# Patient Record
Sex: Female | Born: 1955
Health system: Southern US, Community
[De-identification: ages and names within clinical notes are randomized; demographics above are authoritative.]

## PROBLEM LIST (undated history)

## (undated) DIAGNOSIS — I700 Atherosclerosis of aorta: Secondary | ICD-10-CM

## (undated) DIAGNOSIS — J432 Centrilobular emphysema: Secondary | ICD-10-CM

## (undated) DIAGNOSIS — F410 Panic disorder [episodic paroxysmal anxiety] without agoraphobia: Secondary | ICD-10-CM

## (undated) DIAGNOSIS — G4733 Obstructive sleep apnea (adult) (pediatric): Secondary | ICD-10-CM

## (undated) DIAGNOSIS — M7989 Other specified soft tissue disorders: Secondary | ICD-10-CM

## (undated) DIAGNOSIS — R0602 Shortness of breath: Secondary | ICD-10-CM

## (undated) DIAGNOSIS — Z720 Tobacco use: Secondary | ICD-10-CM

## (undated) DIAGNOSIS — C50919 Malignant neoplasm of unspecified site of unspecified female breast: Secondary | ICD-10-CM

## (undated) DIAGNOSIS — I5042 Chronic combined systolic (congestive) and diastolic (congestive) heart failure: Secondary | ICD-10-CM

## (undated) DIAGNOSIS — R609 Edema, unspecified: Secondary | ICD-10-CM

## (undated) HISTORY — DX: Tobacco use: Z72.0

## (undated) HISTORY — DX: Other specified soft tissue disorders: M79.89

## (undated) HISTORY — DX: Atherosclerosis of aorta: I70.0

## (undated) HISTORY — DX: Panic disorder (episodic paroxysmal anxiety): F41.0

## (undated) HISTORY — DX: Shortness of breath: R06.02

## (undated) HISTORY — DX: Malignant neoplasm of unspecified site of unspecified female breast: C50.919

## (undated) HISTORY — DX: Centrilobular emphysema: J43.2

## (undated) HISTORY — DX: Edema, unspecified: R60.9

---

## 2005-10-13 ENCOUNTER — Ambulatory Visit: Payer: Self-pay | Admitting: Family Medicine

## 2006-05-14 ENCOUNTER — Encounter (INDEPENDENT_AMBULATORY_CARE_PROVIDER_SITE_OTHER): Payer: Self-pay | Admitting: Specialist

## 2006-05-14 ENCOUNTER — Encounter: Admission: RE | Admit: 2006-05-14 | Discharge: 2006-05-14 | Payer: Self-pay | Admitting: Obstetrics and Gynecology

## 2006-05-14 ENCOUNTER — Encounter (INDEPENDENT_AMBULATORY_CARE_PROVIDER_SITE_OTHER): Payer: Self-pay | Admitting: Radiology

## 2006-05-19 ENCOUNTER — Encounter: Admission: RE | Admit: 2006-05-19 | Discharge: 2006-05-19 | Payer: Self-pay | Admitting: Obstetrics and Gynecology

## 2006-09-22 DIAGNOSIS — C50919 Malignant neoplasm of unspecified site of unspecified female breast: Secondary | ICD-10-CM

## 2006-09-22 HISTORY — PX: BREAST LUMPECTOMY: SHX2

## 2006-09-22 HISTORY — DX: Malignant neoplasm of unspecified site of unspecified female breast: C50.919

## 2009-08-13 ENCOUNTER — Encounter (INDEPENDENT_AMBULATORY_CARE_PROVIDER_SITE_OTHER): Payer: Self-pay | Admitting: *Deleted

## 2009-09-27 ENCOUNTER — Encounter (INDEPENDENT_AMBULATORY_CARE_PROVIDER_SITE_OTHER): Payer: Self-pay | Admitting: *Deleted

## 2009-09-28 ENCOUNTER — Ambulatory Visit: Payer: Self-pay | Admitting: Gastroenterology

## 2009-10-08 ENCOUNTER — Ambulatory Visit: Payer: Self-pay | Admitting: Gastroenterology

## 2009-10-11 ENCOUNTER — Encounter: Payer: Self-pay | Admitting: Gastroenterology

## 2010-09-05 ENCOUNTER — Ambulatory Visit: Payer: Self-pay | Admitting: Internal Medicine

## 2010-09-05 DIAGNOSIS — Z860100 Personal history of colon polyps, unspecified: Secondary | ICD-10-CM | POA: Insufficient documentation

## 2010-09-05 DIAGNOSIS — F17200 Nicotine dependence, unspecified, uncomplicated: Secondary | ICD-10-CM

## 2010-09-05 DIAGNOSIS — Z720 Tobacco use: Secondary | ICD-10-CM | POA: Insufficient documentation

## 2010-09-05 DIAGNOSIS — Z853 Personal history of malignant neoplasm of breast: Secondary | ICD-10-CM | POA: Insufficient documentation

## 2010-09-05 LAB — CONVERTED CEMR LAB
ALT: 22 units/L (ref 0–35)
AST: 24 units/L (ref 0–37)
BUN: 15 mg/dL (ref 6–23)
Basophils Relative: 0.5 % (ref 0.0–3.0)
Bilirubin Urine: NEGATIVE
Bilirubin, Direct: 0.1 mg/dL (ref 0.0–0.3)
Eosinophils Relative: 0.9 % (ref 0.0–5.0)
GFR calc non Af Amer: 73.63 mL/min (ref >60.00)
HCT: 41.2 % (ref 36.0–46.0)
Ketones, ur: NEGATIVE mg/dL
LDL Cholesterol: 101 mg/dL — ABNORMAL HIGH (ref 0–99)
Lymphs Abs: 2 10*3/uL (ref 0.7–4.0)
Monocytes Relative: 9.9 % (ref 3.0–12.0)
Platelets: 192 10*3/uL (ref 150.0–400.0)
Potassium: 4.5 meq/L (ref 3.5–5.1)
RBC: 4.25 M/uL (ref 3.87–5.11)
Sodium: 143 meq/L (ref 135–145)
TSH: 0.85 microintl units/mL (ref 0.35–5.50)
Total Bilirubin: 0.9 mg/dL (ref 0.3–1.2)
Total CHOL/HDL Ratio: 3
Total Protein, Urine: NEGATIVE mg/dL
VLDL: 7.4 mg/dL (ref 0.0–40.0)
WBC: 7.2 10*3/uL (ref 4.5–10.5)
pH: 5.5 (ref 5.0–8.0)

## 2010-10-13 ENCOUNTER — Encounter: Payer: Self-pay | Admitting: Obstetrics and Gynecology

## 2010-10-22 NOTE — Letter (Signed)
Summary: [facility] Instructions  [facility]  [address] [place], [place] [address]   Phone: [phone]  Fax: [phone]       Stacey [name]    1955-12-10    MRN: [id number]        Procedure Day Dorna Bloom:  Duanne Limerick  10/08/09     Arrival Time:  8:00AM     Procedure Time:  9:00AM     Location of Procedure:                    _ X_  [facility] (4th Floor)                        PREPARATION FOR COLONOSCOPY WITH MOVIPREP   Starting 5 days prior to your procedure 10/03/09 do not eat nuts, seeds, popcorn, corn, beans, peas,  salads, or any raw vegetables.  Do not take any fiber supplements (e.g. Metamucil, Citrucel, and Benefiber).  THE DAY BEFORE YOUR PROCEDURE         DATE: 10/07/09  DAY: SUNDAY  1.  Drink clear liquids the entire day-NO SOLID FOOD  2.  Do not drink anything colored red or purple.  Avoid juices with pulp.  No orange juice.  3.  Drink at least 64 oz. (8 glasses) of fluid/clear liquids during the day to prevent dehydration and help the prep work efficiently.  CLEAR LIQUIDS INCLUDE: Water Jello Ice Popsicles Tea (sugar ok, no milk/cream) Powdered fruit flavored drinks Coffee (sugar ok, no milk/cream) Gatorade Juice: apple, white grape, white cranberry  Lemonade Clear bullion, consomm, broth Carbonated beverages (any kind) Strained chicken noodle soup Hard Candy                             4.  In the morning, mix first dose of MoviPrep solution:    Empty 1 Pouch A and 1 Pouch B into the disposable container    Add lukewarm drinking water to the top line of the container. Mix to dissolve    Refrigerate (mixed solution should be used within 24 hrs)  5.  Begin drinking the prep at 5:00 p.m. The MoviPrep container is divided by 4 marks.   Every 15 minutes drink the solution down to the next mark (approximately 8 oz) until the full liter is complete.   6.  Follow completed prep with 16 oz of clear liquid of your choice (Nothing  red or purple).  Continue to drink clear liquids until bedtime.  7.  Before going to bed, mix second dose of MoviPrep solution:    Empty 1 Pouch A and 1 Pouch B into the disposable container    Add lukewarm drinking water to the top line of the container. Mix to dissolve    Refrigerate  THE DAY OF YOUR PROCEDURE      DATE: 10/08/09  DAY: MONDAY  Beginning at 4:00AM (5 hours before procedure):         1. Every 15 minutes, drink the solution down to the next mark (approx 8 oz) until the full liter is complete.  2. Follow completed prep with 16 oz. of clear liquid of your choice.    3. You may drink clear liquids until 7:00AM (2 HOURS BEFORE PROCEDURE).   MEDICATION INSTRUCTIONS  Unless otherwise instructed, you should take regular prescription medications with a small sip of water   as early as possible the morning  of your procedure.         OTHER INSTRUCTIONS  You will need a responsible adult at least 56 years of age to accompany you and drive you home.   This person must remain in the waiting room during your procedure.  Wear loose fitting clothing that is easily removed.  Leave jewelry and other valuables at home.  However, you may wish to bring a book to read or  an iPod/MP3 player to listen to music as you wait for your procedure to start.  Remove all body piercing jewelry and leave at home.  Total time from sign-in until discharge is approximately 2-3 hours.  You should go home directly after your procedure and rest.  You can resume normal activities the  day after your procedure.  The day of your procedure you should not:   Drive   Make legal decisions   Operate machinery   Drink alcohol   Return to work  You will receive specific instructions about eating, activities and medications before you leave.    The above instructions have been reviewed and explained to me by   Wyona Almas RN  September 28, 2009 4:42 PM     I fully understand and can  verbalize these instructions _____________________________ Date _________

## 2010-10-22 NOTE — Procedures (Signed)
Summary: Colonoscopy  Patient: Stacey [name] Note: All result statuses are Final unless otherwise noted.  Tests: (1) Colonoscopy (COL)   COL Colonoscopy           DONE     [facility]     [address]     [place], [place]  [address]           COLONOSCOPY PROCEDURE REPORT           PATIENT:  Stacey [name], Stacey [name]  MR#:  [id number]     BIRTHDATE:  04-Apr-1956, 53 yrs. old  GENDER:  female           ENDOSCOPIST:  Rachael Fee, MD     Referred by:  Cornell Barman, MD           PROCEDURE DATE:  10/08/2009     PROCEDURE:  Colonoscopy with snare polypectomy     ASA CLASS:  Class II     INDICATIONS:  Routine Risk Screening           MEDICATIONS:   Fentanyl 75 mcg IV, Versed 10 mg IV           DESCRIPTION OF PROCEDURE:   After the risks benefits and     alternatives of the procedure were thoroughly explained, informed     consent was obtained.  Digital rectal exam was performed and     revealed no rectal masses.   The LB [id number] endoscope     was introduced through the anus and advanced to the cecum, which     was identified by both the appendix and ileocecal valve, without     limitations.  The quality of the prep was good, using MoviPrep.     The instrument was then slowly withdrawn as the colon was fully     examined.     <<PROCEDUREIMAGES>>           FINDINGS:  There were several small sessile, hyperplastic     appearing recto-sigmoid polyps. These were sampled with cold snare     and sent to pathology (jar 1) (see image3 and image5).  This was     otherwise a normal examination of the colon (see image7, image1,     and image2).   Retroflexed views in the rectum revealed no     abnormalities.    The scope was then withdrawn from the patient     and the procedure completed.           COMPLICATIONS:  None           ENDOSCOPIC IMPRESSION:     1) Several small hyperplastic recto-sigmoid polyps. Sampled with     polypectomy and sent to pathology.     2) Otherwise  normal examination           RECOMMENDATIONS:     1) If the polyp(s) removed today are proven to be adenomatous     (pre-cancerous) polyps, you will need a repeat colonoscopy in 5     years. Otherwise you should continue to follow colorectal cancer     screening guidelines for "routine risk" patients with colonoscopy     in 10 years.     2) You will receive a letter within 1-2 weeks with the results     of your biopsy as well as final recommendations. Please call my     office if you have not received a letter after 3 weeks.  REPEAT EXAM:  await final pathology           ______________________________     Rachael Fee, MD           n.     eSIGNED:   Rachael Fee at 10/08/2009 09:48 AM           Dumlao, Rosalita Chessman, [phone]  Note: An exclamation mark (!) indicates a result that was not dispersed into the flowsheet. Document Creation Date: 10/08/2009 9:48 AM _______________________________________________________________________  (1) Order result status: Final Collection or observation date-time: 10/08/2009 09:44 Requested date-time:  Receipt date-time:  Reported date-time:  Referring Physician:   Ordering Physician: Rob Bunting [phone]) Specimen Source:  Source: Launa Grill Order Number: [id number] Lab site:   Appended Document: Colonoscopy     Procedures Next Due Date:    Colonoscopy: 10/2019

## 2010-10-22 NOTE — Letter (Signed)
Summary: Results Letter  [facility]  [address] [place], [place] [address]   Phone: [phone]  Fax: [phone]        October 11, 2009 MRN: [id number]    Stacey [name] [address] [place], [place]  [address]    Dear Ms. Heims,   Good news.  The polyp(s) that were removed during your recent procedure were NOT pre-cancerous.  You should continue to follow current colorectal cancer screening guidelines with a repeat colonoscopy in 10 years.  We will therefore put your information in our reminder system and will contact you in 10 years to schedule a repeat procedure.  Please call if you have any questions or concerns.       Sincerely,  Rachael Fee MD  This letter has been electronically signed by your physician.  Appended Document: Results Letter Letter mailed 1.21.11

## 2010-10-22 NOTE — Miscellaneous (Signed)
Summary: [facility] Previsit/prep  Clinical Lists Changes  Medications: Added new medication of MOVIPREP 100 GM  SOLR (PEG-KCL-NACL-NASULF-NA ASC-C) As per prep instructions. - Signed Rx of MOVIPREP 100 GM  SOLR (PEG-KCL-NACL-NASULF-NA ASC-C) As per prep instructions.;  #1 x 0;  Signed;  Entered by: Wyona Almas RN;  Authorized by: Rachael Fee MD;  Method used: Electronically to [facility] [address], [place], [place], [place]  [address], Ph: [phone], Fax: [phone] Allergies: Added new allergy or adverse reaction of PENICILLIN Added new allergy or adverse reaction of AMOXICILLIN Observations: Added new observation of NKA: F (09/28/2009 15:41)    Prescriptions: MOVIPREP 100 GM  SOLR (PEG-KCL-NACL-NASULF-NA ASC-C) As per prep instructions.  #1 x 0   Entered by:   Wyona Almas RN   Authorized by:   Rachael Fee MD   Signed by:   Wyona Almas RN on 09/28/2009   Method used:   Electronically to        [facility]* (retail)       [address]       [place], [place]  [address]       Ph: [phone]       Fax: [phone]   RxID:   [id number]

## 2010-10-24 NOTE — Assessment & Plan Note (Signed)
Summary: NEW PT/REQUESTING LABS /BCBS/#/LB   Vital Signs:  Patient profile:   55 year old female Height:      68 inches (172.72 cm) Weight:      165.8 pounds (75.36 kg) BMI:     25.30 O2 Sat:      94 % on Room air Temp:     99.1 degrees F (37.28 degrees C) oral Pulse rate:   67 / minute BP sitting:   112 / 72  (left arm) Cuff size:   regular  Vitals Entered By: Orlan Leavens RMA (September 05, 2010 11:17 AM)  O2 Flow:  Room air CC: New patient Is Patient Diabetic? No Pain Assessment Patient in pain? no        Primary Care Provider:  Newt Lukes MD  CC:  New patient.  History of Present Illness: new pt to me and our practice, here to est care  patient is here today for annual physical. Patient feels well and has no complaints.   concerns however re: scar and deformity of left breast from prior lumpectomy - ?opinion of repair  also c/o cough x 3 weeks  productive in AM - yellow, thick - no fever, mild nasal congestion, no HA or CP +sick contacts, no travel - no weight loss or hemopytsis  ?calcium intake daily and vit d? ?use electric cigarette for cessation  Preventive Screening-Counseling & Management  Alcohol-Tobacco     Alcohol drinks/day: <1     Alcohol Counseling: not indicated; use of alcohol is not excessive or problematic     Smoking Status: current     Smoking Cessation Counseling: yes     Tobacco Counseling: to quit use of tobacco products  Caffeine-Diet-Exercise     Does Patient Exercise: no     Exercise Counseling: to improve exercise regimen     Depression Counseling: not indicated; screening negative for depression  Safety-Violence-Falls     Seat Belt Counseling: not indicated; patient wears seat belts     Helmet Counseling: not indicated; patient wears helmet when riding bicycle/motocycle     Firearm Counseling: not indicated; uses recommended firearm safety measures     Fall Risk Counseling: not indicated; no significant falls  noted  Clinical Review Panels:  Prevention   Last Pap Smear:  Interpretation Result:Negative for intraepithelial Lesion or Malignancy.    (09/22/2009)   Last Colonoscopy:  DONE (10/08/2009)   Current Medications (verified): 1)  Centrum Silver  Tabs (Multiple Vitamins-Minerals) .... Take 1 By Mouth Once Daily 2)  Vitamin D3 2000 Unit Caps (Cholecalciferol) .... Take 1 By Mouth Once Daily 3)  Aspirin 325 Mg Tabs (Aspirin) .... Take 1 By Mouth Once Daily  Allergies (verified): 1)  ! Penicillin 2)  ! 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 I have reviewed all other systems and they were negative.   Physical Exam  General:  alert, well-developed, well-nourished, and cooperative to examination.    Head:  Normocephalic and atraumatic without obvious abnormalities. No apparent alopecia or balding. Eyes:  vision grossly intact; pupils equal, round and reactive to light.  conjunctiva and lids normal.    Ears:  normal pinnae bilaterally, without erythema, swelling, or tenderness to palpation. TMs clear, without effusion, or cerumen impaction. Hearing grossly normal bilaterally  Mouth:  teeth and gums in good repair; mucous membranes moist, without lesions or ulcers. oropharynx clear without exudate, no erythema.  Neck:   supple, full ROM, no masses, no thyromegaly; no thyroid nodules or tenderness. no JVD or carotid bruits.   Breasts:  indention/deficit scar left breast approx 11o'clock from prior lumpectomy Lungs:  normal respiratory effort, no intercostal retractions or use of accessory muscles; few rhonchl breath sounds bilaterally - no crackles and no wheezes.    Heart:  normal rate, regular rhythm, no murmur, and no rub. BLE without edema. Abdomen:  soft, non-tender, normal bowel sounds, no distention; no masses and no appreciable hepatomegaly or splenomegaly.   Genitalia:  defer to gyn Msk:  No deformity or scoliosis noted of thoracic or lumbar spine.   Neurologic:  alert & oriented X3 and cranial nerves II-XII symetrically intact.  strength normal in all extremities, sensation intact to light touch, and gait normal. speech fluent without dysarthria or aphasia; follows commands with good comprehension.  Skin:  no rashes, vesicles, ulcers, or erythema. No nodules or irregularity to palpation.  Psych:  Oriented X3, memory intact for recent and remote, normally interactive, good eye contact, not anxious appearing, not depressed appearing, and not agitated.      Impression & Recommendations:  Problem # 1:  PREVENTIVE HEALTH CARE (ICD-V70.0) Patient has been counseled on age-appropriate routine health concerns for screening and prevention. These are reviewed and up-to-date. Immunizations are up-to-date or declined. Labs ordered and will be reviewed.  Orders: TLB-Lipid Panel (80061-LIPID) TLB-BMP (Basic Metabolic Panel-BMET) (80048-METABOL) TLB-CBC Platelet - w/Differential (85025-CBCD) TLB-Hepatic/Liver Function Pnl (80076-HEPATIC) TLB-TSH (Thyroid Stimulating Hormone) (84443-TSH) TLB-Udip w/ Micro (81001-URINE)  Problem # 2:  ACUTE BRONCHITIS (ICD-466.0)  Her updated medication list for this problem includes:    Azithromycin 250 Mg Tabs (Azithromycin) .Marland Kitchen... 2 tabs by mouth today, then 1 by mouth daily  starting tomorrow  Take antibiotics and other medications as directed. Encouraged to push clear liquids, get enough rest, and take acetaminophen as needed. To be seen in 5-7 days if no improvement, sooner if worse.  Orders: Prescription Created Electronically [phone]) Tobacco use cessation intermediate 3-10 minutes (30865)  Problem # 3:  ADENOCARCINOMA, LEFT BREAST (ICD-174.9) dx 2008 - s/p lumpectomy, chemo and xrt no recurrence on MRI breast 05/2010 - pt concerned about cosmetic scar - given name for plastics surg but uncertain if anything can be done given cancer hx  Problem # 4:  SMOKER (ICD-305.1)  5 minutes today spent on patient education regarding the unhealthy effects of continued tobacco abuse and encouragment of cessation including medical options available to help patient to quit smoking.   Orders: Tobacco use cessation intermediate 3-10 minutes (78469)  Complete Medication List: 1)  Centrum Silver Tabs (Multiple vitamins-minerals) .... Take 1 by mouth once daily 2)  Vitamin D3 2000 Unit Caps (Cholecalciferol) .... Take 1 by mouth once daily 3)  Aspirin 325 Mg Tabs (Aspirin) .... Take 1 by mouth once daily 4)  Valium 5 Mg Tabs (Diazepam) .... 1/2-1 by mouth once daily as needed (per onc) 5)  Caltrate 600 1500 Mg Tabs (  Calcium carbonate) .... 2 by mouth once daily 6)  Azithromycin 250 Mg Tabs (Azithromycin) .... 2 tabs by mouth today, then 1 by mouth daily starting tomorrow  Patient Instructions: 1)  it was good to see you today. 2)  test(s) ordered today - your results will be posted on the phone tree for review in 48-72 hours from the time of test completion; call [phone] and enter your 9 digit MRN (listed above on this page, just below your name); if any changes need to be made or there are abnormal results, you will be contacted directly. 3)  Zpak antibiotics in addition to your cough syrup as discussed -  4)  your prescriptions have been electronically submitted to  your pharmacy. Please take as directed. Contact our office if you believe you're having problems with the medication(s).  5)  Tobacco is very bad for your health and your loved ones! You Should stop smoking!. 6)  Take calcium 1200-1600mg /d  +Vitamin D 1000-2000 units daily. 7)  Call Dr. Benna Dunks [phone] for opinion on your breast concerns 8)  Please schedule a follow-up appointment in annually for your medical physical and labs, call sooner if problems.  Prescriptions: AZITHROMYCIN 250 MG TABS (AZITHROMYCIN) 2 tabs by mouth today, then 1 by mouth daily starting tomorrow  #6 x 0   Entered and Authorized by:   Newt Lukes MD   Signed by:   Newt Lukes MD on 09/05/2010   Method used:   Electronically to        [facility]* (retail)       [address]       [place], [place]  [address]       Ph: [phone]       Fax: [phone]   RxID:   [id number]    Orders Added: 1)  TLB-Lipid Panel [80061-LIPID] 2)  TLB-BMP (Basic Metabolic Panel-BMET) [80048-METABOL] 3)  TLB-CBC Platelet - w/Differential [85025-CBCD] 4)  TLB-Hepatic/Liver Function Pnl [80076-HEPATIC] 5)  TLB-TSH (Thyroid Stimulating Hormone) [84443-TSH] 6)  TLB-Udip w/ Micro [81001-URINE] 7)  Prescription Created Electronically [G8553] 8)  Tobacco use cessation intermediate 3-10 minutes [99406] 9)  New Patient 40-64 years [99386] 6)  New Patient Level II [99202]   Immunization History:  Tetanus/Td Immunization History:    Tetanus/Td:  historical (09/22/2008)   Immunization History:  Tetanus/Td Immunization History:    Tetanus/Td:  Historical (09/22/2008)    Pap Smear  Procedure date:  09/22/2009  Findings:      Interpretation Result:Negative for intraepithelial Lesion or Malignancy.

## 2011-06-20 ENCOUNTER — Ambulatory Visit: Payer: Self-pay | Admitting: Internal Medicine

## 2011-06-20 DIAGNOSIS — Z0289 Encounter for other administrative examinations: Secondary | ICD-10-CM

## 2012-06-16 ENCOUNTER — Ambulatory Visit (INDEPENDENT_AMBULATORY_CARE_PROVIDER_SITE_OTHER)
Admission: RE | Admit: 2012-06-16 | Discharge: 2012-06-16 | Disposition: A | Discharge status: Home / Self Care | Payer: BC Managed Care – PPO | Source: Ambulatory Visit | Attending: Internal Medicine | Admitting: Internal Medicine

## 2012-06-16 ENCOUNTER — Ambulatory Visit (INDEPENDENT_AMBULATORY_CARE_PROVIDER_SITE_OTHER): Payer: BC Managed Care – PPO | Admitting: Internal Medicine

## 2012-06-16 ENCOUNTER — Encounter: Payer: Self-pay | Admitting: Internal Medicine

## 2012-06-16 VITALS — BP 120/80 | HR 100 | Temp 97.2°F | Resp 17 | Wt 177.5 lb

## 2012-06-16 DIAGNOSIS — J309 Allergic rhinitis, unspecified: Secondary | ICD-10-CM

## 2012-06-16 DIAGNOSIS — F17200 Nicotine dependence, unspecified, uncomplicated: Secondary | ICD-10-CM

## 2012-06-16 DIAGNOSIS — J209 Acute bronchitis, unspecified: Secondary | ICD-10-CM

## 2012-06-16 MED ORDER — BENZONATATE 200 MG PO CAPS: 200.0000 mg | ORAL_CAPSULE | Freq: Three times a day (TID) | ORAL | Status: DC | PRN

## 2012-06-16 MED ORDER — LEVOFLOXACIN 500 MG PO TABS: 500.0000 mg | ORAL_TABLET | Freq: Every day | ORAL | Status: DC

## 2012-06-16 MED ORDER — LORATADINE 10 MG PO TABS: 10.0000 mg | ORAL_TABLET | Freq: Every day | ORAL | Status: DC

## 2012-06-16 MED ORDER — METHYLPREDNISOLONE ACETATE 80 MG/ML IJ SUSP
120.0000 mg | Freq: Once | INTRAMUSCULAR | Status: AC
  Administered 2012-06-16: 120 mg via INTRAMUSCULAR

## 2012-06-16 MED ORDER — PHENYLEPH-PROMETHAZINE-COD 5-6.25-10 MG/5ML PO SYRP: 5.0000 mL | ORAL_SOLUTION | Freq: Four times a day (QID) | ORAL | Status: DC | PRN

## 2012-06-16 NOTE — Progress Notes (Signed)
  Subjective:    Patient ID: Stacey [name], female    DOB: Jun 25, 1956, 56 y.o.   MRN: [id number]  Cough This is a new problem. The current episode started 1 to 4 weeks ago. The problem has been unchanged. The problem occurs every few minutes. The cough is productive of sputum. Associated symptoms include headaches, myalgias, nasal congestion, postnasal drip and wheezing. Pertinent negatives include no chest pain, ear congestion, ear pain, fever, rash, sore throat, shortness of breath or weight loss. The symptoms are aggravated by lying down. Risk factors for lung disease include smoking/tobacco exposure. She has tried prescription cough suppressant and OTC cough suppressant for the symptoms. The treatment provided mild relief. Her past medical history is significant for environmental allergies.   Past Medical History  Diagnosis Date  . Breast cancer 2008    L side, s/p lumpectomy, chemotherapy and radiation    Review of Systems  Constitutional: Negative for fever and weight loss.  HENT: Positive for postnasal drip. Negative for ear pain and sore throat.   Respiratory: Positive for cough and wheezing. Negative for shortness of breath.   Cardiovascular: Negative for chest pain.  Musculoskeletal: Positive for myalgias.  Skin: Negative for rash.  Neurological: Positive for headaches.  Hematological: Positive for environmental allergies.       Objective:   Physical Exam BP 120/80  Pulse 100  Temp 97.2 F (36.2 C) (Oral)  Resp 17  Wt 177 lb 8 oz (80.513 kg)  SpO2 92% Wt Readings from Last 3 Encounters:  06/16/12 177 lb 8 oz (80.513 kg)  09/05/10 165 lb 12.8 oz (75.206 kg)   Constitutional: She has coughing spasms, but appears well-developed and well-nourished. No distress.  Neck: Normal range of motion. Neck supple. No JVD present. No thyromegaly present.  Cardiovascular: Normal rate, regular rhythm and normal heart sounds.  No murmur heard. No BLE edema. Pulmonary/Chest: B rhonchi  -Effort normal between cough spells. No resting distress. She has mild exp wheezes.  Neurological: She is alert and oriented to person, place, and time. No cranial nerve deficit. Coordination normal.  Skin: Skin is warm and dry. No rash noted. No erythema.  Psychiatric: She has a normal mood and affect. Her behavior is normal. Judgment and thought content normal.   Lab Results  Component Value Date   WBC 7.2 09/05/2010   HGB 14.1 09/05/2010   HCT 41.2 09/05/2010   PLT 192.0 09/05/2010   GLUCOSE 81 09/05/2010   CHOL 177 09/05/2010   TRIG 37.0 09/05/2010   HDL 68.60 09/05/2010   LDLCALC 101* 09/05/2010   ALT 22 09/05/2010   AST 24 09/05/2010   NA 143 09/05/2010   K 4.5 09/05/2010   CL 103 09/05/2010   CREATININE 0.9 09/05/2010   BUN 15 09/05/2010   CO2 30 09/05/2010   TSH 0.85 09/05/2010        Assessment & Plan:  Acute bronchitis with bronchspasm Smoker allergic rhinitis   Check CXR r/o PNA IM medrol Change antibiotics to Levaquin Change cough medication to tessalon and prometh vc with codiene Start claritin and stop smoking 5 minutes today spent counseling patient on unhealthy effects of continued tobacco abuse and encouragement of cessation including medical options available to help the patient quit smoking.

## 2012-06-16 NOTE — Addendum Note (Signed)
Addended by: Jackson Latino on: 06/16/2012 03:52 PM   Modules accepted: Orders

## 2012-06-16 NOTE — Patient Instructions (Addendum)
It was good to see you today. IM medrol shot for inflammation today Test(s) ordered today. Your results will be released to MyChart (or called to you) after review, usually within 72hours after test completion. If any changes need to be made, you will be notified at that same time. Stop Zpak and start Levaquin antibiotics one pill daily x 7 days Use promethazine with codeine syrup for cough and congestion along with Tessalon perles 3 times a day for cough suppression Continue over-the-counter medications for cough and allergiesAcute Bronchitis Bronchitis is when the organs and tissues involved in breathing get puffy (swollen) and can leak fluid. This makes it harder for air to get in and out of the lungs. You may cough a lot and produce thick spit (mucus). Acute means the illness started suddenly. HOME CARE  Rest.   Drink enough fluids to keep the pee (urine) clear or pale yellow.   Medicines may be given that will open up your airways to help you breathe better. Only take medicine as told by your doctor.   Use a cool mist vaporizer. This will help to thin any thick spit.   Do not smoke. Avoid secondhand smoke.  GET HELP RIGHT AWAY IF:    You have a temperature by mouth above 102 F (38.9 C), not controlled by medicine.   You have chills.   You develop severe shortness of breath or chest pain.   You have bloody spit mixed with mucus (sputum).   You throw up (vomit) often.   You lose too much body fluid (dehydrated).   You have a severe headache.   You feel faint.   You do not improve after 1 week of treatment.  MAKE SURE YOU:    Understand these instructions.   Will watch your condition.   Will get help right away if you are not doing well or get worse.  Document Released: 02/25/2008 Document Revised: 08/28/2011 Document Reviewed: 09/26/2009 [facility] Patient Information 2012 [place], [place].Smoking Cessation, Tips for Success YOU CAN QUIT SMOKING If you are ready to  quit smoking, congratulations! You have chosen to help yourself be healthier. Cigarettes bring nicotine, tar, carbon monoxide, and other irritants into your body. Your lungs, heart, and blood vessels will be able to work better without these poisons. There are many different ways to quit smoking. Nicotine gum, nicotine patches, a nicotine inhaler, or nicotine nasal spray can help with physical craving. Hypnosis, support groups, and medicines help break the habit of smoking. Here are some tips to help you quit for good.  Throw away all cigarettes.   Clean and remove all ashtrays from your home, work, and car.   On a card, write down your reasons for quitting. Carry the card with you and read it when you get the urge to smoke.   Cleanse your body of nicotine. Drink enough water and fluids to keep your urine clear or pale yellow. Do this after quitting to flush the nicotine from your body.   Learn to predict your moods. Do not let a bad situation be your excuse to have a cigarette. Some situations in your life might tempt you into wanting a cigarette.   Never have "just one" cigarette. It leads to wanting another and another. Remind yourself of your decision to quit.   Change habits associated with smoking. If you smoked while driving or when feeling stressed, try other activities to replace smoking. Stand up when drinking your coffee. Brush your teeth after eating. Sit in  a different chair when you read the paper. Avoid alcohol while trying to quit, and try to drink fewer caffeinated beverages. Alcohol and caffeine may urge you to smoke.   Avoid foods and drinks that can trigger a desire to smoke, such as sugary or spicy foods and alcohol.   Ask people who smoke not to smoke around you.   Have something planned to do right after eating or having a cup of coffee. Take a walk or exercise to perk you up. This will help to keep you from overeating.   Try a relaxation exercise to calm you down and  decrease your stress. Remember, you may be tense and nervous for the first 2 weeks after you quit, but this will pass.   Find new activities to keep your hands busy. Play with a pen, coin, or rubber band. Doodle or draw things on paper.   Brush your teeth right after eating. This will help cut down on the craving for the taste of tobacco after meals. You can try mouthwash, too.   Use oral substitutes, such as lemon drops, carrots, a cinnamon stick, or chewing gum, in place of cigarettes. Keep them handy so they are available when you have the urge to smoke.   When you have the urge to smoke, try deep breathing.   Designate your home as a nonsmoking area.   If you are a heavy smoker, ask your caregiver about a prescription for nicotine chewing gum. It can ease your withdrawal from nicotine.   Reward yourself. Set aside the cigarette money you save and buy yourself something nice.   Look for support from others. Join a support group or smoking cessation program. Ask someone at home or at work to help you with your plan to quit smoking.   Always ask yourself, "Do I need this cigarette or is this just a reflex?" Tell yourself, "Today, I choose not to smoke," or "I do not want to smoke." You are reminding yourself of your decision to quit, even if you do smoke a cigarette.  HOW WILL I FEEL WHEN I QUIT SMOKING?  The benefits of not smoking start within days of quitting.   You may have symptoms of withdrawal because your body is used to nicotine (the addictive substance in cigarettes). You may crave cigarettes, be irritable, feel very hungry, cough often, get headaches, or have difficulty concentrating.   The withdrawal symptoms are only temporary. They are strongest when you first quit but will go away within 10 to 14 days.   When withdrawal symptoms occur, stay in control. Think about your reasons for quitting. Remind yourself that these are signs that your body is healing and getting used to  being without cigarettes.   Remember that withdrawal symptoms are easier to treat than the major diseases that smoking can cause.   Even after the withdrawal is over, expect periodic urges to smoke. However, these cravings are generally short-lived and will go away whether you smoke or not. Do not smoke!   If you relapse and smoke again, do not lose hope. Most smokers quit 3 times before they are successful.   If you relapse, do not give up! Plan ahead and think about what you will do the next time you get the urge to smoke.  LIFE AS A NONSMOKER: MAKE IT FOR A MONTH, MAKE IT FOR LIFE Day 1: Hang this page where you will see it every day. Day 2: Get rid of all ashtrays, matches,  and lighters. Day 3: Drink water. Breathe deeply between sips. Day 4: Avoid places with smoke-filled air, such as bars, clubs, or the smoking section of restaurants. Day 5: Keep track of how much money you save by not smoking. Day 6: Avoid boredom. Keep a good book with you or go to the movies. Day 7: Reward yourself! One week without smoking! Day 8: Make a dental appointment to get your teeth cleaned. Day 9: Decide how you will turn down a cigarette before it is offered to you. Day 10: Review your reasons for quitting. Day 11: Distract yourself. Stay active to keep your mind off smoking and to relieve tension. Take a walk, exercise, read a book, do a crossword puzzle, or try a new hobby. Day 12: Exercise. Get off the bus before your stop or use stairs instead of escalators. Day 13: Call on friends for support and encouragement. Day 14: Reward yourself! Two weeks without smoking! Day 15: Practice deep breathing exercises. Day 16: Bet a friend that you can stay a nonsmoker. Day 17: Ask to sit in nonsmoking sections of restaurants. Day 18: Hang up "No Smoking" signs. Day 19: Think of yourself as a nonsmoker. Day 20: Each morning, tell yourself you will not smoke. Day 21: Reward yourself! Three weeks without  smoking! Day 22: Think of smoking in negative ways. Remember how it stains your teeth, gives you bad breath, and leaves you short of breath. Day 23: Eat a nutritious breakfast. Day 24:Do not relive your days as a smoker. Day 25: Hold a pencil in your hand when talking on the telephone. Day 26: Tell all your friends you do not smoke. Day 27: Think about how much better food tastes. Day 28: Remember, one cigarette is one too many. Day 29: Take up a hobby that will keep your hands busy. Day 30: Congratulations! One month without smoking! Give yourself a big reward. Your caregiver can direct you to community resources or hospitals for support, which may include:  Group support.   Education.   Hypnosis.   Subliminal therapy.  Document Released: 06/06/2004 Document Revised: 08/28/2011 Document Reviewed: 06/25/2009 [facility] Patient Information 2012 [place], [place].

## 2013-03-14 ENCOUNTER — Ambulatory Visit (INDEPENDENT_AMBULATORY_CARE_PROVIDER_SITE_OTHER): Payer: BC Managed Care – PPO | Admitting: Internal Medicine

## 2013-03-14 ENCOUNTER — Encounter: Payer: Self-pay | Admitting: Internal Medicine

## 2013-03-14 ENCOUNTER — Other Ambulatory Visit (INDEPENDENT_AMBULATORY_CARE_PROVIDER_SITE_OTHER): Payer: BC Managed Care – PPO

## 2013-03-14 VITALS — BP 142/70 | HR 81 | Temp 98.0°F | Wt 182.6 lb

## 2013-03-14 DIAGNOSIS — Z0000 Encounter for general adult medical examination without abnormal findings: Secondary | ICD-10-CM

## 2013-03-14 LAB — HEPATIC FUNCTION PANEL
ALT: 23 U/L (ref 0–35)
AST: 26 U/L (ref 0–37)
Albumin: 3.7 g/dL (ref 3.5–5.2)

## 2013-03-14 LAB — CBC WITH DIFFERENTIAL/PLATELET
Basophils Absolute: 0 10*3/uL (ref 0.0–0.1)
Basophils Relative: 0.2 % (ref 0.0–3.0)
Hemoglobin: 14.2 g/dL (ref 12.0–15.0)
Lymphocytes Relative: 18.6 % (ref 12.0–46.0)
Monocytes Relative: 9 % (ref 3.0–12.0)
Neutro Abs: 4.7 10*3/uL (ref 1.4–7.7)
RBC: 4.32 Mil/uL (ref 3.87–5.11)
RDW: 13.9 % (ref 11.5–14.6)

## 2013-03-14 LAB — URINALYSIS, ROUTINE W REFLEX MICROSCOPIC
Ketones, ur: NEGATIVE
Leukocytes, UA: NEGATIVE
Nitrite: NEGATIVE
Specific Gravity, Urine: <=1.005 (ref 1.000–1.030)
WBC, UA: NONE SEEN (ref 0–?)
pH: 6 (ref 5.0–8.0)

## 2013-03-14 LAB — TSH: TSH: 1.43 u[IU]/mL (ref 0.35–5.50)

## 2013-03-14 LAB — LIPID PANEL
HDL: 84 mg/dL (ref >39.00)
Triglycerides: 49 mg/dL (ref 0.0–149.0)

## 2013-03-14 LAB — BASIC METABOLIC PANEL
Calcium: 9.1 mg/dL (ref 8.4–10.5)
GFR: 68.31 mL/min (ref >60.00)
Sodium: 143 mEq/L (ref 135–145)

## 2013-03-14 MED ORDER — DIAZEPAM 5 MG PO TABS: 5.0000 mg | ORAL_TABLET | Freq: Four times a day (QID) | ORAL | Status: DC | PRN

## 2013-03-14 NOTE — Progress Notes (Signed)
Subjective:    Patient ID: Stacey [name], female    DOB: Jan 18, 1956, 57 y.o.   MRN: [id number]  HPI  Patient presents today for a well visit. Reports having not been seen by a PCP for several years and wishes to touch base and have her lab work evaluated.  Reports minor complaints of fluid "pockets" along the lateral tibia which are non bothersome.  Reports slow skin healing on forearms which she has noticed over the last year.   Past Medical History  Diagnosis Date  . Breast cancer 2008    L side, s/p lumpectomy, chemotherapy and radiation   Family History  Problem Relation Age of Onset  . Diabetes     History  Substance Use Topics  . Smoking status: Never Smoker   . Smokeless tobacco: Not on file  . Alcohol Use: Yes    Review of Systems Constitutional: Negative for fever or weight change.  Respiratory: Negative for cough and shortness of breath.   Cardiovascular: Negative for chest pain or palpitations.  Gastrointestinal: Negative for abdominal pain, no bowel changes.  Musculoskeletal: Negative for gait problem or joint swelling.  Skin: Negative for rash.  Neurological: Negative for dizziness or headache.  No other specific complaints in a complete review of systems (except as listed in HPI above).         Drug Allergies:  Allergies  Allergen Reactions  . Amoxicillin     REACTION: rash  . Penicillins     REACTION: rash     Objective:   Physical Exam BP 142/70  Pulse 81  Temp(Src) 98 F (36.7 C) (Oral)  Wt 182 lb 9.6 oz (82.827 kg)  BMI 27.77 kg/m2  SpO2 96% Wt Readings from Last 3 Encounters:  03/14/13 182 lb 9.6 oz (82.827 kg)  06/16/12 177 lb 8 oz (80.513 kg)  09/05/10 165 lb 12.8 oz (75.206 kg)   Constitutional: She appears well-developed and well-nourished. No distress.  HENT: Head: Normocephalic and atraumatic. Ears: B TMs ok, no erythema or effusion; Nose: Nose normal. Mouth/Throat: Oropharynx is clear and moist. No oropharyngeal exudate.   Eyes: Conjunctivae and EOM are normal. Pupils are equal, round, and reactive to light. No scleral icterus.  Neck: Normal range of motion. Neck supple. No JVD present. No thyromegaly present.  Cardiovascular: Normal rate, regular rhythm and normal heart sounds.  No murmur heard. No BLE edema. Mild varicose veins distal BLE Pulmonary/Chest: Effort normal and breath sounds normal. No respiratory distress. She has no wheezes.  Abdominal: Soft. Bowel sounds are normal. She exhibits no distension. There is no tenderness. no masses Musculoskeletal: Normal range of motion, no joint effusions. No gross deformities Neurological: She is alert and oriented to person, place, and time. No cranial nerve deficit. Coordination, balance, strength, speech and gait are normal.  Skin: Skin is warm and dry. No rash noted. No erythema.  Psychiatric: She has a normal mood and affect. Her behavior is normal. Judgment and thought content normal.   Lab Results  Component Value Date   WBC 6.5 03/14/2013   HGB 14.2 03/14/2013   HCT 41.8 03/14/2013   PLT 190.0 03/14/2013   GLUCOSE 118* 03/14/2013   CHOL 188 03/14/2013   TRIG 49.0 03/14/2013   HDL 84.00 03/14/2013   LDLCALC 94 03/14/2013   ALT 23 03/14/2013   AST 26 03/14/2013   NA 143 03/14/2013   K 4.5 03/14/2013   CL 109 03/14/2013   CREATININE 0.9 03/14/2013   BUN 17 03/14/2013   CO2  28 03/14/2013   TSH 1.43 03/14/2013       Assessment & Plan:  CPX/v70.0 - Patient has been counseled on age-appropriate routine health concerns for screening and prevention. These are reviewed and up-to-date. Immunizations are up-to-date or declined. Labs ordered and reviewed.  Overweight - The patient is asked to make an attempt to improve diet and exercise patterns to aid in medical management of this problem.    Patient appears to be doing well at today's visit. Encouraged healthy weight loss ideas like moderate aerobic exercises and healthy eating. Educated that "slow healing" is  likely due to aging, and recommended a vitamin E cream. Will assess annual labs for any abnormalities. Refilled Diazepam prescription and sent to pharmacy. Advised to follow up yearly unless needed sooner.

## 2013-03-14 NOTE — Patient Instructions (Signed)
It was good to see you today. We have reviewed your prior records including labs and tests today Health Maintenance reviewed - all recommended immunizations and age-appropriate screenings are up-to-date. Test(s) ordered today. Your results will be released to MyChart (or called to you) after review, usually within 72hours after test completion. If any changes need to be made, you will be notified at that same time. Medications reviewed and updated, no changes recommended at this time. Refill on medication(s) as discussed today. Please schedule followup in 6-12 months, call sooner if problems.  Health Maintenance, Females A healthy lifestyle and preventative care can promote health and wellness.  Maintain regular health, dental, and eye exams.  Eat a healthy diet. Foods like vegetables, fruits, whole grains, low-fat dairy products, and lean protein foods contain the nutrients you need without too many calories. Decrease your intake of foods high in solid fats, added sugars, and salt. Get information about a proper diet from your caregiver, if necessary.  Regular physical exercise is one of the most important things you can do for your health. Most adults should get at least 150 minutes of moderate-intensity exercise (any activity that increases your heart rate and causes you to sweat) each week. In addition, most adults need muscle-strengthening exercises on 2 or more days a week.   Maintain a healthy weight. The body mass index (BMI) is a screening tool to identify possible weight problems. It provides an estimate of body fat based on height and weight. Your caregiver can help determine your BMI, and can help you achieve or maintain a healthy weight. For adults 20 years and older:  A BMI below 18.5 is considered underweight.  A BMI of 18.5 to 24.9 is normal.  A BMI of 25 to 29.9 is considered overweight.  A BMI of 30 and above is considered obese.  Maintain normal blood lipids and  cholesterol by exercising and minimizing your intake of saturated fat. Eat a balanced diet with plenty of fruits and vegetables. Blood tests for lipids and cholesterol should begin at age 61 and be repeated every 5 years. If your lipid or cholesterol levels are high, you are over 50, or you are a high risk for heart disease, you may need your cholesterol levels checked more frequently.Ongoing high lipid and cholesterol levels should be treated with medicines if diet and exercise are not effective.  If you smoke, find out from your caregiver how to quit. If you do not use tobacco, do not start.  If you are pregnant, do not drink alcohol. If you are breastfeeding, be very cautious about drinking alcohol. If you are not pregnant and choose to drink alcohol, do not exceed 1 drink per day. One drink is considered to be 12 ounces (355 mL) of beer, 5 ounces (148 mL) of wine, or 1.5 ounces (44 mL) of liquor.  Avoid use of street drugs. Do not share needles with anyone. Ask for help if you need support or instructions about stopping the use of drugs.  High blood pressure causes heart disease and increases the risk of stroke. Blood pressure should be checked at least every 1 to 2 years. Ongoing high blood pressure should be treated with medicines, if weight loss and exercise are not effective.  If you are 41 to 58 years old, ask your caregiver if you should take aspirin to prevent strokes.  Diabetes screening involves taking a blood sample to check your fasting blood sugar level. This should be done once every 3 years,  after age 27, if you are within normal weight and without risk factors for diabetes. Testing should be considered at a younger age or be carried out more frequently if you are overweight and have at least 1 risk factor for diabetes.  Breast cancer screening is essential preventative care for women. You should practice "breast self-awareness." This means understanding the normal appearance and  feel of your breasts and may include breast self-examination. Any changes detected, no matter how small, should be reported to a caregiver. Women in their 47s and 30s should have a clinical breast exam (CBE) by a caregiver as part of a regular health exam every 1 to 3 years. After age 44, women should have a CBE every year. Starting at age 42, women should consider having a mammogram (breast X-ray) every year. Women who have a family history of breast cancer should talk to their caregiver about genetic screening. Women at a high risk of breast cancer should talk to their caregiver about having an MRI and a mammogram every year.  The Pap test is a screening test for cervical cancer. Women should have a Pap test starting at age 42. Between ages 50 and 60, Pap tests should be repeated every 2 years. Beginning at age 32, you should have a Pap test every 3 years as long as the past 3 Pap tests have been normal. If you had a hysterectomy for a problem that was not cancer or a condition that could lead to cancer, then you no longer need Pap tests. If you are between ages 37 and 60, and you have had normal Pap tests going back 10 years, you no longer need Pap tests. If you have had past treatment for cervical cancer or a condition that could lead to cancer, you need Pap tests and screening for cancer for at least 20 years after your treatment. If Pap tests have been discontinued, risk factors (such as a new sexual partner) need to be reassessed to determine if screening should be resumed. Some women have medical problems that increase the chance of getting cervical cancer. In these cases, your caregiver may recommend more frequent screening and Pap tests.  The human papillomavirus (HPV) test is an additional test that may be used for cervical cancer screening. The HPV test looks for the virus that can cause the cell changes on the cervix. The cells collected during the Pap test can be tested for HPV. The HPV test could  be used to screen women aged 42 years and older, and should be used in women of any age who have unclear Pap test results. After the age of 44, women should have HPV testing at the same frequency as a Pap test.  Colorectal cancer can be detected and often prevented. Most routine colorectal cancer screening begins at the age of 27 and continues through age 80. However, your caregiver may recommend screening at an earlier age if you have risk factors for colon cancer. On a yearly basis, your caregiver may provide home test kits to check for hidden blood in the stool. Use of a small camera at the end of a tube, to directly examine the colon (sigmoidoscopy or colonoscopy), can detect the earliest forms of colorectal cancer. Talk to your caregiver about this at age 64, when routine screening begins. Direct examination of the colon should be repeated every 5 to 10 years through age 53, unless early forms of pre-cancerous polyps or small growths are found.  Hepatitis C blood testing  is recommended for all people born from [birth year] through 1966 and any individual with known risks for hepatitis C.  Practice safe sex. Use condoms and avoid high-risk sexual practices to reduce the spread of sexually transmitted infections (STIs). Sexually active women aged 50 and younger should be checked for Chlamydia, which is a common sexually transmitted infection. Older women with new or multiple partners should also be tested for Chlamydia. Testing for other STIs is recommended if you are sexually active and at increased risk.  Osteoporosis is a disease in which the bones lose minerals and strength with aging. This can result in serious bone fractures. The risk of osteoporosis can be identified using a bone density scan. Women ages 31 and over and women at risk for fractures or osteoporosis should discuss screening with their caregivers. Ask your caregiver whether you should be taking a calcium supplement or vitamin D to reduce the  rate of osteoporosis.  Menopause can be associated with physical symptoms and risks. Hormone replacement therapy is available to decrease symptoms and risks. You should talk to your caregiver about whether hormone replacement therapy is right for you.  Use sunscreen with a sun protection factor (SPF) of 30 or greater. Apply sunscreen liberally and repeatedly throughout the day. You should seek shade when your shadow is shorter than you. Protect yourself by wearing long sleeves, pants, a wide-brimmed hat, and sunglasses year round, whenever you are outdoors.  Notify your caregiver of new moles or changes in moles, especially if there is a change in shape or color. Also notify your caregiver if a mole is larger than the size of a pencil eraser.  Stay current with your immunizations. Document Released: 03/24/2011 Document Revised: 12/01/2011 Document Reviewed: 03/24/2011 [facility] Patient Information 2014 [place], [place].

## 2013-11-15 ENCOUNTER — Ambulatory Visit: Payer: BC Managed Care – PPO | Admitting: Internal Medicine

## 2013-12-12 ENCOUNTER — Other Ambulatory Visit: Payer: Self-pay | Admitting: *Deleted

## 2013-12-12 MED ORDER — DIAZEPAM 5 MG PO TABS: 5.0000 mg | ORAL_TABLET | Freq: Four times a day (QID) | ORAL | Status: DC | PRN

## 2013-12-12 NOTE — Telephone Encounter (Signed)
Faxed script back to [facility]...Johny Chess

## 2014-01-20 ENCOUNTER — Ambulatory Visit: Payer: BC Managed Care – PPO | Admitting: Internal Medicine

## 2015-01-23 ENCOUNTER — Encounter: Payer: Self-pay | Admitting: Internal Medicine

## 2015-01-23 ENCOUNTER — Ambulatory Visit (INDEPENDENT_AMBULATORY_CARE_PROVIDER_SITE_OTHER): Payer: BLUE CROSS/BLUE SHIELD | Admitting: Internal Medicine

## 2015-01-23 ENCOUNTER — Other Ambulatory Visit (INDEPENDENT_AMBULATORY_CARE_PROVIDER_SITE_OTHER): Payer: BLUE CROSS/BLUE SHIELD

## 2015-01-23 ENCOUNTER — Ambulatory Visit (INDEPENDENT_AMBULATORY_CARE_PROVIDER_SITE_OTHER)
Admission: RE | Admit: 2015-01-23 | Discharge: 2015-01-23 | Disposition: A | Discharge status: Home / Self Care | Payer: BLUE CROSS/BLUE SHIELD | Source: Ambulatory Visit | Attending: Internal Medicine | Admitting: Internal Medicine

## 2015-01-23 VITALS — BP 118/82 | HR 95 | Temp 98.7°F | Ht 68.0 in | Wt 178.0 lb

## 2015-01-23 DIAGNOSIS — R059 Cough, unspecified: Secondary | ICD-10-CM

## 2015-01-23 DIAGNOSIS — M2550 Pain in unspecified joint: Secondary | ICD-10-CM

## 2015-01-23 DIAGNOSIS — F4322 Adjustment disorder with anxiety: Secondary | ICD-10-CM

## 2015-01-23 DIAGNOSIS — J3089 Other allergic rhinitis: Secondary | ICD-10-CM

## 2015-01-23 DIAGNOSIS — R05 Cough: Secondary | ICD-10-CM

## 2015-01-23 LAB — CBC WITH DIFFERENTIAL/PLATELET
BASOS PCT: 0.4 % (ref 0.0–3.0)
Basophils Absolute: 0 10*3/uL (ref 0.0–0.1)
EOS ABS: 0 10*3/uL (ref 0.0–0.7)
Eosinophils Relative: 0.5 % (ref 0.0–5.0)
HEMATOCRIT: 45 % (ref 36.0–46.0)
HEMOGLOBIN: 15.5 g/dL — AB (ref 12.0–15.0)
LYMPHS ABS: 1.7 10*3/uL (ref 0.7–4.0)
LYMPHS PCT: 21.8 % (ref 12.0–46.0)
MCHC: 34.5 g/dL (ref 30.0–36.0)
MCV: 93.3 fl (ref 78.0–100.0)
Monocytes Absolute: 0.6 10*3/uL (ref 0.1–1.0)
Monocytes Relative: 8.3 % (ref 3.0–12.0)
NEUTROS ABS: 5.3 10*3/uL (ref 1.4–7.7)
Neutrophils Relative %: 69 % (ref 43.0–77.0)
Platelets: 205 10*3/uL (ref 150.0–400.0)
RBC: 4.82 Mil/uL (ref 3.87–5.11)
RDW: 14.5 % (ref 11.5–15.5)
WBC: 7.7 10*3/uL (ref 4.0–10.5)

## 2015-01-23 LAB — TSH: TSH: 0.99 u[IU]/mL (ref 0.35–4.50)

## 2015-01-23 LAB — HEPATIC FUNCTION PANEL
ALT: 22 U/L (ref 0–35)
AST: 23 U/L (ref 0–37)
Albumin: 4 g/dL (ref 3.5–5.2)
Alkaline Phosphatase: 66 U/L (ref 39–117)
BILIRUBIN DIRECT: 0.1 mg/dL (ref 0.0–0.3)
BILIRUBIN TOTAL: 0.8 mg/dL (ref 0.2–1.2)
TOTAL PROTEIN: 7.1 g/dL (ref 6.0–8.3)

## 2015-01-23 LAB — BASIC METABOLIC PANEL
BUN: 16 mg/dL (ref 6–23)
CHLORIDE: 104 meq/L (ref 96–112)
CO2: 30 meq/L (ref 19–32)
Calcium: 9.7 mg/dL (ref 8.4–10.5)
Creatinine, Ser: 0.91 mg/dL (ref 0.40–1.20)
GFR: 67.02 mL/min (ref >60.00)
GLUCOSE: 98 mg/dL (ref 70–99)
POTASSIUM: 4.7 meq/L (ref 3.5–5.1)
SODIUM: 141 meq/L (ref 135–145)

## 2015-01-23 LAB — RHEUMATOID FACTOR: Rhuematoid fact SerPl-aCnc: 25 IU/mL — ABNORMAL HIGH (ref <=14)

## 2015-01-23 LAB — SEDIMENTATION RATE: Sed Rate: 9 mm/hr (ref 0–22)

## 2015-01-23 LAB — T4, FREE: FREE T4: 0.84 ng/dL (ref 0.60–1.60)

## 2015-01-23 MED ORDER — MONTELUKAST SODIUM 10 MG PO TABS: 10.0000 mg | ORAL_TABLET | Freq: Every day | ORAL | Status: DC

## 2015-01-23 MED ORDER — CITALOPRAM HYDROBROMIDE 20 MG PO TABS: 20.0000 mg | ORAL_TABLET | Freq: Every day | ORAL | Status: DC

## 2015-01-23 NOTE — Patient Instructions (Signed)
Plain Mucinex (NOT D) for thick secretions ;force NON dairy fluids .   Nasal cleansing in the shower as discussed with lather of mild shampoo.After 10 seconds wash off lather while  exhaling through nostrils. Make sure that all residual soap is removed to prevent irritation.  Flonase OR Nasacort AQ 1 spray in each nostril twice a day as needed. Use the "crossover" technique into opposite nostril spraying toward opposite ear @ 45 degree angle, not straight up into nostril. Fill the  prescription for Singulair it there is not dramatic improvement in the next 48 hours.   Your next office appointment will be determined based upon review of your pending labs  and  xrays Those instructions will be transmitted to you by mail for your records. Critical results will be called. Followup as needed for any active or acute issue. Please report any significant change in your symptoms.  Please reconsider taking the agent to raise the neurotransmitters which are essential for good brain function, both intellectual & emotional health. These agents are not addictive and simply keep this essential neurotransmitter at therapeutic levels. If these levels become severely depleted; depression or panic attacks can occur.

## 2015-01-23 NOTE — Progress Notes (Signed)
   Subjective:    Patient ID: Stacey [name], female    DOB: Oct 30, 1955, 59 y.o.   MRN: [id number]  HPI She is here today because she's worried she has cancer of bones and lymph nodes .She sees Dr. Rhona Raider with [facility]. She states I feel "it's bad luck to walk into that oncology building". She states that she "lives in fear". She brings in labs done by Dr. Rhona Raider 08/09/14. These included a CBC and differential and comprehensive metabolic profile. She was concerned that the granulocyte percentage was 80.3, lymphocytes 13.5, and basophil 0.0%. She states Dr. Rhona Raider had assured her this was of no concern but she still has fear about changes indicating cancer in the bones.  She's having pain in the left shoulder with range of motion activities such as putting on her seatbelt or scrubbing her back. She has pain in her fingers intermittently as well as some pain in the left knee. Left knee also buckles  She has easy bruising simply  as hose cleaning.  She has had a cough for 4 months in the context of extrinsic symptoms with postnasal drainage and itchy/watery eyes. She has occasional shortness breath or wheezing. She's been 1 pack a day smoker ;but she states she's using an electronic cigarette now. She's used Claritin and Zyrtec for the extrinsic symptoms but these caused metal status changes. She denies associated upper respiratory tract infection symptoms   Review of Systems Frontal headache, facial pain , nasal purulence, dental pain, sore throat , otic pain or otic discharge denied. No fever , chills or sweats.    Objective:   Physical Exam  Pertinent or positive findings include:  Slight proptosis & lid lag.She has bilateral pterygia medially. There is an upper dental plate.  She has erythema of the oropharynx.  An S4 is noted.  Breath sounds are decreased.  She has minimal isolated DJD changes in the hands. There is minimal flexion contracture the fifth  fingers bilaterally. She's markedly anxious and intermittently tearful.  General appearance :adequately nourished; in no distress. Eyes: No conjunctival inflammation or scleral icterus is present. Oral exam:  Lips and gums are healthy appearing.There is no oropharyngeal erythema or exudate noted. Heart:  Normal rate and regular rhythm. S1 and S2 normal without gallop, murmur, click, or rub . Lungs:Chest clear to auscultation; no wheezes, rhonchi,rales ,or rubs present.No increased work of breathing.  Abdomen: bowel sounds normal, soft and non-tender without masses, organomegaly or hernias noted.  No guarding or rebound.  Vascular : all pulses equal ; no bruits present. Skin:Warm & dry.  Intact without suspicious lesions or rashes ; no tenting or jaundice  Lymphatic: No lymphadenopathy is noted about the head, neck, axilla Neuro: Strength, tone & DTRs normal.        Assessment & Plan:  #1 cancerphobia in the context of having had breast cancer  #2 allergic rhinitis   #3 cough in the context of #2 and continued nicotine use  #4 Minor degenerative joint disease  See orders. The pathophysiology of neurotransmitter deficiency was discussed along with the benefits and potential adverse effects of SSRI therapy.

## 2015-01-23 NOTE — Progress Notes (Signed)
Pre visit review using our clinic review tool, if applicable. No additional management support is needed unless otherwise documented below in the visit note. 

## 2015-01-24 ENCOUNTER — Telehealth: Payer: Self-pay

## 2015-01-24 LAB — CYCLIC CITRUL PEPTIDE ANTIBODY, IGG: Cyclic Citrullin Peptide Ab: <2 U/mL (ref 0.0–5.0)

## 2015-01-24 NOTE — Telephone Encounter (Signed)
Done

## 2015-01-24 NOTE — Telephone Encounter (Signed)
-----   Message from Hendricks Limes, MD sent at 01/24/2015  8:21 AM EDT ----- Please FAX office visit ,Xray & lab results to Dr Rhona Raider , [facility]. Thanks

## 2015-01-29 ENCOUNTER — Telehealth: Payer: Self-pay

## 2015-01-29 NOTE — Telephone Encounter (Signed)
Phone call from patient requesting xray results. She has been advised and another copy mailed since she states she did not get the first copy

## 2015-02-05 ENCOUNTER — Encounter: Payer: Self-pay | Admitting: Internal Medicine

## 2015-11-07 ENCOUNTER — Encounter: Payer: Self-pay | Admitting: Gastroenterology

## 2015-11-13 ENCOUNTER — Ambulatory Visit (INDEPENDENT_AMBULATORY_CARE_PROVIDER_SITE_OTHER): Payer: BLUE CROSS/BLUE SHIELD | Admitting: Family Medicine

## 2015-11-13 ENCOUNTER — Encounter: Payer: Self-pay | Admitting: Family Medicine

## 2015-11-13 VITALS — BP 123/81 | HR 79 | Temp 98.1°F | Wt 185.0 lb

## 2015-11-13 DIAGNOSIS — F17200 Nicotine dependence, unspecified, uncomplicated: Secondary | ICD-10-CM

## 2015-11-13 DIAGNOSIS — H109 Unspecified conjunctivitis: Secondary | ICD-10-CM | POA: Diagnosis not present

## 2015-11-13 DIAGNOSIS — J209 Acute bronchitis, unspecified: Secondary | ICD-10-CM

## 2015-11-13 MED ORDER — AZITHROMYCIN 250 MG PO TABS: ORAL_TABLET | ORAL | Status: DC

## 2015-11-13 MED ORDER — PROMETHAZINE-DM 6.25-15 MG/5ML PO SYRP: 5.0000 mL | ORAL_SOLUTION | Freq: Four times a day (QID) | ORAL | Status: DC | PRN

## 2015-11-13 MED ORDER — MOXIFLOXACIN HCL 0.5 % OP SOLN: 1.0000 [drp] | Freq: Three times a day (TID) | OPHTHALMIC | Status: DC

## 2015-11-13 NOTE — Progress Notes (Signed)
Pre visit review using our clinic review tool, if applicable. No additional management support is needed unless otherwise documented below in the visit note. 

## 2015-11-13 NOTE — Patient Instructions (Signed)

## 2015-11-13 NOTE — Progress Notes (Signed)
  Subjective:     Stacey [name] is a 60 y.o. female who presents for evaluation of symptoms of a URI. Symptoms include congestion, facial pain, nasal congestion, non productive cough, post nasal drip, sinus pressure, sore throat and d/c R eye and crusted shut in am. Onset of symptoms was 2 months ago, and has been rapidly worsening since that time. Treatment to date: nasal steroids.  The following portions of the patient's history were reviewed and updated as appropriate:  She  has a past medical history of Breast cancer ([place]) (2008). She  does not have any pertinent problems on file. She  has past surgical history that includes Breast lumpectomy (2008). Her family history is not on file. She  reports that she has been smoking Cigarettes.  She has a 67.5 pack-year smoking history. She does not have any smokeless tobacco history on file. She reports that she drinks alcohol. She reports that she does not use illicit drugs. She has a current medication list which includes the following prescription(s): aspirin, calcium-magnesium-vitamin d, vitamin d-3, diazepam, montelukast, centrum silver, turmeric, ubiquinol, vitamin c, azithromycin, moxifloxacin, and promethazine-dextromethorphan. Current Outpatient Prescriptions on File Prior to Visit  Medication Sig Dispense Refill  . aspirin 81 MG tablet Take 81 mg by mouth daily.    . Calcium-Magnesium-Vitamin D (CALCIUM 1200+D3) 600-40-500 MG-MG-UNIT TB24 Take by mouth daily.    . Cholecalciferol (VITAMIN D-3) 1000 UNITS CAPS Take by mouth daily.    . diazepam (VALIUM) 5 MG tablet Take 1 tablet (5 mg total) by mouth every 6 (six) hours as needed for anxiety. 30 tablet 2  . montelukast (SINGULAIR) 10 MG tablet Take 1 tablet (10 mg total) by mouth at bedtime. 30 tablet 3  . Multiple Vitamins-Minerals (CENTRUM SILVER) tablet Take 1 tablet by mouth daily.    . Turmeric 500 MG CAPS Take 500 mg by mouth daily.    Marland Kitchen Ubiquinol 100 MG CAPS Take by mouth daily.    .  vitamin C (ASCORBIC ACID) 500 MG tablet Take 500 mg by mouth daily.     No current facility-administered medications on file prior to visit.   She is allergic to amoxicillin and penicillins..  Review of Systems Pertinent items are noted in HPI.   Objective:    BP 123/81 mmHg  Pulse 79  Temp(Src) 98.1 F (36.7 C) (Oral)  Wt 185 lb (83.915 kg)  SpO2 95% General appearance: alert, cooperative, appears stated age and no distress Head: Normocephalic, without obvious abnormality, atraumatic Eyes: positive findings: conjunctiva: 2+ injection, + d/c Ears: normal TM's and external ear canals both ears Nose: green discharge, moderate congestion, turbinates red, swollen, sinus tenderness bilateral Throat: abnormal findings: mild oropharyngeal erythema Neck: mild anterior cervical adenopathy, supple, symmetrical, trachea midline and thyroid not enlarged, symmetric, no tenderness/mass/nodules Lungs: clear to auscultation bilaterally Heart: S1, S2 normal   Assessment:    sinusitis   Plan:    Discussed the diagnosis and treatment of sinusitis. Suggested symptomatic OTC remedies. Nasal saline spray for congestion. Zithromax per orders. Nasal steroids per orders. Follow up as needed. vigamox for eyes

## 2015-12-03 ENCOUNTER — Telehealth: Payer: Self-pay | Admitting: Acute Care

## 2015-12-03 DIAGNOSIS — F17210 Nicotine dependence, cigarettes, uncomplicated: Principal | ICD-10-CM

## 2015-12-03 NOTE — Telephone Encounter (Signed)
Received Lung Screening Referral from Dr. Etter Sjogren. Spoke with pt; discussed Lung Screening Program and Questionnaire.  Pt does meet criteria for program.   Pt states she will need to know whether or not this will be covered by her insurance before she is able to schedule apt.  She does believe she has a deductible that has not been met and co-insurance. Advised order will be placed for scan to be Precerted with insurance.  Once precert complete, we would contact her with update.  Pt verbalized understanding and is aware this process could take approx 7 days.  She voiced no further questions or concerns at this time and very grateful for our help.    Rodena Piety, please let me know once precert is complete so we can discuss with pt and schedule SDMV if pt would like to proceed.  Thank you!

## 2015-12-05 NOTE — Telephone Encounter (Signed)
I called the patients insurance company and found out that none of her deductible has been met. I called the patient and told her what I had found out and that for the CT Scan it would be 650.00 out of pocket. I advised her to call the insurance company and ask how much they would help pay for the CT and call us back if it was more reasonable then the 650.00. She stated she would call the insurance herself and call me back to let me know what she has decided

## 2015-12-17 NOTE — Telephone Encounter (Signed)
Called spoke with patient. She reports she is working with her insurance still and will call us to schedule once this is taken care of.

## 2016-01-15 ENCOUNTER — Encounter: Payer: Self-pay | Admitting: Internal Medicine

## 2016-01-15 ENCOUNTER — Ambulatory Visit (INDEPENDENT_AMBULATORY_CARE_PROVIDER_SITE_OTHER): Payer: BLUE CROSS/BLUE SHIELD | Admitting: Internal Medicine

## 2016-01-15 ENCOUNTER — Other Ambulatory Visit (INDEPENDENT_AMBULATORY_CARE_PROVIDER_SITE_OTHER): Payer: BLUE CROSS/BLUE SHIELD

## 2016-01-15 VITALS — BP 124/80 | HR 85 | Temp 98.1°F | Ht 68.0 in | Wt 184.0 lb

## 2016-01-15 DIAGNOSIS — Z1159 Encounter for screening for other viral diseases: Secondary | ICD-10-CM

## 2016-01-15 DIAGNOSIS — G43109 Migraine with aura, not intractable, without status migrainosus: Secondary | ICD-10-CM

## 2016-01-15 DIAGNOSIS — F17200 Nicotine dependence, unspecified, uncomplicated: Secondary | ICD-10-CM

## 2016-01-15 DIAGNOSIS — G43909 Migraine, unspecified, not intractable, without status migrainosus: Secondary | ICD-10-CM | POA: Insufficient documentation

## 2016-01-15 DIAGNOSIS — Z0000 Encounter for general adult medical examination without abnormal findings: Secondary | ICD-10-CM

## 2016-01-15 DIAGNOSIS — Z853 Personal history of malignant neoplasm of breast: Secondary | ICD-10-CM

## 2016-01-15 LAB — COMPREHENSIVE METABOLIC PANEL
ALBUMIN: 4 g/dL (ref 3.5–5.2)
ALT: 19 U/L (ref 0–35)
AST: 20 U/L (ref 0–37)
Alkaline Phosphatase: 59 U/L (ref 39–117)
BUN: 16 mg/dL (ref 6–23)
CHLORIDE: 105 meq/L (ref 96–112)
CO2: 30 meq/L (ref 19–32)
CREATININE: 0.88 mg/dL (ref 0.40–1.20)
Calcium: 9.3 mg/dL (ref 8.4–10.5)
GFR: 69.43 mL/min (ref >60.00)
Glucose, Bld: 111 mg/dL — ABNORMAL HIGH (ref 70–99)
Potassium: 4.8 mEq/L (ref 3.5–5.1)
SODIUM: 141 meq/L (ref 135–145)
Total Bilirubin: 0.7 mg/dL (ref 0.2–1.2)
Total Protein: 6.8 g/dL (ref 6.0–8.3)

## 2016-01-15 LAB — CBC
HCT: 45 % (ref 36.0–46.0)
HEMOGLOBIN: 15.1 g/dL — AB (ref 12.0–15.0)
MCHC: 33.5 g/dL (ref 30.0–36.0)
MCV: 94.1 fl (ref 78.0–100.0)
Platelets: 197 10*3/uL (ref 150.0–400.0)
RBC: 4.78 Mil/uL (ref 3.87–5.11)
RDW: 14.4 % (ref 11.5–15.5)
WBC: 6.2 10*3/uL (ref 4.0–10.5)

## 2016-01-15 LAB — TSH: TSH: 1.15 u[IU]/mL (ref 0.35–4.50)

## 2016-01-15 LAB — LIPID PANEL
Cholesterol: 200 mg/dL (ref 0–200)
HDL: 75.7 mg/dL (ref >39.00)
LDL Cholesterol: 113 mg/dL — ABNORMAL HIGH (ref 0–99)
NONHDL: 124.09
Total CHOL/HDL Ratio: 3
Triglycerides: 56 mg/dL (ref 0.0–149.0)
VLDL: 11.2 mg/dL (ref 0.0–40.0)

## 2016-01-15 LAB — HEPATITIS C ANTIBODY: HCV AB: NEGATIVE

## 2016-01-15 MED ORDER — DIAZEPAM 5 MG PO TABS: 5.0000 mg | ORAL_TABLET | Freq: Every day | ORAL | Status: DC | PRN

## 2016-01-15 NOTE — Progress Notes (Signed)
Pre visit review using our clinic review tool, if applicable. No additional management support is needed unless otherwise documented below in the visit note. 

## 2016-01-15 NOTE — Assessment & Plan Note (Signed)
She is still smoking 1.5 PPD and does not desire to quit. Does not want to get lung cancer screening at this time.

## 2016-01-15 NOTE — Assessment & Plan Note (Signed)
Declines need for medication. Offered MRI brain to her for this new pattern of migraines but she needs to think about it. She is not sure she can do the MRI.

## 2016-01-15 NOTE — Progress Notes (Signed)
   Subjective:    Patient ID: Stacey [name], female    DOB: 1956-05-31, 61 y.o.   MRN: [id number]  HPI The patient is a 60 YO female coming in for wellness. She is having several concerns and the biggest is recurrent migraines. She had them severely in her teens and they were never evaluated. She is getting them every other day now and they cause aura and vision changes and some numbness on her tongue. She takes 3 ibuprofen and this generally helps. They can be debilitating and she cannot drive or work during episodes. She is very concerned about cancer due to her history of breast cancer.   PMH, [facility], social history reviewed and updated.   Review of Systems  Constitutional: Positive for activity change. Negative for fever, appetite change, fatigue and unexpected weight change.       Not exercising at all  HENT: Negative for congestion, postnasal drip, rhinorrhea, sinus pressure and trouble swallowing.   Eyes: Negative.   Respiratory: Negative for cough, chest tightness, shortness of breath and wheezing.   Cardiovascular: Negative for chest pain, palpitations and leg swelling.  Gastrointestinal: Negative for nausea, abdominal pain, diarrhea, constipation and abdominal distention.  Musculoskeletal: Negative.   Skin: Negative.   Neurological: Positive for headaches.  Psychiatric/Behavioral: Positive for dysphoric mood and agitation. Negative for sleep disturbance and self-injury. The patient is not nervous/anxious.       Objective:   Physical Exam  Constitutional: She is oriented to person, place, and time. She appears well-developed and well-nourished.  HENT:  Head: Normocephalic and atraumatic.  Eyes: EOM are normal. Pupils are equal, round, and reactive to light.  Neck: Normal range of motion.  Cardiovascular: Normal rate and regular rhythm.   Pulmonary/Chest: Effort normal and breath sounds normal. No respiratory distress. She has no wheezes. She has no rales.  Abdominal: Soft. Bowel  sounds are normal. She exhibits no distension. There is no tenderness. There is no rebound.  Musculoskeletal: She exhibits no edema.  Neurological: She is alert and oriented to person, place, and time. Coordination normal.  Skin: Skin is warm and dry.  Psychiatric: She has a normal mood and affect.   Filed Vitals:   01/15/16 0819  BP: 124/80  Pulse: 85  Temp: 98.1 F (36.7 C)  TempSrc: Oral  Height: 5\' 8"  (1.727 m)  Weight: 184 lb (83.462 kg)  SpO2: 94%      Assessment & Plan:

## 2016-01-15 NOTE — Assessment & Plan Note (Signed)
Gets yearly 3D mammogram.

## 2016-01-15 NOTE — Patient Instructions (Signed)
We are checking the labs today and will send the results on my chart.   Think about whether you want to do the MRI of the brain. I think this is a reasonable option and if you are worried we can do the open and have you take part of a valium before the test.   Health Maintenance, Female Adopting a healthy lifestyle and getting preventive care can go a long way to promote health and wellness. Talk with your health care provider about what schedule of regular examinations is right for you. This is a good chance for you to check in with your provider about disease prevention and staying healthy. In between checkups, there are plenty of things you can do on your own. Experts have done a lot of research about which lifestyle changes and preventive measures are most likely to keep you healthy. Ask your health care provider for more information. WEIGHT AND DIET  Eat a healthy diet  Be sure to include plenty of vegetables, fruits, low-fat dairy products, and lean protein.  Do not eat a lot of foods high in solid fats, added sugars, or salt.  Get regular exercise. This is one of the most important things you can do for your health.  Most adults should exercise for at least 150 minutes each week. The exercise should increase your heart rate and make you sweat (moderate-intensity exercise).  Most adults should also do strengthening exercises at least twice a week. This is in addition to the moderate-intensity exercise.  Maintain a healthy weight  Body mass index (BMI) is a measurement that can be used to identify possible weight problems. It estimates body fat based on height and weight. Your health care provider can help determine your BMI and help you achieve or maintain a healthy weight.  For females 73 years of age and older:   A BMI below 18.5 is considered underweight.  A BMI of 18.5 to 24.9 is normal.  A BMI of 25 to 29.9 is considered overweight.  A BMI of 30 and above is considered  obese.  Watch levels of cholesterol and blood lipids  You should start having your blood tested for lipids and cholesterol at 61 years of age, then have this test every 5 years.  You may need to have your cholesterol levels checked more often if:  Your lipid or cholesterol levels are high.  You are older than 61 years of age.  You are at high risk for heart disease.  CANCER SCREENING   Lung Cancer  Lung cancer screening is recommended for adults 26-29 years old who are at high risk for lung cancer because of a history of smoking.  A yearly low-dose CT scan of the lungs is recommended for people who:  Currently smoke.  Have quit within the past 15 years.  Have at least a 30-pack-year history of smoking. A pack year is smoking an average of one pack of cigarettes a day for 1 year.  Yearly screening should continue until it has been 15 years since you quit.  Yearly screening should stop if you develop a health problem that would prevent you from having lung cancer treatment.  Breast Cancer  Practice breast self-awareness. This means understanding how your breasts normally appear and feel.  It also means doing regular breast self-exams. Let your health care provider know about any changes, no matter how small.  If you are in your 20s or 30s, you should have a clinical breast exam (CBE)  by a health care provider every 1-3 years as part of a regular health exam.  If you are 40 or older, have a CBE every year. Also consider having a breast X-ray (mammogram) every year.  If you have a family history of breast cancer, talk to your health care provider about genetic screening.  If you are at high risk for breast cancer, talk to your health care provider about having an MRI and a mammogram every year.  Breast cancer gene (BRCA) assessment is recommended for women who have family members with BRCA-related cancers. BRCA-related cancers  include:  Breast.  Ovarian.  Tubal.  Peritoneal cancers.  Results of the assessment will determine the need for genetic counseling and BRCA1 and BRCA2 testing. Cervical Cancer Your health care provider may recommend that you be screened regularly for cancer of the pelvic organs (ovaries, uterus, and vagina). This screening involves a pelvic examination, including checking for microscopic changes to the surface of your cervix (Pap test). You may be encouraged to have this screening done every 3 years, beginning at age 21.  For women ages 30-65, health care providers may recommend pelvic exams and Pap testing every 3 years, or they may recommend the Pap and pelvic exam, combined with testing for human papilloma virus (HPV), every 5 years. Some types of HPV increase your risk of cervical cancer. Testing for HPV may also be done on women of any age with unclear Pap test results.  Other health care providers may not recommend any screening for nonpregnant women who are considered low risk for pelvic cancer and who do not have symptoms. Ask your health care provider if a screening pelvic exam is right for you.  If you have had past treatment for cervical cancer or a condition that could lead to cancer, you need Pap tests and screening for cancer for at least 20 years after your treatment. If Pap tests have been discontinued, your risk factors (such as having a new sexual partner) need to be reassessed to determine if screening should resume. Some women have medical problems that increase the chance of getting cervical cancer. In these cases, your health care provider may recommend more frequent screening and Pap tests. Colorectal Cancer  This type of cancer can be detected and often prevented.  Routine colorectal cancer screening usually begins at 61 years of age and continues through 61 years of age.  Your health care provider may recommend screening at an earlier age if you have risk factors for  colon cancer.  Your health care provider may also recommend using home test kits to check for hidden blood in the stool.  A small camera at the end of a tube can be used to examine your colon directly (sigmoidoscopy or colonoscopy). This is done to check for the earliest forms of colorectal cancer.  Routine screening usually begins at age 50.  Direct examination of the colon should be repeated every 5-10 years through 61 years of age. However, you may need to be screened more often if early forms of precancerous polyps or small growths are found. Skin Cancer  Check your skin from head to toe regularly.  Tell your health care provider about any new moles or changes in moles, especially if there is a change in a mole's shape or color.  Also tell your health care provider if you have a mole that is larger than the size of a pencil eraser.  Always use sunscreen. Apply sunscreen liberally and repeatedly throughout the   day.  Protect yourself by wearing long sleeves, pants, a wide-brimmed hat, and sunglasses whenever you are outside. HEART DISEASE, DIABETES, AND HIGH BLOOD PRESSURE   High blood pressure causes heart disease and increases the risk of stroke. High blood pressure is more likely to develop in:  People who have blood pressure in the high end of the normal range (130-139/85-89 mm Hg).  People who are overweight or obese.  People who are African American.  If you are 18-39 years of age, have your blood pressure checked every 3-5 years. If you are 40 years of age or older, have your blood pressure checked every year. You should have your blood pressure measured twice--once when you are at a hospital or clinic, and once when you are not at a hospital or clinic. Record the average of the two measurements. To check your blood pressure when you are not at a hospital or clinic, you can use:  An automated blood pressure machine at a pharmacy.  A home blood pressure monitor.  If you  are between 55 years and 79 years old, ask your health care provider if you should take aspirin to prevent strokes.  Have regular diabetes screenings. This involves taking a blood sample to check your fasting blood sugar level.  If you are at a normal weight and have a low risk for diabetes, have this test once every three years after 61 years of age.  If you are overweight and have a high risk for diabetes, consider being tested at a younger age or more often. PREVENTING INFECTION  Hepatitis B  If you have a higher risk for hepatitis B, you should be screened for this virus. You are considered at high risk for hepatitis B if:  You were born in a country where hepatitis B is common. Ask your health care provider which countries are considered high risk.  Your parents were born in a high-risk country, and you have not been immunized against hepatitis B (hepatitis B vaccine).  You have HIV or AIDS.  You use needles to inject street drugs.  You live with someone who has hepatitis B.  You have had sex with someone who has hepatitis B.  You get hemodialysis treatment.  You take certain medicines for conditions, including cancer, organ transplantation, and autoimmune conditions. Hepatitis C  Blood testing is recommended for:  Everyone born from 1945 through 1965.  Anyone with known risk factors for hepatitis C. Sexually transmitted infections (STIs)  You should be screened for sexually transmitted infections (STIs) including gonorrhea and chlamydia if:  You are sexually active and are younger than 61 years of age.  You are older than 61 years of age and your health care provider tells you that you are at risk for this type of infection.  Your sexual activity has changed since you were last screened and you are at an increased risk for chlamydia or gonorrhea. Ask your health care provider if you are at risk.  If you do not have HIV, but are at risk, it may be recommended that you  take a prescription medicine daily to prevent HIV infection. This is called pre-exposure prophylaxis (PrEP). You are considered at risk if:  You are sexually active and do not regularly use condoms or know the HIV status of your partner(s).  You take drugs by injection.  You are sexually active with a partner who has HIV. Talk with your health care provider about whether you are at high risk of   being infected with HIV. If you choose to begin PrEP, you should first be tested for HIV. You should then be tested every 3 months for as long as you are taking PrEP.  PREGNANCY   If you are premenopausal and you may become pregnant, ask your health care provider about preconception counseling.  If you may become pregnant, take 400 to 800 micrograms (mcg) of folic acid every day.  If you want to prevent pregnancy, talk to your health care provider about birth control (contraception). OSTEOPOROSIS AND MENOPAUSE   Osteoporosis is a disease in which the bones lose minerals and strength with aging. This can result in serious bone fractures. Your risk for osteoporosis can be identified using a bone density scan.  If you are 65 years of age or older, or if you are at risk for osteoporosis and fractures, ask your health care provider if you should be screened.  Ask your health care provider whether you should take a calcium or vitamin D supplement to lower your risk for osteoporosis.  Menopause may have certain physical symptoms and risks.  Hormone replacement therapy may reduce some of these symptoms and risks. Talk to your health care provider about whether hormone replacement therapy is right for you.  HOME CARE INSTRUCTIONS   Schedule regular health, dental, and eye exams.  Stay current with your immunizations.   Do not use any tobacco products including cigarettes, chewing tobacco, or electronic cigarettes.  If you are pregnant, do not drink alcohol.  If you are breastfeeding, limit how  much and how often you drink alcohol.  Limit alcohol intake to no more than 1 drink per day for nonpregnant women. One drink equals 12 ounces of beer, 5 ounces of wine, or 1 ounces of hard liquor.  Do not use street drugs.  Do not share needles.  Ask your health care provider for help if you need support or information about quitting drugs.  Tell your health care provider if you often feel depressed.  Tell your health care provider if you have ever been abused or do not feel safe at home.   This information is not intended to replace advice given to you by your health care provider. Make sure you discuss any questions you have with your health care provider.   Document Released: 03/24/2011 Document Revised: 09/29/2014 Document Reviewed: 08/10/2013 [employer] Interactive Patient Education 2016 [employer]  

## 2016-08-04 ENCOUNTER — Telehealth: Payer: Self-pay | Admitting: *Deleted

## 2016-08-04 MED ORDER — DIAZEPAM 5 MG PO TABS: 5.0000 mg | ORAL_TABLET | Freq: Every day | ORAL | 0 refills | Status: DC | PRN

## 2016-08-04 NOTE — Telephone Encounter (Signed)
Faxed script bck to [facility]...Johny Chess

## 2016-08-04 NOTE — Telephone Encounter (Signed)
Rec'd fax pt requesting refill on Diazepam 5 mg. Last filled 02/15/16...Johny Chess

## 2016-08-25 ENCOUNTER — Ambulatory Visit (INDEPENDENT_AMBULATORY_CARE_PROVIDER_SITE_OTHER)
Admission: RE | Admit: 2016-08-25 | Discharge: 2016-08-25 | Disposition: A | Discharge status: Home / Self Care | Payer: Self-pay | Source: Ambulatory Visit | Attending: Internal Medicine | Admitting: Internal Medicine

## 2016-08-25 ENCOUNTER — Ambulatory Visit (INDEPENDENT_AMBULATORY_CARE_PROVIDER_SITE_OTHER): Payer: Self-pay | Admitting: Internal Medicine

## 2016-08-25 ENCOUNTER — Encounter: Payer: Self-pay | Admitting: Internal Medicine

## 2016-08-25 VITALS — BP 108/74 | HR 89 | Temp 98.0°F | Resp 18 | Ht 68.0 in | Wt 186.0 lb

## 2016-08-25 DIAGNOSIS — R059 Cough, unspecified: Secondary | ICD-10-CM

## 2016-08-25 DIAGNOSIS — R05 Cough: Secondary | ICD-10-CM

## 2016-08-25 MED ORDER — TRIAMCINOLONE ACETONIDE 55 MCG/ACT NA AERO: 2.0000 | INHALATION_SPRAY | Freq: Every day | NASAL | 12 refills | Status: DC

## 2016-08-25 MED ORDER — PROMETHAZINE VC/CODEINE 6.25-5-10 MG/5ML PO SYRP: 5.0000 mL | ORAL_SOLUTION | Freq: Every evening | ORAL | 0 refills | Status: DC | PRN

## 2016-08-25 MED ORDER — DOXYCYCLINE HYCLATE 100 MG PO TABS: 100.0000 mg | ORAL_TABLET | Freq: Two times a day (BID) | ORAL | 0 refills | Status: DC

## 2016-08-25 NOTE — Progress Notes (Signed)
   Subjective:    Patient ID: Stacey [name], female    DOB: [birth year], 60 y.o.   MRN: [id number]  HPI The patient is a 60 YO female coming in for cough and cold symptoms for 3 months. She does have hx breast cancer and is a current smoker. She is having daily cough which is limiting her activities. She can cough for up to 45 minutes at a time and sometimes is nauseated and almost throws up from coughing. She has taken some mucinex over the counter but no allergy medications. She has nasacort at home which she has not used. She denies SOB. She is coughing up some mucus and this has been clear to different colors. Finally she came in because this was not improving.   Review of Systems  Constitutional: Positive for activity change. Negative for appetite change, chills, fever and unexpected weight change.  HENT: Positive for congestion, postnasal drip and rhinorrhea. Negative for ear discharge, ear pain, sinus pain, sinus pressure, sore throat and trouble swallowing.   Eyes: Negative.   Respiratory: Positive for cough. Negative for chest tightness, shortness of breath and wheezing.   Cardiovascular: Negative.   Gastrointestinal: Negative.   Musculoskeletal: Negative.       Objective:   Physical Exam  Constitutional: She appears well-developed and well-nourished.  HENT:  Head: Normocephalic and atraumatic.  TMs normal, oropharynx with clear drainage, nose without crusting, no sinus pain.   Eyes: EOM are normal.  Neck: Normal range of motion.  Cardiovascular: Normal rate and regular rhythm.   Pulmonary/Chest: Effort normal. No respiratory distress. She has no wheezes.  Some scattered rhonchi  Abdominal: Soft.  Lymphadenopathy:    She has no cervical adenopathy.  Skin: Skin is warm and dry.   Vitals:   08/25/16 1552  BP: 108/74  Pulse: 89  Resp: 18  Temp: 98 F (36.7 C)  TempSrc: Oral  SpO2: 95%  Weight: 186 lb (84.4 kg)  Height: 5\' 8"  (1.727 m)      Assessment & Plan:

## 2016-08-25 NOTE — Patient Instructions (Signed)
We have sent in doxycycline for the cough. Take 1 pill twice a day for 10 days. We recommend with food for less chance of nausea.   We are getting a chest x-ray today and will call you back with the results.   Start using the nasacort again everyday for the next 3-4 weeks (we have sent in a refill if you need).   We have given you the cough syrup prescription today as well to use for cough.

## 2016-08-25 NOTE — Progress Notes (Signed)
Pre visit review using our clinic review tool, if applicable. No additional management support is needed unless otherwise documented below in the visit note. 

## 2016-08-26 DIAGNOSIS — R05 Cough: Secondary | ICD-10-CM | POA: Insufficient documentation

## 2016-08-26 DIAGNOSIS — R059 Cough, unspecified: Secondary | ICD-10-CM | POA: Insufficient documentation

## 2016-08-26 NOTE — Assessment & Plan Note (Signed)
Needs CXR due to hx breast cancer and smoker with ongoing cough for 3 months. Rx for doxycycline and cough syrup. Also refilled her nasacort as she had not been using and advised she should take zyrtec as well as some of the cough is likely coming from chronic drainage.

## 2016-09-11 ENCOUNTER — Encounter: Payer: Self-pay | Admitting: Internal Medicine

## 2016-09-17 ENCOUNTER — Telehealth: Payer: Self-pay | Admitting: Internal Medicine

## 2016-09-17 NOTE — Telephone Encounter (Signed)
Contacted patient, patient stated she finished doxycycline, she is using nasacort, and cough syrup as prescribed.  Nasocort is helping but is only being used at night, patient is stuffy during the day and wants to know if she could use nasocort during the day.  Still some nasal congestion, pt coughs to loosen congestion, other wise is feeling better. Please advise if there are any recommendation.  Pt rq rf abx.

## 2016-09-17 NOTE — Telephone Encounter (Signed)
Pt husband called in said that pt has completed her round of meds and she is still not any better.  He is concerned because she has had it for 4 months now  Best number  [phone]

## 2016-09-18 NOTE — Telephone Encounter (Signed)
She can change the nasacort to daytime. Would recommend visit since she was seen almost a month ago. She can add zyrtec for better allergy control.

## 2016-09-19 NOTE — Telephone Encounter (Signed)
Contacted patients husband and scheduled apt for Wednesday at 8:30 for [facility]

## 2016-09-24 ENCOUNTER — Encounter: Payer: Self-pay | Admitting: Internal Medicine

## 2016-09-24 ENCOUNTER — Ambulatory Visit (INDEPENDENT_AMBULATORY_CARE_PROVIDER_SITE_OTHER): Payer: 59 | Admitting: Internal Medicine

## 2016-09-24 DIAGNOSIS — R05 Cough: Secondary | ICD-10-CM | POA: Diagnosis not present

## 2016-09-24 DIAGNOSIS — R059 Cough, unspecified: Secondary | ICD-10-CM

## 2016-09-24 NOTE — Assessment & Plan Note (Signed)
Likely residual from smoking. Infection is resolved. She does not need any antibiotics today. She is advised to stop smoking.

## 2016-09-24 NOTE — Patient Instructions (Signed)
We do not need to do anything different. You are getting better.   Think about stopping smoking to help protect the lungs.

## 2016-09-24 NOTE — Progress Notes (Signed)
Pre visit review using our clinic review tool, if applicable. No additional management support is needed unless otherwise documented below in the visit note. 

## 2016-09-24 NOTE — Progress Notes (Signed)
   Subjective:    Patient ID: Stacey [name], female    DOB: 06-09-1956, 61 y.o.   MRN: [id number]  HPI The patient is a 61 YO female coming in for persistent cough and nasal congestion for the last month. She is taking nasacort daily and used some cough syrup. She did round of antibiotics back in early December and felt that they helped a lot. She is mostly feeling better compared to the worst of the symptoms. No SOB. No fevers or chills. Mild cough which is mostly non-productive.   Review of Systems  Constitutional: Negative.   Respiratory: Positive for cough. Negative for chest tightness, shortness of breath and wheezing.   Cardiovascular: Negative.   Gastrointestinal: Negative.   Musculoskeletal: Negative.   Neurological: Negative.       Objective:   Physical Exam  Constitutional: She is oriented to person, place, and time. She appears well-developed and well-nourished.  HENT:  Head: Normocephalic and atraumatic.  Eyes: EOM are normal.  Cardiovascular: Normal rate and regular rhythm.   Pulmonary/Chest: Effort normal and breath sounds normal. No respiratory distress. She has no wheezes. She has no rales.  Abdominal: Soft.  Neurological: She is alert and oriented to person, place, and time.  Skin: Skin is warm and dry.   Vitals:   09/24/16 0830  BP: 140/78  Pulse: 94  Resp: 12  Temp: 98.4 F (36.9 C)  TempSrc: Oral  SpO2: 99%  Weight: 191 lb (86.6 kg)  Height: 5\' 8"  (1.727 m)      Assessment & Plan:

## 2016-11-21 DIAGNOSIS — Z1231 Encounter for screening mammogram for malignant neoplasm of breast: Secondary | ICD-10-CM | POA: Diagnosis not present

## 2016-11-21 LAB — HM MAMMOGRAPHY

## 2017-07-06 ENCOUNTER — Encounter: Payer: 59 | Admitting: Internal Medicine

## 2017-07-13 DIAGNOSIS — Z01419 Encounter for gynecological examination (general) (routine) without abnormal findings: Secondary | ICD-10-CM | POA: Diagnosis not present

## 2017-07-13 DIAGNOSIS — Z124 Encounter for screening for malignant neoplasm of cervix: Secondary | ICD-10-CM | POA: Diagnosis not present

## 2017-07-13 LAB — HM PAP SMEAR

## 2017-07-15 LAB — HM PAP SMEAR: HM PAP: NEGATIVE

## 2017-07-16 ENCOUNTER — Encounter: Payer: Self-pay | Admitting: Internal Medicine

## 2017-07-16 ENCOUNTER — Other Ambulatory Visit (INDEPENDENT_AMBULATORY_CARE_PROVIDER_SITE_OTHER): Payer: 59

## 2017-07-16 ENCOUNTER — Ambulatory Visit (INDEPENDENT_AMBULATORY_CARE_PROVIDER_SITE_OTHER): Payer: 59 | Admitting: Internal Medicine

## 2017-07-16 VITALS — BP 126/82 | HR 77 | Temp 98.1°F | Ht 68.0 in | Wt 198.0 lb

## 2017-07-16 DIAGNOSIS — Z853 Personal history of malignant neoplasm of breast: Secondary | ICD-10-CM | POA: Diagnosis not present

## 2017-07-16 DIAGNOSIS — Z0000 Encounter for general adult medical examination without abnormal findings: Secondary | ICD-10-CM

## 2017-07-16 DIAGNOSIS — F17200 Nicotine dependence, unspecified, uncomplicated: Secondary | ICD-10-CM

## 2017-07-16 LAB — COMPREHENSIVE METABOLIC PANEL
ALT: 22 U/L (ref 0–35)
AST: 21 U/L (ref 0–37)
Albumin: 3.9 g/dL (ref 3.5–5.2)
Alkaline Phosphatase: 61 U/L (ref 39–117)
BILIRUBIN TOTAL: 0.7 mg/dL (ref 0.2–1.2)
BUN: 16 mg/dL (ref 6–23)
CALCIUM: 9.2 mg/dL (ref 8.4–10.5)
CO2: 31 meq/L (ref 19–32)
CREATININE: 0.91 mg/dL (ref 0.40–1.20)
Chloride: 106 mEq/L (ref 96–112)
GFR: 66.47 mL/min (ref >60.00)
Glucose, Bld: 101 mg/dL — ABNORMAL HIGH (ref 70–99)
Potassium: 5.4 mEq/L — ABNORMAL HIGH (ref 3.5–5.1)
Sodium: 142 mEq/L (ref 135–145)
TOTAL PROTEIN: 6.5 g/dL (ref 6.0–8.3)

## 2017-07-16 LAB — LIPID PANEL
CHOLESTEROL: 181 mg/dL (ref 0–200)
HDL: 77.3 mg/dL (ref >39.00)
LDL Cholesterol: 90 mg/dL (ref 0–99)
NonHDL: 103.22
TRIGLYCERIDES: 65 mg/dL (ref 0.0–149.0)
Total CHOL/HDL Ratio: 2
VLDL: 13 mg/dL (ref 0.0–40.0)

## 2017-07-16 LAB — CBC
HCT: 45.1 % (ref 36.0–46.0)
Hemoglobin: 15.1 g/dL — ABNORMAL HIGH (ref 12.0–15.0)
MCHC: 33.4 g/dL (ref 30.0–36.0)
MCV: 96.5 fl (ref 78.0–100.0)
Platelets: 183 10*3/uL (ref 150.0–400.0)
RBC: 4.68 Mil/uL (ref 3.87–5.11)
RDW: 14.3 % (ref 11.5–15.5)
WBC: 6.5 10*3/uL (ref 4.0–10.5)

## 2017-07-16 LAB — TSH: TSH: 1.73 u[IU]/mL (ref 0.35–4.50)

## 2017-07-16 LAB — VITAMIN D 25 HYDROXY (VIT D DEFICIENCY, FRACTURES): VITD: 22.5 ng/mL — ABNORMAL LOW (ref 30.00–100.00)

## 2017-07-16 LAB — VITAMIN B12: VITAMIN B 12: 189 pg/mL — AB (ref 211–911)

## 2017-07-16 LAB — HEMOGLOBIN A1C: HEMOGLOBIN A1C: 6.2 % (ref 4.6–6.5)

## 2017-07-16 LAB — T4, FREE: Free T4: 0.86 ng/dL (ref 0.60–1.60)

## 2017-07-16 NOTE — Patient Instructions (Signed)
We will check the blood work today and send you the results.   Health Maintenance, Female Adopting a healthy lifestyle and getting preventive care can go a long way to promote health and wellness. Talk with your health care provider about what schedule of regular examinations is right for you. This is a good chance for you to check in with your provider about disease prevention and staying healthy. In between checkups, there are plenty of things you can do on your own. Experts have done a lot of research about which lifestyle changes and preventive measures are most likely to keep you healthy. Ask your health care provider for more information. Weight and diet Eat a healthy diet  Be sure to include plenty of vegetables, fruits, low-fat dairy products, and lean protein.  Do not eat a lot of foods high in solid fats, added sugars, or salt.  Get regular exercise. This is one of the most important things you can do for your health. ? Most adults should exercise for at least 150 minutes each week. The exercise should increase your heart rate and make you sweat (moderate-intensity exercise). ? Most adults should also do strengthening exercises at least twice a week. This is in addition to the moderate-intensity exercise.  Maintain a healthy weight  Body mass index (BMI) is a measurement that can be used to identify possible weight problems. It estimates body fat based on height and weight. Your health care provider can help determine your BMI and help you achieve or maintain a healthy weight.  For females 22 years of age and older: ? A BMI below 18.5 is considered underweight. ? A BMI of 18.5 to 24.9 is normal. ? A BMI of 25 to 29.9 is considered overweight. ? A BMI of 30 and above is considered obese.  Watch levels of cholesterol and blood lipids  You should start having your blood tested for lipids and cholesterol at 62 years of age, then have this test every 5 years.  You may need to have  your cholesterol levels checked more often if: ? Your lipid or cholesterol levels are high. ? You are older than 62 years of age. ? You are at high risk for heart disease.  Cancer screening Lung Cancer  Lung cancer screening is recommended for adults 69-7 years old who are at high risk for lung cancer because of a history of smoking.  A yearly low-dose CT scan of the lungs is recommended for people who: ? Currently smoke. ? Have quit within the past 15 years. ? Have at least a 30-pack-year history of smoking. A pack year is smoking an average of one pack of cigarettes a day for 1 year.  Yearly screening should continue until it has been 15 years since you quit.  Yearly screening should stop if you develop a health problem that would prevent you from having lung cancer treatment.  Breast Cancer  Practice breast self-awareness. This means understanding how your breasts normally appear and feel.  It also means doing regular breast self-exams. Let your health care provider know about any changes, no matter how small.  If you are in your 20s or 30s, you should have a clinical breast exam (CBE) by a health care provider every 1-3 years as part of a regular health exam.  If you are 7 or older, have a CBE every year. Also consider having a breast X-ray (mammogram) every year.  If you have a family history of breast cancer, talk to  your health care provider about genetic screening.  If you are at high risk for breast cancer, talk to your health care provider about having an MRI and a mammogram every year.  Breast cancer gene (BRCA) assessment is recommended for women who have family members with BRCA-related cancers. BRCA-related cancers include: ? Breast. ? Ovarian. ? Tubal. ? Peritoneal cancers.  Results of the assessment will determine the need for genetic counseling and BRCA1 and BRCA2 testing.  Cervical Cancer Your health care provider may recommend that you be screened  regularly for cancer of the pelvic organs (ovaries, uterus, and vagina). This screening involves a pelvic examination, including checking for microscopic changes to the surface of your cervix (Pap test). You may be encouraged to have this screening done every 3 years, beginning at age 80.  For women ages 9-65, health care providers may recommend pelvic exams and Pap testing every 3 years, or they may recommend the Pap and pelvic exam, combined with testing for human papilloma virus (HPV), every 5 years. Some types of HPV increase your risk of cervical cancer. Testing for HPV may also be done on women of any age with unclear Pap test results.  Other health care providers may not recommend any screening for nonpregnant women who are considered low risk for pelvic cancer and who do not have symptoms. Ask your health care provider if a screening pelvic exam is right for you.  If you have had past treatment for cervical cancer or a condition that could lead to cancer, you need Pap tests and screening for cancer for at least 20 years after your treatment. If Pap tests have been discontinued, your risk factors (such as having a new sexual partner) need to be reassessed to determine if screening should resume. Some women have medical problems that increase the chance of getting cervical cancer. In these cases, your health care provider may recommend more frequent screening and Pap tests.  Colorectal Cancer  This type of cancer can be detected and often prevented.  Routine colorectal cancer screening usually begins at 62 years of age and continues through 62 years of age.  Your health care provider may recommend screening at an earlier age if you have risk factors for colon cancer.  Your health care provider may also recommend using home test kits to check for hidden blood in the stool.  A small camera at the end of a tube can be used to examine your colon directly (sigmoidoscopy or colonoscopy). This is  done to check for the earliest forms of colorectal cancer.  Routine screening usually begins at age 45.  Direct examination of the colon should be repeated every 5-10 years through 62 years of age. However, you may need to be screened more often if early forms of precancerous polyps or small growths are found.  Skin Cancer  Check your skin from head to toe regularly.  Tell your health care provider about any new moles or changes in moles, especially if there is a change in a mole's shape or color.  Also tell your health care provider if you have a mole that is larger than the size of a pencil eraser.  Always use sunscreen. Apply sunscreen liberally and repeatedly throughout the day.  Protect yourself by wearing long sleeves, pants, a wide-brimmed hat, and sunglasses whenever you are outside.  Heart disease, diabetes, and high blood pressure  High blood pressure causes heart disease and increases the risk of stroke. High blood pressure is more likely  to develop in: ? People who have blood pressure in the high end of the normal range (130-139/85-89 mm Hg). ? People who are overweight or obese. ? People who are African American.  If you are 41-24 years of age, have your blood pressure checked every 3-5 years. If you are 31 years of age or older, have your blood pressure checked every year. You should have your blood pressure measured twice-once when you are at a hospital or clinic, and once when you are not at a hospital or clinic. Record the average of the two measurements. To check your blood pressure when you are not at a hospital or clinic, you can use: ? An automated blood pressure machine at a pharmacy. ? A home blood pressure monitor.  If you are between 45 years and 72 years old, ask your health care provider if you should take aspirin to prevent strokes.  Have regular diabetes screenings. This involves taking a blood sample to check your fasting blood sugar level. ? If you are  at a normal weight and have a low risk for diabetes, have this test once every three years after 62 years of age. ? If you are overweight and have a high risk for diabetes, consider being tested at a younger age or more often. Preventing infection Hepatitis B  If you have a higher risk for hepatitis B, you should be screened for this virus. You are considered at high risk for hepatitis B if: ? You were born in a country where hepatitis B is common. Ask your health care provider which countries are considered high risk. ? Your parents were born in a high-risk country, and you have not been immunized against hepatitis B (hepatitis B vaccine). ? You have HIV or AIDS. ? You use needles to inject street drugs. ? You live with someone who has hepatitis B. ? You have had sex with someone who has hepatitis B. ? You get hemodialysis treatment. ? You take certain medicines for conditions, including cancer, organ transplantation, and autoimmune conditions.  Hepatitis C  Blood testing is recommended for: ? Everyone born from [birth year] through 1966. ? Anyone with known risk factors for hepatitis C.  Sexually transmitted infections (STIs)  You should be screened for sexually transmitted infections (STIs) including gonorrhea and chlamydia if: ? You are sexually active and are younger than 62 years of age. ? You are older than 62 years of age and your health care provider tells you that you are at risk for this type of infection. ? Your sexual activity has changed since you were last screened and you are at an increased risk for chlamydia or gonorrhea. Ask your health care provider if you are at risk.  If you do not have HIV, but are at risk, it may be recommended that you take a prescription medicine daily to prevent HIV infection. This is called pre-exposure prophylaxis (PrEP). You are considered at risk if: ? You are sexually active and do not regularly use condoms or know the HIV status of your  partner(s). ? You take drugs by injection. ? You are sexually active with a partner who has HIV.  Talk with your health care provider about whether you are at high risk of being infected with HIV. If you choose to begin PrEP, you should first be tested for HIV. You should then be tested every 3 months for as long as you are taking PrEP. Pregnancy  If you are premenopausal and you may become  pregnant, ask your health care provider about preconception counseling.  If you may become pregnant, take 400 to 800 micrograms (mcg) of folic acid every day.  If you want to prevent pregnancy, talk to your health care provider about birth control (contraception). Osteoporosis and menopause  Osteoporosis is a disease in which the bones lose minerals and strength with aging. This can result in serious bone fractures. Your risk for osteoporosis can be identified using a bone density scan.  If you are 72 years of age or older, or if you are at risk for osteoporosis and fractures, ask your health care provider if you should be screened.  Ask your health care provider whether you should take a calcium or vitamin D supplement to lower your risk for osteoporosis.  Menopause may have certain physical symptoms and risks.  Hormone replacement therapy may reduce some of these symptoms and risks. Talk to your health care provider about whether hormone replacement therapy is right for you. Follow these instructions at home:  Schedule regular health, dental, and eye exams.  Stay current with your immunizations.  Do not use any tobacco products including cigarettes, chewing tobacco, or electronic cigarettes.  If you are pregnant, do not drink alcohol.  If you are breastfeeding, limit how much and how often you drink alcohol.  Limit alcohol intake to no more than 1 drink per day for nonpregnant women. One drink equals 12 ounces of beer, 5 ounces of wine, or 1 ounces of hard liquor.  Do not use street  drugs.  Do not share needles.  Ask your health care provider for help if you need support or information about quitting drugs.  Tell your health care provider if you often feel depressed.  Tell your health care provider if you have ever been abused or do not feel safe at home. This information is not intended to replace advice given to you by your health care provider. Make sure you discuss any questions you have with your health care provider. Document Released: 03/24/2011 Document Revised: 02/14/2016 Document Reviewed: 06/12/2015 Elsevier Interactive Patient Education  [employer].

## 2017-07-16 NOTE — Progress Notes (Signed)
   Subjective:    Patient ID: Laural [name], female    DOB: Sep 30, 1955, 61 y.o.   MRN: [id number]  HPI The patient is a 61 YO female coming in for physical. Still smoking, diet is poor and not exercising.   PMH, [facility], social history reviewed and updated.   Review of Systems  Constitutional: Negative.   HENT: Negative.   Eyes: Negative.   Respiratory: Negative for cough, chest tightness and shortness of breath.   Cardiovascular: Negative for chest pain, palpitations and leg swelling.  Gastrointestinal: Negative for abdominal distention, abdominal pain, constipation, diarrhea, nausea and vomiting.  Musculoskeletal: Negative.   Skin: Negative.   Neurological: Negative.   Psychiatric/Behavioral: Negative.      Objective:   Physical Exam  Constitutional: She is oriented to person, place, and time. She appears well-developed and well-nourished.  HENT:  Head: Normocephalic and atraumatic.  Eyes: EOM are normal.  Neck: Normal range of motion.  Cardiovascular: Normal rate and regular rhythm.   Pulmonary/Chest: Effort normal and breath sounds normal. No respiratory distress. She has no wheezes. She has no rales.  Abdominal: Soft. Bowel sounds are normal. She exhibits no distension. There is no tenderness. There is no rebound.  Musculoskeletal: She exhibits no edema.  Neurological: She is alert and oriented to person, place, and time. Coordination normal.  Skin: Skin is warm and dry.  Psychiatric: She has a normal mood and affect.   Vitals:   07/16/17 0800  BP: 126/82  Pulse: 77  Temp: 98.1 F (36.7 C)  TempSrc: Oral  SpO2: 99%  Weight: 198 lb (89.8 kg)  Height: 5\' 8"  (1.727 m)      Assessment & Plan:

## 2017-07-16 NOTE — Assessment & Plan Note (Signed)
Declines flu, tetanus, shingrix. Counseled about sun safety and mole surveillance. Counseled about lack of exercise and smoking as causes of health risk for the future. Counseled about the dangers of distracted driving. Given screening recommendations.

## 2017-07-16 NOTE — Assessment & Plan Note (Signed)
Smoking 1.5 PPD and she is thinking about quitting but not able to make an attempt to quit now. She is aware of the risks and harms from cigarette smoke.

## 2017-07-16 NOTE — Assessment & Plan Note (Signed)
Now 10 years out from cancer with mammogram this year normal.

## 2017-07-17 LAB — HIV ANTIBODY (ROUTINE TESTING W REFLEX): HIV: NONREACTIVE

## 2017-07-18 ENCOUNTER — Encounter: Payer: Self-pay | Admitting: Internal Medicine

## 2017-07-21 ENCOUNTER — Encounter: Payer: Self-pay | Admitting: Internal Medicine

## 2017-10-12 DIAGNOSIS — M25532 Pain in left wrist: Secondary | ICD-10-CM | POA: Diagnosis not present

## 2017-10-12 DIAGNOSIS — Z720 Tobacco use: Secondary | ICD-10-CM | POA: Diagnosis not present

## 2017-10-13 ENCOUNTER — Ambulatory Visit: Payer: 59 | Admitting: Internal Medicine

## 2017-10-21 ENCOUNTER — Ambulatory Visit: Payer: Self-pay | Admitting: Internal Medicine

## 2017-10-26 ENCOUNTER — Ambulatory Visit: Payer: 59 | Admitting: Internal Medicine

## 2017-10-27 ENCOUNTER — Other Ambulatory Visit (INDEPENDENT_AMBULATORY_CARE_PROVIDER_SITE_OTHER): Payer: 59

## 2017-10-27 ENCOUNTER — Ambulatory Visit: Payer: 59 | Admitting: Internal Medicine

## 2017-10-27 ENCOUNTER — Encounter: Payer: Self-pay | Admitting: Internal Medicine

## 2017-10-27 VITALS — BP 130/76 | HR 92 | Temp 98.0°F | Ht 68.0 in | Wt 199.0 lb

## 2017-10-27 DIAGNOSIS — E538 Deficiency of other specified B group vitamins: Secondary | ICD-10-CM | POA: Diagnosis not present

## 2017-10-27 DIAGNOSIS — E559 Vitamin D deficiency, unspecified: Secondary | ICD-10-CM | POA: Diagnosis not present

## 2017-10-27 DIAGNOSIS — K148 Other diseases of tongue: Secondary | ICD-10-CM

## 2017-10-27 LAB — VITAMIN B12: VITAMIN B 12: 332 pg/mL (ref 211–911)

## 2017-10-27 LAB — VITAMIN D 25 HYDROXY (VIT D DEFICIENCY, FRACTURES): VITD: 31.04 ng/mL (ref 30.00–100.00)

## 2017-10-27 MED ORDER — CYANOCOBALAMIN 1000 MCG/ML IJ SOLN
1000.0000 ug | Freq: Once | INTRAMUSCULAR | Status: AC
  Administered 2017-10-27: 1000 ug via INTRAMUSCULAR

## 2017-10-27 MED ORDER — NYSTATIN 100000 UNIT/ML MT SUSP: 5.0000 mL | Freq: Three times a day (TID) | OROMUCOSAL | 0 refills | Status: DC

## 2017-10-27 NOTE — Patient Instructions (Signed)
We have sent in a medicine to swish and spit to help the white spot.   We will keep our eye on it and if it is still there in 1-2 months we can send you to an ear nose and throat specialist to see if they need to do a biopsy of it.

## 2017-10-27 NOTE — Progress Notes (Signed)
   Subjective:    Patient ID: Stacey [name], female    DOB: [birth year], 62 y.o.   MRN: [id number]  HPI The patient is a 62 YO female coming in for recheck b12 level. She has started taking oral B12 over the counter about 1-2 months ago. Does not feel different. Is also taking vitamin D over the counter. She is not able to take consistently and is concerned that perhaps she should be on injections as she would better be able to handle once per month. She is concerned that this is causing her to be unable to lose weight.  She also has a new concern which is white patch on her tongue. Started after she burned her tongue on food about 1-2 months ago. She denies loss of taste in the area. She has not noticed change in the area. No pain anymore. She is a smoker and is still smoking.   Review of Systems  Constitutional: Negative.   HENT:       Tongue lesion  Respiratory: Negative for cough, chest tightness and shortness of breath.   Cardiovascular: Negative for chest pain, palpitations and leg swelling.  Gastrointestinal: Negative for abdominal distention, abdominal pain, constipation, diarrhea, nausea and vomiting.  Musculoskeletal: Negative.   Skin: Negative.   Neurological: Negative.   Psychiatric/Behavioral: Negative.       Objective:   Physical Exam  Constitutional: She is oriented to person, place, and time. She appears well-developed and well-nourished.  HENT:  Head: Normocephalic and atraumatic.  White plaque on her tongue midline posterior which is partially scrapable.   Eyes: EOM are normal.  Neck: Normal range of motion.  Cardiovascular: Normal rate and regular rhythm.  Pulmonary/Chest: Effort normal and breath sounds normal. No respiratory distress. She has no wheezes. She has no rales.  Abdominal: Soft. Bowel sounds are normal. She exhibits no distension. There is no tenderness. There is no rebound.  Musculoskeletal: She exhibits no edema.  Neurological: She is alert and  oriented to person, place, and time. Coordination normal.  Skin: Skin is warm and dry.   Vitals:   10/27/17 1344  BP: 130/76  Pulse: 92  Temp: 98 F (36.7 C)  TempSrc: Oral  SpO2: 98%  Weight: 199 lb (90.3 kg)  Height: 5\' 8"  (1.727 m)      Assessment & Plan:  B12 shot given

## 2017-10-28 ENCOUNTER — Encounter: Payer: Self-pay | Admitting: Internal Medicine

## 2017-10-28 DIAGNOSIS — E559 Vitamin D deficiency, unspecified: Secondary | ICD-10-CM | POA: Insufficient documentation

## 2017-10-28 DIAGNOSIS — E538 Deficiency of other specified B group vitamins: Secondary | ICD-10-CM | POA: Insufficient documentation

## 2017-10-28 DIAGNOSIS — K148 Other diseases of tongue: Secondary | ICD-10-CM | POA: Insufficient documentation

## 2017-10-28 NOTE — Assessment & Plan Note (Signed)
Does not appear cancerous. Will rx nystatin swish and spit for treatment. If no improvement may need ENT referral for biopsy.

## 2017-10-28 NOTE — Assessment & Plan Note (Signed)
Checking vitamin D level and adjust as needed. Compliance has been poor with daily supplementation.

## 2017-10-28 NOTE — Assessment & Plan Note (Signed)
Checking labs for B12, then she returned upstairs to get B12 shot to help with consistency in supplementation to see if she notices benefit.

## 2017-11-23 ENCOUNTER — Ambulatory Visit (INDEPENDENT_AMBULATORY_CARE_PROVIDER_SITE_OTHER): Payer: 59

## 2017-11-23 DIAGNOSIS — E538 Deficiency of other specified B group vitamins: Secondary | ICD-10-CM | POA: Diagnosis not present

## 2017-11-23 MED ORDER — CYANOCOBALAMIN 1000 MCG/ML IJ SOLN
1000.0000 ug | Freq: Once | INTRAMUSCULAR | Status: AC
  Administered 2017-11-23: 1000 ug via INTRAMUSCULAR

## 2017-11-23 NOTE — Progress Notes (Signed)
Medical treatment/procedure(s) were performed by non-physician practitioner and as supervising physician I was immediately available for consultation/collaboration. I agree with above. Elizabeth A Crawford, MD  

## 2017-11-24 ENCOUNTER — Ambulatory Visit: Payer: 59

## 2017-12-08 DIAGNOSIS — Z1231 Encounter for screening mammogram for malignant neoplasm of breast: Secondary | ICD-10-CM | POA: Diagnosis not present

## 2017-12-08 LAB — HM MAMMOGRAPHY

## 2017-12-10 ENCOUNTER — Encounter: Payer: Self-pay | Admitting: Internal Medicine

## 2017-12-14 ENCOUNTER — Telehealth: Payer: Self-pay | Admitting: Internal Medicine

## 2017-12-14 NOTE — Telephone Encounter (Signed)
Copied from [place] [id number]. Topic: General - Other >> Dec 14, 2017 12:14 PM Yvette Rack wrote: Reason for CRM: patient calling to get mammogram results that was done on 12-08-17

## 2017-12-14 NOTE — Telephone Encounter (Signed)
It was normal

## 2017-12-15 NOTE — Telephone Encounter (Signed)
Patient informed. 

## 2017-12-24 ENCOUNTER — Ambulatory Visit (INDEPENDENT_AMBULATORY_CARE_PROVIDER_SITE_OTHER): Payer: 59

## 2017-12-24 DIAGNOSIS — E538 Deficiency of other specified B group vitamins: Secondary | ICD-10-CM

## 2017-12-24 MED ORDER — CYANOCOBALAMIN 1000 MCG/ML IJ SOLN
1000.0000 ug | Freq: Once | INTRAMUSCULAR | Status: AC
  Administered 2017-12-24: 1000 ug via INTRAMUSCULAR

## 2017-12-24 NOTE — Progress Notes (Signed)
Medical screening examination/treatment/procedure(s) were performed by non-physician practitioner and as supervising physician I was immediately available for consultation/collaboration. I agree with above. James John, MD   

## 2018-01-25 ENCOUNTER — Ambulatory Visit (INDEPENDENT_AMBULATORY_CARE_PROVIDER_SITE_OTHER): Payer: 59

## 2018-01-25 DIAGNOSIS — E538 Deficiency of other specified B group vitamins: Secondary | ICD-10-CM

## 2018-01-25 MED ORDER — CYANOCOBALAMIN 1000 MCG/ML IJ SOLN
1000.0000 ug | Freq: Once | INTRAMUSCULAR | Status: AC
  Administered 2018-01-25: 1000 ug via INTRAMUSCULAR

## 2018-01-25 NOTE — Progress Notes (Signed)
I have reviewed and agree.

## 2018-02-25 ENCOUNTER — Other Ambulatory Visit: Payer: Self-pay | Admitting: Physician Assistant

## 2018-02-25 DIAGNOSIS — D229 Melanocytic nevi, unspecified: Secondary | ICD-10-CM | POA: Diagnosis not present

## 2018-02-25 DIAGNOSIS — L728 Other follicular cysts of the skin and subcutaneous tissue: Secondary | ICD-10-CM | POA: Diagnosis not present

## 2018-02-25 DIAGNOSIS — D485 Neoplasm of uncertain behavior of skin: Secondary | ICD-10-CM | POA: Diagnosis not present

## 2018-02-26 ENCOUNTER — Ambulatory Visit (INDEPENDENT_AMBULATORY_CARE_PROVIDER_SITE_OTHER): Payer: 59

## 2018-02-26 DIAGNOSIS — E538 Deficiency of other specified B group vitamins: Secondary | ICD-10-CM

## 2018-02-26 MED ORDER — CYANOCOBALAMIN 1000 MCG/ML IJ SOLN
1000.0000 ug | Freq: Once | INTRAMUSCULAR | Status: AC
  Administered 2018-02-26: 1000 ug via INTRAMUSCULAR

## 2018-02-26 NOTE — Progress Notes (Signed)
b12 Injection given.   Stacy J Burns, MD  

## 2018-04-01 ENCOUNTER — Telehealth: Payer: Self-pay | Admitting: *Deleted

## 2018-04-01 ENCOUNTER — Ambulatory Visit (INDEPENDENT_AMBULATORY_CARE_PROVIDER_SITE_OTHER): Payer: 59 | Admitting: *Deleted

## 2018-04-01 DIAGNOSIS — E538 Deficiency of other specified B group vitamins: Secondary | ICD-10-CM | POA: Diagnosis not present

## 2018-04-01 MED ORDER — CYANOCOBALAMIN 1000 MCG/ML IJ SOLN
1000.0000 ug | Freq: Once | INTRAMUSCULAR | Status: AC
  Administered 2018-04-01: 1000 ug via INTRAMUSCULAR

## 2018-04-01 NOTE — Telephone Encounter (Signed)
Pt came in to get her monthly B12 injection. Wanting to know when she need to return back to see MD, and how long does she need to take injection. Pls advise.Marland KitchenJohny Chess

## 2018-04-01 NOTE — Progress Notes (Signed)
Medical treatment/procedure(s) were performed by non-physician practitioner and as supervising physician I was immediately available for consultation/collaboration. I agree with above. Koby Pickup A Dashton Czerwinski, MD  

## 2018-04-01 NOTE — Telephone Encounter (Signed)
Notified pt w/MD response. Pt has concerns and question regarding injections. Wanting to see if she can see provider before getting next month shot. Made appt for 8/12 w/MD../lmb

## 2018-04-01 NOTE — Telephone Encounter (Signed)
She can continue monthly B12 shots or oral B12 daily per her preference. Recommendation is lifetime.

## 2018-05-03 ENCOUNTER — Ambulatory Visit: Payer: 59

## 2018-05-03 ENCOUNTER — Encounter: Payer: Self-pay | Admitting: Internal Medicine

## 2018-05-03 ENCOUNTER — Ambulatory Visit: Payer: 59 | Admitting: Internal Medicine

## 2018-05-03 DIAGNOSIS — F17200 Nicotine dependence, unspecified, uncomplicated: Secondary | ICD-10-CM

## 2018-05-03 DIAGNOSIS — E538 Deficiency of other specified B group vitamins: Secondary | ICD-10-CM | POA: Diagnosis not present

## 2018-05-03 NOTE — Assessment & Plan Note (Signed)
We calculated her heart risk of about 5.2% for next 10 years. She was advised that if she could stop smoking the risk could go down to 2.7%.

## 2018-05-03 NOTE — Progress Notes (Signed)
   Subjective:    Patient ID: Stacey [name], female    DOB: 03/05/1956, 62 y.o.   MRN: [id number]  HPI The patient is a 62 YO female coming in for follow up of B12 deficiency. She has been taking the B12 shots monthly for the last 6 months or so. She is not feeling much improvement. She is concerned about the cost of monthly injections. She is not sure she could do injections at home. She denies numbness or tingling. She has new heart history of several brothers with heart problems. She denies chest pains or SOB. She is not sure she wants evaluation but wants to talk about her risk.   Review of Systems  Constitutional: Negative.   HENT: Negative.   Eyes: Negative.   Respiratory: Negative for cough, chest tightness and shortness of breath.   Cardiovascular: Negative for chest pain, palpitations and leg swelling.  Gastrointestinal: Negative for abdominal distention, abdominal pain, constipation, diarrhea, nausea and vomiting.  Musculoskeletal: Negative.   Skin: Negative.   Neurological: Negative.   Psychiatric/Behavioral: Negative.       Objective:   Physical Exam  Constitutional: She is oriented to person, place, and time. She appears well-developed and well-nourished.  HENT:  Head: Normocephalic and atraumatic.  Eyes: EOM are normal.  Neck: Normal range of motion.  Cardiovascular: Normal rate and regular rhythm.  Pulmonary/Chest: Effort normal and breath sounds normal. No respiratory distress. She has no wheezes. She has no rales.  Abdominal: Soft. Bowel sounds are normal. She exhibits no distension. There is no tenderness. There is no rebound.  Musculoskeletal: She exhibits no edema.  Neurological: She is alert and oriented to person, place, and time. Coordination normal.  Skin: Skin is warm and dry.   Vitals:   05/03/18 0756  BP: 110/84  Pulse: 94  Temp: 98.9 F (37.2 C)  TempSrc: Oral  SpO2: 94%  Weight: 199 lb (90.3 kg)  Height: 5\' 8"  (1.727 m)      Assessment &  Plan:

## 2018-05-03 NOTE — Patient Instructions (Signed)
The over the counter B12 you need 100 mg daily for the oral pills.

## 2018-05-03 NOTE — Assessment & Plan Note (Signed)
She has been taking vitamin B12 shots for 6 months and would like to switch to oral replacement. We will switch to oral replacement and check levels in about 2-3 months.

## 2018-07-19 ENCOUNTER — Encounter: Payer: 59 | Admitting: Internal Medicine

## 2018-07-21 ENCOUNTER — Other Ambulatory Visit (INDEPENDENT_AMBULATORY_CARE_PROVIDER_SITE_OTHER): Payer: 59

## 2018-07-21 ENCOUNTER — Encounter: Payer: Self-pay | Admitting: Internal Medicine

## 2018-07-21 ENCOUNTER — Ambulatory Visit (INDEPENDENT_AMBULATORY_CARE_PROVIDER_SITE_OTHER): Payer: 59 | Admitting: Internal Medicine

## 2018-07-21 VITALS — BP 122/86 | HR 80 | Temp 98.2°F | Ht 68.0 in | Wt 201.0 lb

## 2018-07-21 DIAGNOSIS — Z0000 Encounter for general adult medical examination without abnormal findings: Secondary | ICD-10-CM

## 2018-07-21 DIAGNOSIS — Z23 Encounter for immunization: Secondary | ICD-10-CM

## 2018-07-21 DIAGNOSIS — E538 Deficiency of other specified B group vitamins: Secondary | ICD-10-CM | POA: Diagnosis not present

## 2018-07-21 DIAGNOSIS — Z853 Personal history of malignant neoplasm of breast: Secondary | ICD-10-CM

## 2018-07-21 DIAGNOSIS — F17200 Nicotine dependence, unspecified, uncomplicated: Secondary | ICD-10-CM

## 2018-07-21 LAB — COMPREHENSIVE METABOLIC PANEL
ALK PHOS: 59 U/L (ref 39–117)
ALT: 24 U/L (ref 0–35)
AST: 22 U/L (ref 0–37)
Albumin: 3.9 g/dL (ref 3.5–5.2)
BUN: 14 mg/dL (ref 6–23)
CO2: 31 mEq/L (ref 19–32)
Calcium: 9 mg/dL (ref 8.4–10.5)
Chloride: 104 mEq/L (ref 96–112)
Creatinine, Ser: 0.87 mg/dL (ref 0.40–1.20)
GFR: 69.78 mL/min (ref >60.00)
GLUCOSE: 96 mg/dL (ref 70–99)
POTASSIUM: 4.1 meq/L (ref 3.5–5.1)
Sodium: 141 mEq/L (ref 135–145)
TOTAL PROTEIN: 6.8 g/dL (ref 6.0–8.3)
Total Bilirubin: 0.8 mg/dL (ref 0.2–1.2)

## 2018-07-21 LAB — LIPID PANEL
CHOLESTEROL: 184 mg/dL (ref 0–200)
HDL: 71.4 mg/dL (ref >39.00)
LDL Cholesterol: 95 mg/dL (ref 0–99)
NonHDL: 112.24
TRIGLYCERIDES: 88 mg/dL (ref 0.0–149.0)
Total CHOL/HDL Ratio: 3
VLDL: 17.6 mg/dL (ref 0.0–40.0)

## 2018-07-21 LAB — CBC
HEMATOCRIT: 44.2 % (ref 36.0–46.0)
HEMOGLOBIN: 14.7 g/dL (ref 12.0–15.0)
MCHC: 33.3 g/dL (ref 30.0–36.0)
MCV: 95.4 fl (ref 78.0–100.0)
Platelets: 198 10*3/uL (ref 150.0–400.0)
RBC: 4.63 Mil/uL (ref 3.87–5.11)
RDW: 14 % (ref 11.5–15.5)
WBC: 7.6 10*3/uL (ref 4.0–10.5)

## 2018-07-21 LAB — HEMOGLOBIN A1C: Hgb A1c MFr Bld: 6.3 % (ref 4.6–6.5)

## 2018-07-21 LAB — VITAMIN B12: Vitamin B-12: 467 pg/mL (ref 211–911)

## 2018-07-21 MED ORDER — DIAZEPAM 5 MG PO TABS: 5.0000 mg | ORAL_TABLET | Freq: Every day | ORAL | 0 refills | Status: DC | PRN

## 2018-07-21 NOTE — Assessment & Plan Note (Signed)
Flu shot given at visit. Shingrix counseled. Tetanus up to date. Colonoscopy up to date. Mammogram up to date, pap smear up to date. Counseled about sun safety and mole surveillance. Counseled about the dangers of distracted driving. Given 10 year screening recommendations.

## 2018-07-21 NOTE — Assessment & Plan Note (Signed)
Mammogram yearly without changes, no new lumps and sees gyn for yearly breast exam.

## 2018-07-21 NOTE — Progress Notes (Signed)
   Subjective:    Patient ID: Stacey [name], female    DOB: [birth year], 62 y.o.   MRN: [id number]  HPI The patient is a 62 YO female coming in for physical.   PMH, [place], social history reviewed and updated.   Review of Systems  Constitutional: Negative.   HENT: Negative.   Eyes: Negative.   Respiratory: Negative for cough, chest tightness and shortness of breath.   Cardiovascular: Negative for chest pain, palpitations and leg swelling.  Gastrointestinal: Negative for abdominal distention, abdominal pain, constipation, diarrhea, nausea and vomiting.  Musculoskeletal: Negative.   Skin: Negative.   Neurological: Negative.   Psychiatric/Behavioral: Negative.       Objective:   Physical Exam  Constitutional: She is oriented to person, place, and time. She appears well-developed and well-nourished.  HENT:  Head: Normocephalic and atraumatic.  Eyes: EOM are normal.  Neck: Normal range of motion.  Cardiovascular: Normal rate and regular rhythm.  Pulmonary/Chest: Effort normal and breath sounds normal. No respiratory distress. She has no wheezes. She has no rales.  Abdominal: Soft. Bowel sounds are normal. She exhibits no distension. There is no tenderness. There is no rebound.  Musculoskeletal: She exhibits no edema.  Neurological: She is alert and oriented to person, place, and time. Coordination normal.  Skin: Skin is warm and dry.  Psychiatric: She has a normal mood and affect.   Vitals:   07/21/18 1043  BP: 122/86  Pulse: 80  Temp: 98.2 F (36.8 C)  TempSrc: Oral  SpO2: 97%  Weight: 201 lb (91.2 kg)  Height: 5\' 8"  (1.727 m)   EKG: Rate 67, axis mild left, interval normal, sinus, no st or t wave changes some r wave prominence assoc with her smoking, no prior to compare    Assessment & Plan:  Flu shot given at visit

## 2018-07-21 NOTE — Assessment & Plan Note (Signed)
On oral replacement for 2 months now and checking B12 level today.

## 2018-07-21 NOTE — Patient Instructions (Addendum)
The EKG of the heart is normal.   Work on quitting smoking if you can as this is the best thing you can do for your health.   Health Maintenance, Female Adopting a healthy lifestyle and getting preventive care can go a long way to promote health and wellness. Talk with your health care provider about what schedule of regular examinations is right for you. This is a good chance for you to check in with your provider about disease prevention and staying healthy. In between checkups, there are plenty of things you can do on your own. Experts have done a lot of research about which lifestyle changes and preventive measures are most likely to keep you healthy. Ask your health care provider for more information. Weight and diet Eat a healthy diet  Be sure to include plenty of vegetables, fruits, low-fat dairy products, and lean protein.  Do not eat a lot of foods high in solid fats, added sugars, or salt.  Get regular exercise. This is one of the most important things you can do for your health. ? Most adults should exercise for at least 150 minutes each week. The exercise should increase your heart rate and make you sweat (moderate-intensity exercise). ? Most adults should also do strengthening exercises at least twice a week. This is in addition to the moderate-intensity exercise.  Maintain a healthy weight  Body mass index (BMI) is a measurement that can be used to identify possible weight problems. It estimates body fat based on height and weight. Your health care provider can help determine your BMI and help you achieve or maintain a healthy weight.  For females 70 years of age and older: ? A BMI below 18.5 is considered underweight. ? A BMI of 18.5 to 24.9 is normal. ? A BMI of 25 to 29.9 is considered overweight. ? A BMI of 30 and above is considered obese.  Watch levels of cholesterol and blood lipids  You should start having your blood tested for lipids and cholesterol at 63 years of  age, then have this test every 5 years.  You may need to have your cholesterol levels checked more often if: ? Your lipid or cholesterol levels are high. ? You are older than 63 years of age. ? You are at high risk for heart disease.  Cancer screening Lung Cancer  Lung cancer screening is recommended for adults 26-53 years old who are at high risk for lung cancer because of a history of smoking.  A yearly low-dose CT scan of the lungs is recommended for people who: ? Currently smoke. ? Have quit within the past 15 years. ? Have at least a 30-pack-year history of smoking. A pack year is smoking an average of one pack of cigarettes a day for 1 year.  Yearly screening should continue until it has been 15 years since you quit.  Yearly screening should stop if you develop a health problem that would prevent you from having lung cancer treatment.  Breast Cancer  Practice breast self-awareness. This means understanding how your breasts normally appear and feel.  It also means doing regular breast self-exams. Let your health care provider know about any changes, no matter how small.  If you are in your 20s or 30s, you should have a clinical breast exam (CBE) by a health care provider every 1-3 years as part of a regular health exam.  If you are 5 or older, have a CBE every year. Also consider having a breast  X-ray (mammogram) every year.  If you have a family history of breast cancer, talk to your health care provider about genetic screening.  If you are at high risk for breast cancer, talk to your health care provider about having an MRI and a mammogram every year.  Breast cancer gene (BRCA) assessment is recommended for women who have family members with BRCA-related cancers. BRCA-related cancers include: ? Breast. ? Ovarian. ? Tubal. ? Peritoneal cancers.  Results of the assessment will determine the need for genetic counseling and BRCA1 and BRCA2 testing.  Cervical  Cancer Your health care provider may recommend that you be screened regularly for cancer of the pelvic organs (ovaries, uterus, and vagina). This screening involves a pelvic examination, including checking for microscopic changes to the surface of your cervix (Pap test). You may be encouraged to have this screening done every 3 years, beginning at age 78.  For women ages 57-65, health care providers may recommend pelvic exams and Pap testing every 3 years, or they may recommend the Pap and pelvic exam, combined with testing for human papilloma virus (HPV), every 5 years. Some types of HPV increase your risk of cervical cancer. Testing for HPV may also be done on women of any age with unclear Pap test results.  Other health care providers may not recommend any screening for nonpregnant women who are considered low risk for pelvic cancer and who do not have symptoms. Ask your health care provider if a screening pelvic exam is right for you.  If you have had past treatment for cervical cancer or a condition that could lead to cancer, you need Pap tests and screening for cancer for at least 20 years after your treatment. If Pap tests have been discontinued, your risk factors (such as having a new sexual partner) need to be reassessed to determine if screening should resume. Some women have medical problems that increase the chance of getting cervical cancer. In these cases, your health care provider may recommend more frequent screening and Pap tests.  Colorectal Cancer  This type of cancer can be detected and often prevented.  Routine colorectal cancer screening usually begins at 63 years of age and continues through 63 years of age.  Your health care provider may recommend screening at an earlier age if you have risk factors for colon cancer.  Your health care provider may also recommend using home test kits to check for hidden blood in the stool.  A small camera at the end of a tube can be used to  examine your colon directly (sigmoidoscopy or colonoscopy). This is done to check for the earliest forms of colorectal cancer.  Routine screening usually begins at age 36.  Direct examination of the colon should be repeated every 5-10 years through 63 years of age. However, you may need to be screened more often if early forms of precancerous polyps or small growths are found.  Skin Cancer  Check your skin from head to toe regularly.  Tell your health care provider about any new moles or changes in moles, especially if there is a change in a mole's shape or color.  Also tell your health care provider if you have a mole that is larger than the size of a pencil eraser.  Always use sunscreen. Apply sunscreen liberally and repeatedly throughout the day.  Protect yourself by wearing long sleeves, pants, a wide-brimmed hat, and sunglasses whenever you are outside.  Heart disease, diabetes, and high blood pressure  High blood  pressure causes heart disease and increases the risk of stroke. High blood pressure is more likely to develop in: ? People who have blood pressure in the high end of the normal range (130-139/85-89 mm Hg). ? People who are overweight or obese. ? People who are African American.  If you are 80-49 years of age, have your blood pressure checked every 3-5 years. If you are 77 years of age or older, have your blood pressure checked every year. You should have your blood pressure measured twice-once when you are at a hospital or clinic, and once when you are not at a hospital or clinic. Record the average of the two measurements. To check your blood pressure when you are not at a hospital or clinic, you can use: ? An automated blood pressure machine at a pharmacy. ? A home blood pressure monitor.  If you are between 73 years and 76 years old, ask your health care provider if you should take aspirin to prevent strokes.  Have regular diabetes screenings. This involves taking a  blood sample to check your fasting blood sugar level. ? If you are at a normal weight and have a low risk for diabetes, have this test once every three years after 63 years of age. ? If you are overweight and have a high risk for diabetes, consider being tested at a younger age or more often. Preventing infection Hepatitis B  If you have a higher risk for hepatitis B, you should be screened for this virus. You are considered at high risk for hepatitis B if: ? You were born in a country where hepatitis B is common. Ask your health care provider which countries are considered high risk. ? Your parents were born in a high-risk country, and you have not been immunized against hepatitis B (hepatitis B vaccine). ? You have HIV or AIDS. ? You use needles to inject street drugs. ? You live with someone who has hepatitis B. ? You have had sex with someone who has hepatitis B. ? You get hemodialysis treatment. ? You take certain medicines for conditions, including cancer, organ transplantation, and autoimmune conditions.  Hepatitis C  Blood testing is recommended for: ? Everyone born from [birth year] through 1966. ? Anyone with known risk factors for hepatitis C.  Sexually transmitted infections (STIs)  You should be screened for sexually transmitted infections (STIs) including gonorrhea and chlamydia if: ? You are sexually active and are younger than 63 years of age. ? You are older than 63 years of age and your health care provider tells you that you are at risk for this type of infection. ? Your sexual activity has changed since you were last screened and you are at an increased risk for chlamydia or gonorrhea. Ask your health care provider if you are at risk.  If you do not have HIV, but are at risk, it may be recommended that you take a prescription medicine daily to prevent HIV infection. This is called pre-exposure prophylaxis (PrEP). You are considered at risk if: ? You are sexually active and  do not regularly use condoms or know the HIV status of your partner(s). ? You take drugs by injection. ? You are sexually active with a partner who has HIV.  Talk with your health care provider about whether you are at high risk of being infected with HIV. If you choose to begin PrEP, you should first be tested for HIV. You should then be tested every 3 months for as  long as you are taking PrEP. Pregnancy  If you are premenopausal and you may become pregnant, ask your health care provider about preconception counseling.  If you may become pregnant, take 400 to 800 micrograms (mcg) of folic acid every day.  If you want to prevent pregnancy, talk to your health care provider about birth control (contraception). Osteoporosis and menopause  Osteoporosis is a disease in which the bones lose minerals and strength with aging. This can result in serious bone fractures. Your risk for osteoporosis can be identified using a bone density scan.  If you are 59 years of age or older, or if you are at risk for osteoporosis and fractures, ask your health care provider if you should be screened.  Ask your health care provider whether you should take a calcium or vitamin D supplement to lower your risk for osteoporosis.  Menopause may have certain physical symptoms and risks.  Hormone replacement therapy may reduce some of these symptoms and risks. Talk to your health care provider about whether hormone replacement therapy is right for you. Follow these instructions at home:  Schedule regular health, dental, and eye exams.  Stay current with your immunizations.  Do not use any tobacco products including cigarettes, chewing tobacco, or electronic cigarettes.  If you are pregnant, do not drink alcohol.  If you are breastfeeding, limit how much and how often you drink alcohol.  Limit alcohol intake to no more than 1 drink per day for nonpregnant women. One drink equals 12 ounces of beer, 5 ounces of  wine, or 1 ounces of hard liquor.  Do not use street drugs.  Do not share needles.  Ask your health care provider for help if you need support or information about quitting drugs.  Tell your health care provider if you often feel depressed.  Tell your health care provider if you have ever been abused or do not feel safe at home. This information is not intended to replace advice given to you by your health care provider. Make sure you discuss any questions you have with your health care provider. Document Released: 03/24/2011 Document Revised: 02/14/2016 Document Reviewed: 06/12/2015 Elsevier Interactive Patient Education  [employer].

## 2018-07-21 NOTE — Assessment & Plan Note (Signed)
She is again reminded to try to quit smoking and she is interested but not sure she can make an attempt with the holidays so close.

## 2018-07-27 ENCOUNTER — Encounter: Payer: Self-pay | Admitting: Internal Medicine

## 2018-08-02 ENCOUNTER — Encounter: Payer: Self-pay | Admitting: Internal Medicine

## 2018-08-24 DIAGNOSIS — L900 Lichen sclerosus et atrophicus: Secondary | ICD-10-CM | POA: Diagnosis not present

## 2018-09-28 DIAGNOSIS — Z01419 Encounter for gynecological examination (general) (routine) without abnormal findings: Secondary | ICD-10-CM | POA: Diagnosis not present

## 2018-09-28 DIAGNOSIS — L900 Lichen sclerosus et atrophicus: Secondary | ICD-10-CM | POA: Diagnosis not present

## 2018-09-29 DIAGNOSIS — M7731 Calcaneal spur, right foot: Secondary | ICD-10-CM | POA: Diagnosis not present

## 2018-09-29 DIAGNOSIS — M722 Plantar fascial fibromatosis: Secondary | ICD-10-CM | POA: Diagnosis not present

## 2018-10-06 DIAGNOSIS — M71571 Other bursitis, not elsewhere classified, right ankle and foot: Secondary | ICD-10-CM | POA: Diagnosis not present

## 2018-10-06 DIAGNOSIS — M722 Plantar fascial fibromatosis: Secondary | ICD-10-CM | POA: Diagnosis not present

## 2019-02-03 DIAGNOSIS — Z1231 Encounter for screening mammogram for malignant neoplasm of breast: Secondary | ICD-10-CM | POA: Diagnosis not present

## 2019-02-03 DIAGNOSIS — Z1239 Encounter for other screening for malignant neoplasm of breast: Secondary | ICD-10-CM | POA: Diagnosis not present

## 2019-02-03 LAB — HM MAMMOGRAPHY

## 2019-02-08 ENCOUNTER — Encounter: Payer: Self-pay | Admitting: Internal Medicine

## 2019-02-08 NOTE — Progress Notes (Signed)
Abstracted and sent to scan  

## 2019-02-12 ENCOUNTER — Encounter: Payer: Self-pay | Admitting: Internal Medicine

## 2019-03-01 ENCOUNTER — Encounter: Payer: Self-pay | Admitting: Internal Medicine

## 2019-03-01 DIAGNOSIS — R194 Change in bowel habit: Secondary | ICD-10-CM

## 2019-03-14 ENCOUNTER — Encounter: Payer: Self-pay | Admitting: Nurse Practitioner

## 2019-03-14 ENCOUNTER — Ambulatory Visit: Payer: 59 | Admitting: Nurse Practitioner

## 2019-03-14 VITALS — BP 132/70 | HR 76 | Temp 97.1°F | Ht 68.0 in | Wt 203.0 lb

## 2019-03-14 DIAGNOSIS — L290 Pruritus ani: Secondary | ICD-10-CM | POA: Diagnosis not present

## 2019-03-14 DIAGNOSIS — R194 Change in bowel habit: Secondary | ICD-10-CM

## 2019-03-14 MED ORDER — PEG 3350-KCL-NA BICARB-NACL 420 G PO SOLR: 4000.0000 mL | Freq: Once | ORAL | 0 refills | Status: AC

## 2019-03-14 NOTE — Patient Instructions (Signed)
If you are age 64 or older, your body mass index should be between 23-30. Your Body mass index is 30.87 kg/m. If this is out of the aforementioned range listed, please consider follow up with your Primary Care Provider.  If you are age 21 or younger, your body mass index should be between 19-25. Your Body mass index is 30.87 kg/m. If this is out of the aformentioned range listed, please consider follow up with your Primary Care Provider.   You have been scheduled for a colonoscopy. Please follow written instructions given to you at your visit today.  Please pick up your prep supplies at the pharmacy within the next 1-3 days. If you use inhalers (even only as needed), please bring them with you on the day of your procedure. Your physician has requested that you go to www.startemmi.com and enter the access code given to you at your visit today. This web site gives a general overview about your procedure. However, you should still follow specific instructions given to you by our office regarding your preparation for the procedure.  We have sent the following medications to your pharmacy for you to pick up at your convenience: Golytely  Thank you for choosing me and [facility].   Tye Savoy, NP

## 2019-03-14 NOTE — Progress Notes (Signed)
Chief Complaint: Perianal burning/itching and bowel changes  IMPRESSION and PLAN:     37. 63 year old female with perianal burning/itching , bloating and bowel changes (ribbon like, flat stools) since Dec 2019.  She is fearful of colon cancer. Using topical temovate cream vaginally and perianally for lichen sclerosis diagnosed by GYN in December. Perianally there is superficial  fissuring and a small area of loss pigmentation on exam.  Suspect perianal burning /  itching are secondary to lichen sclerosis. Internal hemorrhoids can cause similar symptoms.  -Patient is anxious about possibility of having colon cancer, her cousin recently passed away with it.  She would like to proceed a year earlier with her scheduled colonoscopy.  Given the bowel changes I do not think this is unreasonable.  I suspect the burning and itching are most likely secondary to fissuring, possibly internal hemorrhoids -The risks and benefits of colonoscopy with possible polypectomy / biopsies were discussed and the patient agrees to proceed.  2. Anxiety, takes Valium as needed.   HPI:     Patient is a 63 year old female with a history of breast cancer known to Dr. Ardis Hughs.  She had a colonoscopy in 2011 with findings of several small sessile hyperplastic polyps.  Sometime in December she began having bowel changes consisting of ribbonlike, flat stools.  She has not seen any blood in her stool.  No abnormal weight loss.  She has no abdominal pain but has noticed some increased bloating and gas .She also developed perianal itching and burning around the same time. Saw GYN, diagnosed with lichen sclerosis involving of vaginal and perianal area.   She has been using Temovate cream which helps but has not resolved her symptoms. No other GI complaints.   Review of systems: All systems reviewed and negative except where noted in the HPI  Past Medical History:  Diagnosis Date  . Breast cancer ([place]) 2008   L side, s/p  lumpectomy, chemotherapy and radiation   Past Surgical History:  Procedure Laterality Date  . BREAST LUMPECTOMY  2008   Family History  Problem Relation Age of Onset  . Diabetes Other     Creatinine clearance cannot be calculated (Patient's most recent lab result is older than the maximum 21 days allowed.)  Current Outpatient Medications  Medication Sig Dispense Refill  . aspirin 81 MG tablet Take 81 mg by mouth daily.    . Calcium-Magnesium-Vitamin D (CALCIUM 1200+D3) 600-40-500 MG-MG-UNIT TB24 Take by mouth daily.    . Cholecalciferol (VITAMIN D-3) 1000 UNITS CAPS Take by mouth daily.    . diazepam (VALIUM) 5 MG tablet Take 1 tablet (5 mg total) by mouth daily as needed for anxiety. 30 tablet 0  . Multiple Vitamins-Minerals (CENTRUM SILVER) tablet Take 1 tablet by mouth daily.    . Turmeric 500 MG CAPS Take 500 mg by mouth daily.    Marland Kitchen Ubiquinol 100 MG CAPS Take by mouth daily.    . vitamin C (ASCORBIC ACID) 500 MG tablet Take 500 mg by mouth daily.     No current facility-administered medications for this visit.     Physical Exam:     BP 132/70   Pulse 76   Temp (!) 97.1 F (36.2 C)   Ht 5\' 8"  (1.727 m)   Wt 203 lb (92.1 kg)   BMI 30.87 kg/m   GENERAL:  Pleasant female in NAD PSYCH: : Cooperative, normal affect EENT:  conjunctiva pink, mucous membranes moist, neck supple without masses CARDIAC:  RRR,  nomurmur heard, no peripheral edema PULM: Normal respiratory effort, lungs CTA bilaterally, no wheezing ABDOMEN:  Nondistended, soft, nontender. No obvious masses, no hepatomegaly,  normal bowel sounds RECTAL: External hemorrhoids.  There is superficial perianal fissuring.  Small area of loss of pigmentation.  DRE unremarkable with exception of a pea-sized semi-hard lesion on the right lateral wall (possibly hemorrhoidal tissue) SKIN:  turgor, no lesions seen Musculoskeletal:  Normal muscle tone, normal strength NEURO: Alert and oriented x 3, no focal neurologic deficits    Tye Savoy , NP 03/14/2019, 8:36 AM

## 2019-04-04 ENCOUNTER — Ambulatory Visit (INDEPENDENT_AMBULATORY_CARE_PROVIDER_SITE_OTHER): Payer: 59 | Admitting: Internal Medicine

## 2019-04-04 ENCOUNTER — Other Ambulatory Visit: Payer: Self-pay

## 2019-04-04 ENCOUNTER — Encounter: Payer: Self-pay | Admitting: Internal Medicine

## 2019-04-04 VITALS — BP 116/80 | HR 91 | Temp 98.7°F | Ht 68.0 in | Wt 203.0 lb

## 2019-04-04 DIAGNOSIS — R221 Localized swelling, mass and lump, neck: Secondary | ICD-10-CM

## 2019-04-04 NOTE — Patient Instructions (Addendum)
[phone], call them to schedule the ultrasound of the neck.

## 2019-04-04 NOTE — Assessment & Plan Note (Signed)
Given smoker and prior breast cancer will check US neck. Likely fatty tissue. No symptoms.

## 2019-04-04 NOTE — Progress Notes (Signed)
   Subjective:   Patient ID: Stacey [name], female    DOB: August 28, 1956, 63 y.o.   MRN: [id number]  HPI The patient is a 63 YO female coming in for a lump on her neck. She noticed it yesterday. Feels like fluid in there. Overall it is stable in size since onset. Denies sinus pain or pressure or drainage. Is a smoker which worries her and cancer seems to run in her family. Past breast cancer which is in remission. Having no night sweats, weight change, fevers or chills. Has tried nothing.   Review of Systems  Constitutional: Negative.   HENT: Negative.   Eyes: Negative.   Respiratory: Negative for cough, chest tightness and shortness of breath.   Cardiovascular: Negative for chest pain, palpitations and leg swelling.  Gastrointestinal: Negative for abdominal distention, abdominal pain, constipation, diarrhea, nausea and vomiting.  Musculoskeletal: Negative.   Skin: Negative.        Lump left neck base  Neurological: Negative.   Psychiatric/Behavioral: Negative.     Objective:  Physical Exam Constitutional:      Appearance: She is well-developed.  HENT:     Head: Normocephalic and atraumatic.  Neck:     Musculoskeletal: Normal range of motion and neck supple. No neck rigidity or muscular tenderness.     Comments: Some fullness at the left neck base, likely fatty tissue, no cervical LAD, thyroid without enlargement Cardiovascular:     Rate and Rhythm: Normal rate and regular rhythm.  Pulmonary:     Effort: Pulmonary effort is normal. No respiratory distress.     Breath sounds: Normal breath sounds. No wheezing or rales.  Abdominal:     General: Bowel sounds are normal. There is no distension.     Palpations: Abdomen is soft.     Tenderness: There is no abdominal tenderness. There is no rebound.  Skin:    General: Skin is warm and dry.  Neurological:     Mental Status: She is alert and oriented to person, place, and time.     Coordination: Coordination normal.     Vitals:   04/04/19 1255  BP: 116/80  Pulse: 91  Temp: 98.7 F (37.1 C)  TempSrc: Oral  SpO2: 93%  Weight: 203 lb (92.1 kg)  Height: 5\' 8"  (1.727 m)    Assessment & Plan:

## 2019-04-06 ENCOUNTER — Ambulatory Visit
Admission: RE | Admit: 2019-04-06 | Discharge: 2019-04-06 | Disposition: A | Discharge status: Home / Self Care | Payer: 59 | Source: Ambulatory Visit | Attending: Internal Medicine | Admitting: Internal Medicine

## 2019-04-06 DIAGNOSIS — R221 Localized swelling, mass and lump, neck: Secondary | ICD-10-CM

## 2019-04-07 ENCOUNTER — Telehealth: Payer: Self-pay | Admitting: Gastroenterology

## 2019-04-07 NOTE — Telephone Encounter (Signed)

## 2019-04-08 ENCOUNTER — Ambulatory Visit (AMBULATORY_SURGERY_CENTER): Payer: 59 | Admitting: Gastroenterology

## 2019-04-08 ENCOUNTER — Encounter: Payer: Self-pay | Admitting: Gastroenterology

## 2019-04-08 ENCOUNTER — Other Ambulatory Visit: Payer: Self-pay

## 2019-04-08 VITALS — BP 134/62 | HR 82 | Temp 98.4°F | Resp 12 | Ht 68.0 in | Wt 203.0 lb

## 2019-04-08 DIAGNOSIS — R194 Change in bowel habit: Secondary | ICD-10-CM

## 2019-04-08 DIAGNOSIS — D124 Benign neoplasm of descending colon: Secondary | ICD-10-CM | POA: Diagnosis not present

## 2019-04-08 DIAGNOSIS — L290 Pruritus ani: Secondary | ICD-10-CM

## 2019-04-08 DIAGNOSIS — D122 Benign neoplasm of ascending colon: Secondary | ICD-10-CM

## 2019-04-08 MED ORDER — SODIUM CHLORIDE 0.9 % IV SOLN: 500.0000 mL | Freq: Once | INTRAVENOUS | Status: DC

## 2019-04-08 MED ORDER — SODIUM CHLORIDE 0.9 % IV SOLN: 500.0000 mL | INTRAVENOUS | Status: DC

## 2019-04-08 NOTE — Progress Notes (Signed)
PT taken to PACU. Monitors in place. VSS. Report given to RN. 

## 2019-04-08 NOTE — Progress Notes (Signed)
Called to room to assist during endoscopic procedure.  Patient ID and intended procedure confirmed with present staff. Received instructions for my participation in the procedure from the performing physician.  

## 2019-04-08 NOTE — Progress Notes (Signed)
Temp CW Vitals JB 

## 2019-04-08 NOTE — Op Note (Signed)
[facility] Patient Name: Stacey [name] Procedure Date: 04/08/2019 1:34 PM MRN: [id number] Endoscopist: Milus Banister , MD Age: 63 Referring MD:  Date of Birth: [birth year] Gender: Female Account #: [id number] Procedure:                Colonoscopy Indications:              change in bowel habits Medicines:                Monitored Anesthesia Care Procedure:                Pre-Anesthesia Assessment:                           - Prior to the procedure, a History and Physical                            was performed, and patient medications and                            allergies were reviewed. The patient's tolerance of                            previous anesthesia was also reviewed. The risks                            and benefits of the procedure and the sedation                            options and risks were discussed with the patient.                            All questions were answered, and informed consent                            was obtained. Prior Anticoagulants: The patient has                            taken no previous anticoagulant or antiplatelet                            agents. ASA Grade Assessment: II - A patient with                            mild systemic disease. After reviewing the risks                            and benefits, the patient was deemed in                            satisfactory condition to undergo the procedure.                           After obtaining informed consent, the colonoscope  was passed under direct vision. Throughout the                            procedure, the patient's blood pressure, pulse, and                            oxygen saturations were monitored continuously. The                            Colonoscope was introduced through the anus and                            advanced to the the cecum, identified by                            appendiceal orifice and ileocecal valve. The                             colonoscopy was performed without difficulty. The                            patient tolerated the procedure well. The quality                            of the bowel preparation was good. The ileocecal                            valve, appendiceal orifice, and rectum were                            photographed. Scope In: 1:44:03 PM Scope Out: 1:59:25 PM Scope Withdrawal Time: 0 hours 6 minutes 48 seconds  Total Procedure Duration: 0 hours 15 minutes 22 seconds  Findings:                 Two sessile polyps were found in the descending                            colon and ascending colon. The polyps were 2 to 4                            mm in size. These polyps were removed with a cold                            snare. Resection and retrieval were complete.                           The exam was otherwise without abnormality on                            direct and retroflexion views. Complications:            No immediate complications. Estimated blood loss:  None. Estimated Blood Loss:     Estimated blood loss: none. Impression:               - Two 2 to 4 mm polyps in the descending colon and                            in the ascending colon, removed with a cold snare.                            Resected and retrieved.                           - The examination was otherwise normal on direct                            and retroflexion views. Recommendation:           - Patient has a contact number available for                            emergencies. The signs and symptoms of potential                            delayed complications were discussed with the                            patient. Return to normal activities tomorrow.                            Written discharge instructions were provided to the                            patient.                           - Resume previous diet.                           - Continue  present medications.                           You will receive a letter within 2-3 weeks with the                            pathology results and my final recommendations.                           If the polyp(s) is proven to be 'pre-cancerous' on                            pathology, you will need repeat colonoscopy in 7                            years. If the polyp(s) is NOT 'precancerous' on  pathology then you should repeat colon cancer                            screening in 10 years with colonoscopy without need                            for colon cancer screening by any method prior to                            then (including stool testing). Milus Banister, MD 04/08/2019 2:02:01 PM This report has been signed electronically.

## 2019-04-08 NOTE — Patient Instructions (Signed)
Discharge instructions given. Handout on polyps. Resume previous medications. YOU HAD AN ENDOSCOPIC PROCEDURE TODAY AT THE [facility]:   Refer to the procedure report that was given to you for any specific questions about what was found during the examination.  If the procedure report does not answer your questions, please call your gastroenterologist to clarify.  If you requested that your care partner not be given the details of your procedure findings, then the procedure report has been included in a sealed envelope for you to review at your convenience later.  YOU SHOULD EXPECT: Some feelings of bloating in the abdomen. Passage of more gas than usual.  Walking can help get rid of the air that was put into your GI tract during the procedure and reduce the bloating. If you had a lower endoscopy (such as a colonoscopy or flexible sigmoidoscopy) you may notice spotting of blood in your stool or on the toilet paper. If you underwent a bowel prep for your procedure, you may not have a normal bowel movement for a few days.  Please Note:  You might notice some irritation and congestion in your nose or some drainage.  This is from the oxygen used during your procedure.  There is no need for concern and it should clear up in a day or so.  SYMPTOMS TO REPORT IMMEDIATELY:   Following lower endoscopy (colonoscopy or flexible sigmoidoscopy):  Excessive amounts of blood in the stool  Significant tenderness or worsening of abdominal pains  Swelling of the abdomen that is new, acute  Fever of 100F or higher   For urgent or emergent issues, a gastroenterologist can be reached at any hour by calling [phone].   DIET:  We do recommend a small meal at first, but then you may proceed to your regular diet.  Drink plenty of fluids but you should avoid alcoholic beverages for 24 hours.  ACTIVITY:  You should plan to take it easy for the rest of today and you should NOT DRIVE or use heavy  machinery until tomorrow (because of the sedation medicines used during the test).    FOLLOW UP: Our staff will call the number listed on your records 48-72 hours following your procedure to check on you and address any questions or concerns that you may have regarding the information given to you following your procedure. If we do not reach you, we will leave a message.  We will attempt to reach you two times.  During this call, we will ask if you have developed any symptoms of COVID 19. If you develop any symptoms (ie: fever, flu-like symptoms, shortness of breath, cough etc.) before then, please call [phone].  If you test positive for Covid 19 in the 2 weeks post procedure, please call and report this information to us.    If any biopsies were taken you will be contacted by phone or by letter within the next 1-3 weeks.  Please call us at [phone] if you have not heard about the biopsies in 3 weeks.    SIGNATURES/CONFIDENTIALITY: You and/or your care partner have signed paperwork which will be entered into your electronic medical record.  These signatures attest to the fact that that the information above on your After Visit Summary has been reviewed and is understood.  Full responsibility of the confidentiality of this discharge information lies with you and/or your care-partner. 

## 2019-04-12 ENCOUNTER — Telehealth: Payer: Self-pay

## 2019-04-12 NOTE — Telephone Encounter (Signed)
  Follow up Call-  Call back number 04/08/2019  Post procedure Call Back phone  # [phone]  Permission to leave phone message Yes  Some recent data might be hidden     Patient questions:  Do you have a fever, pain , or abdominal swelling? No. Pain Score  0 *  Have you tolerated food without any problems? Yes.    Have you been able to return to your normal activities? Yes.    Do you have any questions about your discharge instructions: Diet   No. Medications  No. Follow up visit  No.  Do you have questions or concerns about your Care? No.  Actions: * If pain score is 4 or above: No action needed, pain <4.  1. Have you developed a fever since your procedure? no  2.   Have you had an respiratory symptoms (SOB or cough) since your procedure? no  3.   Have you tested positive for COVID 19 since your procedure no  4.   Have you had any family members/close contacts diagnosed with the COVID 19 since your procedure?  no   If yes to any of these questions please route to Joylene John, RN and Alphonsa Gin, Therapist, sports.

## 2019-04-14 ENCOUNTER — Encounter: Payer: Self-pay | Admitting: Gastroenterology

## 2020-06-07 ENCOUNTER — Telehealth: Payer: Self-pay | Admitting: Internal Medicine

## 2020-06-07 DIAGNOSIS — Z0000 Encounter for general adult medical examination without abnormal findings: Secondary | ICD-10-CM

## 2020-06-07 DIAGNOSIS — E538 Deficiency of other specified B group vitamins: Secondary | ICD-10-CM

## 2020-06-07 DIAGNOSIS — R739 Hyperglycemia, unspecified: Secondary | ICD-10-CM

## 2020-06-07 DIAGNOSIS — E559 Vitamin D deficiency, unspecified: Secondary | ICD-10-CM

## 2020-06-07 NOTE — Telephone Encounter (Signed)
    Patient requesting order for annual labs prior to 10/01 physical with Dr Quay Burow

## 2020-06-07 NOTE — Telephone Encounter (Signed)
Blood work ordered for GV 

## 2020-06-08 NOTE — Telephone Encounter (Signed)
Notified pt w/MD response.../lmb 

## 2020-06-12 ENCOUNTER — Other Ambulatory Visit (INDEPENDENT_AMBULATORY_CARE_PROVIDER_SITE_OTHER): Payer: 59

## 2020-06-12 DIAGNOSIS — E559 Vitamin D deficiency, unspecified: Secondary | ICD-10-CM

## 2020-06-12 DIAGNOSIS — Z0000 Encounter for general adult medical examination without abnormal findings: Secondary | ICD-10-CM

## 2020-06-12 DIAGNOSIS — R739 Hyperglycemia, unspecified: Secondary | ICD-10-CM

## 2020-06-12 DIAGNOSIS — E538 Deficiency of other specified B group vitamins: Secondary | ICD-10-CM

## 2020-06-12 LAB — CBC WITH DIFFERENTIAL/PLATELET
Basophils Absolute: 0.1 10*3/uL (ref 0.0–0.1)
Basophils Relative: 0.7 % (ref 0.0–3.0)
Eosinophils Absolute: 0.1 10*3/uL (ref 0.0–0.7)
Eosinophils Relative: 1.3 % (ref 0.0–5.0)
HCT: 48.1 % — ABNORMAL HIGH (ref 36.0–46.0)
Hemoglobin: 16.1 g/dL — ABNORMAL HIGH (ref 12.0–15.0)
Lymphocytes Relative: 24.1 % (ref 12.0–46.0)
Lymphs Abs: 1.7 10*3/uL (ref 0.7–4.0)
MCHC: 33.4 g/dL (ref 30.0–36.0)
MCV: 97 fl (ref 78.0–100.0)
Monocytes Absolute: 0.7 10*3/uL (ref 0.1–1.0)
Monocytes Relative: 10 % (ref 3.0–12.0)
Neutro Abs: 4.5 10*3/uL (ref 1.4–7.7)
Neutrophils Relative %: 63.9 % (ref 43.0–77.0)
Platelets: 181 10*3/uL (ref 150.0–400.0)
RBC: 4.96 Mil/uL (ref 3.87–5.11)
RDW: 14.2 % (ref 11.5–15.5)
WBC: 7.1 10*3/uL (ref 4.0–10.5)

## 2020-06-12 LAB — LIPID PANEL
Cholesterol: 180 mg/dL (ref 0–200)
HDL: 72 mg/dL (ref >39.00)
LDL Cholesterol: 92 mg/dL (ref 0–99)
NonHDL: 107.96
Total CHOL/HDL Ratio: 2
Triglycerides: 79 mg/dL (ref 0.0–149.0)
VLDL: 15.8 mg/dL (ref 0.0–40.0)

## 2020-06-12 LAB — COMPREHENSIVE METABOLIC PANEL
ALT: 21 U/L (ref 0–35)
AST: 19 U/L (ref 0–37)
Albumin: 4 g/dL (ref 3.5–5.2)
Alkaline Phosphatase: 62 U/L (ref 39–117)
BUN: 16 mg/dL (ref 6–23)
CO2: 29 mEq/L (ref 19–32)
Calcium: 8.9 mg/dL (ref 8.4–10.5)
Chloride: 103 mEq/L (ref 96–112)
Creatinine, Ser: 0.9 mg/dL (ref 0.40–1.20)
GFR: 62.76 mL/min (ref >60.00)
Glucose, Bld: 105 mg/dL — ABNORMAL HIGH (ref 70–99)
Potassium: 4.3 mEq/L (ref 3.5–5.1)
Sodium: 140 mEq/L (ref 135–145)
Total Bilirubin: 0.8 mg/dL (ref 0.2–1.2)
Total Protein: 6.7 g/dL (ref 6.0–8.3)

## 2020-06-12 LAB — TSH: TSH: 2.1 u[IU]/mL (ref 0.35–4.50)

## 2020-06-12 LAB — VITAMIN B12: Vitamin B-12: 304 pg/mL (ref 211–911)

## 2020-06-12 LAB — VITAMIN D 25 HYDROXY (VIT D DEFICIENCY, FRACTURES): VITD: 21.69 ng/mL — ABNORMAL LOW (ref 30.00–100.00)

## 2020-06-12 LAB — HEMOGLOBIN A1C: Hgb A1c MFr Bld: 6.5 % (ref 4.6–6.5)

## 2020-06-12 NOTE — Addendum Note (Signed)
Addended by: Jacob Moores on: 06/12/2020 08:21 AM   Modules accepted: Orders

## 2020-06-13 ENCOUNTER — Encounter: Payer: Self-pay | Admitting: Internal Medicine

## 2020-06-13 DIAGNOSIS — R7303 Prediabetes: Secondary | ICD-10-CM | POA: Insufficient documentation

## 2020-06-21 NOTE — Progress Notes (Signed)
Subjective:    Patient ID: Stacey [name], female    DOB: Sep 10, 1956, 64 y.o.   MRN: [id number]  HPI She is here for a physical exam.   Reviewed blood work.  She has a lot of stress right now because of her husband's situation.  She uses the diazepam as needed. This works well.     Medications and allergies reviewed with patient and updated if appropriate.  Patient Active Problem List   Diagnosis Date Noted  . Prediabetes 06/13/2020  . Lump in neck 04/04/2019  . B12 deficiency 10/28/2017  . Vitamin D deficiency 10/28/2017  . Routine general medical examination at a health care facility 01/15/2016  . Migraines 01/15/2016  . History of left breast cancer 09/05/2010  . Tobacco abuse 09/05/2010    Current Outpatient Medications on File Prior to Visit  Medication Sig Dispense Refill  . aspirin 81 MG tablet Take 81 mg by mouth daily.    . Calcium-Magnesium-Vitamin D (CALCIUM 1200+D3) 600-40-500 MG-MG-UNIT TB24 Take by mouth daily.    . Cholecalciferol (VITAMIN D-3) 1000 UNITS CAPS Take by mouth daily.    . clobetasol cream (TEMOVATE) 0.05 % Apply to affected area bid    . diazepam (VALIUM) 5 MG tablet Take 1 tablet (5 mg total) by mouth daily as needed for anxiety. 30 tablet 0  . Multiple Vitamins-Minerals (CENTRUM SILVER) tablet Take 1 tablet by mouth daily.    Marland Kitchen Ubiquinol 100 MG CAPS Take by mouth daily.    . vitamin C (ASCORBIC ACID) 500 MG tablet Take 500 mg by mouth daily.     No current facility-administered medications on file prior to visit.    Past Medical History:  Diagnosis Date  . Breast cancer ([place]) 2008   L side, s/p lumpectomy, chemotherapy and radiation    Past Surgical History:  Procedure Laterality Date  . BREAST LUMPECTOMY  2008    Social History   Socioeconomic History  . Marital status: Married    Spouse name: Not on file  . Number of children: Not on file  . Years of education: Not on file  . Highest education level: Not on file    Occupational History  . Not on file  Tobacco Use  . Smoking status: Current Every Day Smoker    Packs/day: 1.50    Years: 45.00    Pack years: 67.50    Types: Cigarettes  . Smokeless tobacco: Never Used  Substance and Sexual Activity  . Alcohol use: Yes    Alcohol/week: 0.0 standard drinks  . Drug use: No  . Sexual activity: Not on file  Other Topics Concern  . Not on file  Social History Narrative  . Not on file   Social Determinants of Health   Financial Resource Strain:   . Difficulty of Paying Living Expenses: Not on file  Food Insecurity:   . Worried About Charity fundraiser in the Last Year: Not on file  . Ran Out of Food in the Last Year: Not on file  Transportation Needs:   . Lack of Transportation (Medical): Not on file  . Lack of Transportation (Non-Medical): Not on file  Physical Activity:   . Days of Exercise per Week: Not on file  . Minutes of Exercise per Session: Not on file  Stress:   . Feeling of Stress : Not on file  Social Connections:   . Frequency of Communication with Friends and Family: Not on file  . Frequency of Social Gatherings  with Friends and Family: Not on file  . Attends Religious Services: Not on file  . Active Member of Clubs or Organizations: Not on file  . Attends Archivist Meetings: Not on file  . Marital Status: Not on file    Family History  Problem Relation Age of Onset  . Diabetes Other   . Diabetes Mother   . Heart disease Father   . Breast cancer Sister   . Heart disease Brother   . Breast cancer Paternal Aunt   . Colon cancer Paternal Aunt   . Heart disease Maternal Grandfather   . Celiac disease Paternal Grandmother   . Heart disease Paternal Grandfather     Review of Systems  Constitutional: Negative for chills and fever.  Eyes: Negative for visual disturbance.  Respiratory: Negative for cough, shortness of breath and wheezing.   Cardiovascular: Positive for leg swelling (R ankle only - mild).  Negative for chest pain and palpitations.  Gastrointestinal: Negative for abdominal pain, blood in stool, constipation, diarrhea and nausea.       No gerd  Genitourinary: Negative for dysuria and hematuria.  Musculoskeletal: Positive for arthralgias (R shoulder). Negative for back pain.  Skin: Negative for rash.  Neurological: Negative for light-headedness and headaches.  Hematological: Bruises/bleeds easily (easy bruisig).  Psychiatric/Behavioral: The patient is nervous/anxious.        Objective:   Vitals:   06/22/20 1356  BP: 138/82  Pulse: 99  Temp: 98.4 F (36.9 C)  SpO2: 93%   Filed Weights   06/22/20 1356  Weight: 209 lb 6.4 oz (95 kg)   Body mass index is 31.84 kg/m.  BP Readings from Last 3 Encounters:  06/22/20 138/82  04/08/19 134/62  04/04/19 116/80    Wt Readings from Last 3 Encounters:  06/22/20 209 lb 6.4 oz (95 kg)  04/08/19 203 lb (92.1 kg)  04/04/19 203 lb (92.1 kg)     Physical Exam Constitutional: She appears well-developed and well-nourished. No distress.  HENT:  Head: Normocephalic and atraumatic.  Right Ear: External ear normal. Normal ear canal and TM Left Ear: External ear normal.  Normal ear canal and TM Mouth/Throat: Oropharynx is clear and moist.  Eyes: Conjunctivae and EOM are normal.  Neck: Neck supple. No tracheal deviation present. No thyromegaly present.  No carotid bruit  Cardiovascular: Normal rate, regular rhythm and normal heart sounds.   No murmur heard.  No edema. Pulmonary/Chest: Effort normal and breath sounds normal. No respiratory distress. She has no wheezes. She has no rales.  Breast: deferred   Abdominal: Soft. She exhibits no distension. There is no tenderness.  Lymphadenopathy: She has no cervical adenopathy.  Skin: Skin is warm and dry. She is not diaphoretic.  Psychiatric: She has a normal mood and affect. Her behavior is normal.        Assessment & Plan:   Physical exam: Screening blood work     reviewed Immunizations   Had covid, deferred flu vac, advised prevnar, tdap, shingrix Colonoscopy  Up to date    Mammogram  Up to date  Gyn  Up to date  Dexa  Will get via gyn Eye exams  Not up to date Exercise  None - stressed regular exerices Weight  Advised weight loss Substance abuse  smokes,   Alcohol - 4 beers/day  See Problem List for Assessment and Plan of chronic medical problems.   This visit occurred during the SARS-CoV-2 public health emergency.  Safety protocols were in place, including screening  questions prior to the visit, additional usage of staff PPE, and extensive cleaning of exam room while observing appropriate contact time as indicated for disinfecting solutions.

## 2020-06-21 NOTE — Patient Instructions (Addendum)
Blood work was reviewed.    Go downstairs for your shoulder xray.    All other Health Maintenance issues reviewed.   All recommended immunizations and age-appropriate screenings are up-to-date or discussed.  No immunization administered today.   Medications reviewed and updated.  Changes include :   Stop the aspirin   Follow up with  Dr Sharlet Salina in 6 months    Health Maintenance, Female Adopting a healthy lifestyle and getting preventive care are important in promoting health and wellness. Ask your health care provider about:  The right schedule for you to have regular tests and exams.  Things you can do on your own to prevent diseases and keep yourself healthy. What should I know about diet, weight, and exercise? Eat a healthy diet   Eat a diet that includes plenty of vegetables, fruits, low-fat dairy products, and lean protein.  Do not eat a lot of foods that are high in solid fats, added sugars, or sodium. Maintain a healthy weight Body mass index (BMI) is used to identify weight problems. It estimates body fat based on height and weight. Your health care provider can help determine your BMI and help you achieve or maintain a healthy weight. Get regular exercise Get regular exercise. This is one of the most important things you can do for your health. Most adults should:  Exercise for at least 150 minutes each week. The exercise should increase your heart rate and make you sweat (moderate-intensity exercise).  Do strengthening exercises at least twice a week. This is in addition to the moderate-intensity exercise.  Spend less time sitting. Even light physical activity can be beneficial. Watch cholesterol and blood lipids Have your blood tested for lipids and cholesterol at 65 years of age, then have this test every 5 years. Have your cholesterol levels checked more often if:  Your lipid or cholesterol levels are high.  You are older than 65 years of age.  You are at  high risk for heart disease. What should I know about cancer screening? Depending on your health history and family history, you may need to have cancer screening at various ages. This may include screening for:  Breast cancer.  Cervical cancer.  Colorectal cancer.  Skin cancer.  Lung cancer. What should I know about heart disease, diabetes, and high blood pressure? Blood pressure and heart disease  High blood pressure causes heart disease and increases the risk of stroke. This is more likely to develop in people who have high blood pressure readings, are of African descent, or are overweight.  Have your blood pressure checked: ? Every 3-5 years if you are 45-36 years of age. ? Every year if you are 64 years old or older. Diabetes Have regular diabetes screenings. This checks your fasting blood sugar level. Have the screening done:  Once every three years after age 58 if you are at a normal weight and have a low risk for diabetes.  More often and at a younger age if you are overweight or have a high risk for diabetes. What should I know about preventing infection? Hepatitis B If you have a higher risk for hepatitis B, you should be screened for this virus. Talk with your health care provider to find out if you are at risk for hepatitis B infection. Hepatitis C Testing is recommended for:  Everyone born from [birth year] through 1966.  Anyone with known risk factors for hepatitis C. Sexually transmitted infections (STIs)  Get screened for STIs, including gonorrhea and  chlamydia, if: ? You are sexually active and are younger than 65 years of age. ? You are older than 65 years of age and your health care provider tells you that you are at risk for this type of infection. ? Your sexual activity has changed since you were last screened, and you are at increased risk for chlamydia or gonorrhea. Ask your health care provider if you are at risk.  Ask your health care provider about whether  you are at high risk for HIV. Your health care provider may recommend a prescription medicine to help prevent HIV infection. If you choose to take medicine to prevent HIV, you should first get tested for HIV. You should then be tested every 3 months for as long as you are taking the medicine. Pregnancy  If you are about to stop having your period (premenopausal) and you may become pregnant, seek counseling before you get pregnant.  Take 400 to 800 micrograms (mcg) of folic acid every day if you become pregnant.  Ask for birth control (contraception) if you want to prevent pregnancy. Osteoporosis and menopause Osteoporosis is a disease in which the bones lose minerals and strength with aging. This can result in bone fractures. If you are 68 years old or older, or if you are at risk for osteoporosis and fractures, ask your health care provider if you should:  Be screened for bone loss.  Take a calcium or vitamin D supplement to lower your risk of fractures.  Be given hormone replacement therapy (HRT) to treat symptoms of menopause. Follow these instructions at home: Lifestyle  Do not use any products that contain nicotine or tobacco, such as cigarettes, e-cigarettes, and chewing tobacco. If you need help quitting, ask your health care provider.  Do not use street drugs.  Do not share needles.  Ask your health care provider for help if you need support or information about quitting drugs. Alcohol use  Do not drink alcohol if: ? Your health care provider tells you not to drink. ? You are pregnant, may be pregnant, or are planning to become pregnant.  If you drink alcohol: ? Limit how much you use to 0-1 drink a day. ? Limit intake if you are breastfeeding.  Be aware of how much alcohol is in your drink. In the [place] one drink equals one 12 oz bottle of beer (355 mL), one 5 oz glass of wine (148 mL), or one 1 oz glass of hard liquor (44 mL). General instructions  Schedule regular  health, dental, and eye exams.  Stay current with your vaccines.  Tell your health care provider if: ? You often feel depressed. ? You have ever been abused or do not feel safe at home. Summary  Adopting a healthy lifestyle and getting preventive care are important in promoting health and wellness.  Follow your health care provider's instructions about healthy diet, exercising, and getting tested or screened for diseases.  Follow your health care provider's instructions on monitoring your cholesterol and blood pressure. This information is not intended to replace advice given to you by your health care provider. Make sure you discuss any questions you have with your health care provider. Document Revised: 09/01/2018 Document Reviewed: 09/01/2018 Elsevier Patient Education  2020 [employer].

## 2020-06-22 ENCOUNTER — Ambulatory Visit (INDEPENDENT_AMBULATORY_CARE_PROVIDER_SITE_OTHER): Payer: 59 | Admitting: Internal Medicine

## 2020-06-22 ENCOUNTER — Other Ambulatory Visit: Payer: Self-pay

## 2020-06-22 ENCOUNTER — Encounter: Payer: Self-pay | Admitting: Internal Medicine

## 2020-06-22 VITALS — BP 138/82 | HR 99 | Temp 98.4°F | Wt 209.4 lb

## 2020-06-22 DIAGNOSIS — E559 Vitamin D deficiency, unspecified: Secondary | ICD-10-CM

## 2020-06-22 DIAGNOSIS — Z0000 Encounter for general adult medical examination without abnormal findings: Secondary | ICD-10-CM

## 2020-06-22 DIAGNOSIS — M25511 Pain in right shoulder: Secondary | ICD-10-CM

## 2020-06-22 DIAGNOSIS — E538 Deficiency of other specified B group vitamins: Secondary | ICD-10-CM

## 2020-06-22 DIAGNOSIS — R7303 Prediabetes: Secondary | ICD-10-CM

## 2020-06-22 DIAGNOSIS — Z720 Tobacco use: Secondary | ICD-10-CM

## 2020-06-22 NOTE — Assessment & Plan Note (Addendum)
Chronic Vitamin d level is low She has not been taking her vitamin D daily  - will take daily and if she does not will take two at a time when she does take it.

## 2020-06-23 DIAGNOSIS — M25511 Pain in right shoulder: Secondary | ICD-10-CM | POA: Insufficient documentation

## 2020-06-23 NOTE — Assessment & Plan Note (Signed)
Lab Results  Component Value Date   HGBA1C 6.5 06/12/2020   Sugars in diabetic range Stressed exercise, low sugar carb diet, decreasing alcohol intake Follow up in 6 months

## 2020-06-23 NOTE — Assessment & Plan Note (Signed)
Chronic B12 on the low side Advised to switch from oral pills to liquid to see if that is better absorbed

## 2020-06-23 NOTE — Assessment & Plan Note (Signed)
Acute Pain in right shoulder - no injury - ? Coming from increased tension in the neck and upper back She is concerned this could possibly be a reoccurrence of her breast cancer, which I do not think is the case We will check an x-ray today If no improvement she will let me know so I can refer her to sports medicine/orthopedics

## 2020-06-23 NOTE — Assessment & Plan Note (Signed)
Smoking cessation was discussed for more than 3 minutes.  The patient was counseled on the dangers of tobacco use, and was advised to quit and reluctant to quit.  She has a lot of stress right now and she is not sure she can quit.  Reviewed ways of quitting smoking including nicotine replacement, vapping/e-cigarettes, cold [place], weaning off cigarettes, and pharmacotherapy (wellbutrin and chantix).  She knows she needs to quit and ultimately does want to quit, but is not 100% committed to quitting. Discussed the importance of being ready to quit before trying.  Discussed the importance of decreasing alcohol intake which will decrease her smoking.

## 2020-06-25 ENCOUNTER — Telehealth: Payer: Self-pay | Admitting: Emergency Medicine

## 2020-06-25 NOTE — Telephone Encounter (Signed)
Pt called and stated she is suppose to have an xray done on her left ankle as well as her shoulder. There is an order in for shoulder but not her ankle. Can we get that order put in for her? Please call patient when completed. Thanks.

## 2020-06-26 ENCOUNTER — Encounter: Payer: Self-pay | Admitting: Internal Medicine

## 2020-06-26 DIAGNOSIS — M25471 Effusion, right ankle: Secondary | ICD-10-CM

## 2020-06-26 NOTE — Telephone Encounter (Signed)
Patient has sent my-chart message already and this was sent to Dr. Quay Burow today to address.

## 2020-06-29 ENCOUNTER — Encounter: Payer: Self-pay | Admitting: Internal Medicine

## 2020-12-21 ENCOUNTER — Ambulatory Visit: Payer: 59 | Admitting: Internal Medicine

## 2020-12-31 ENCOUNTER — Ambulatory Visit (INDEPENDENT_AMBULATORY_CARE_PROVIDER_SITE_OTHER): Payer: 59

## 2020-12-31 ENCOUNTER — Encounter: Payer: Self-pay | Admitting: Internal Medicine

## 2020-12-31 ENCOUNTER — Ambulatory Visit: Payer: 59 | Admitting: Internal Medicine

## 2020-12-31 ENCOUNTER — Other Ambulatory Visit: Payer: Self-pay

## 2020-12-31 VITALS — BP 122/90 | HR 98 | Temp 98.1°F | Resp 18 | Ht 68.0 in | Wt 204.8 lb

## 2020-12-31 DIAGNOSIS — E559 Vitamin D deficiency, unspecified: Secondary | ICD-10-CM | POA: Diagnosis not present

## 2020-12-31 DIAGNOSIS — Z853 Personal history of malignant neoplasm of breast: Secondary | ICD-10-CM

## 2020-12-31 DIAGNOSIS — M810 Age-related osteoporosis without current pathological fracture: Secondary | ICD-10-CM

## 2020-12-31 DIAGNOSIS — R7303 Prediabetes: Secondary | ICD-10-CM

## 2020-12-31 DIAGNOSIS — M25471 Effusion, right ankle: Secondary | ICD-10-CM | POA: Diagnosis not present

## 2020-12-31 DIAGNOSIS — F419 Anxiety disorder, unspecified: Secondary | ICD-10-CM

## 2020-12-31 DIAGNOSIS — Z720 Tobacco use: Secondary | ICD-10-CM

## 2020-12-31 LAB — COMPREHENSIVE METABOLIC PANEL
ALT: 26 U/L (ref 0–35)
AST: 21 U/L (ref 0–37)
Albumin: 3.9 g/dL (ref 3.5–5.2)
Alkaline Phosphatase: 61 U/L (ref 39–117)
BUN: 14 mg/dL (ref 6–23)
CO2: 34 mEq/L — ABNORMAL HIGH (ref 19–32)
Calcium: 8.9 mg/dL (ref 8.4–10.5)
Chloride: 102 mEq/L (ref 96–112)
Creatinine, Ser: 0.85 mg/dL (ref 0.40–1.20)
GFR: 71.62 mL/min (ref >60.00)
Glucose, Bld: 95 mg/dL (ref 70–99)
Potassium: 4.8 mEq/L (ref 3.5–5.1)
Sodium: 141 mEq/L (ref 135–145)
Total Bilirubin: 0.8 mg/dL (ref 0.2–1.2)
Total Protein: 6.7 g/dL (ref 6.0–8.3)

## 2020-12-31 LAB — VITAMIN D 25 HYDROXY (VIT D DEFICIENCY, FRACTURES): VITD: 34.31 ng/mL (ref 30.00–100.00)

## 2020-12-31 LAB — HEMOGLOBIN A1C: Hgb A1c MFr Bld: 6.5 % (ref 4.6–6.5)

## 2020-12-31 MED ORDER — DIAZEPAM 5 MG PO TABS: 5.0000 mg | ORAL_TABLET | Freq: Every day | ORAL | 0 refills | Status: AC | PRN

## 2020-12-31 NOTE — Assessment & Plan Note (Signed)
Checking HgA1c. 

## 2020-12-31 NOTE — Assessment & Plan Note (Signed)
Checking vitamin D level and advised to double to 2000 units daily vitamin D.

## 2020-12-31 NOTE — Assessment & Plan Note (Signed)
Checking x-ray today.

## 2020-12-31 NOTE — Assessment & Plan Note (Signed)
Counseled to quit and she feels unable at this time.

## 2020-12-31 NOTE — Progress Notes (Signed)
   Subjective:   Patient ID: Stacey [name], female    DOB: June 03, 1956, 65 y.o.   MRN: [id number]  HPI The patient is a 65 YO female coming in for concerns about recent bone density (was told she has osteoporosis by ob/gyn and did not follow up with endo as recommended, is scared by the medications, wants to review risk of fractures and if she needs treatment) and left femur lesion (found on DEXA, no pain in the area, some left knee pain, denies loss of weight) and sugars (pre-diabetes mostly, most recent elevated some, denies change in diet recently, not exercising much, having a lot of life stress), and anxiety (uses rare valium, last rx 2019, still has a few but the pills are not good any more, denies SI/HI, is coping okay but a lot of stress), and smoking (still smoking, denies being able to quit currently due to stress).   Review of Systems  Constitutional: Negative.   HENT: Negative.   Eyes: Negative.   Respiratory: Negative for cough, chest tightness and shortness of breath.   Cardiovascular: Negative for chest pain, palpitations and leg swelling.  Gastrointestinal: Negative for abdominal distention, abdominal pain, constipation, diarrhea, nausea and vomiting.  Musculoskeletal: Negative.   Skin: Negative.   Neurological: Negative.   Psychiatric/Behavioral: Negative.     Objective:  Physical Exam Constitutional:      Appearance: She is well-developed.  HENT:     Head: Normocephalic and atraumatic.  Cardiovascular:     Rate and Rhythm: Normal rate and regular rhythm.  Pulmonary:     Effort: Pulmonary effort is normal. No respiratory distress.     Breath sounds: Normal breath sounds. No wheezing or rales.  Abdominal:     General: Bowel sounds are normal. There is no distension.     Palpations: Abdomen is soft.     Tenderness: There is no abdominal tenderness. There is no rebound.  Musculoskeletal:     Cervical back: Normal range of motion.  Skin:    General: Skin is warm and  dry.  Neurological:     Mental Status: She is alert and oriented to person, place, and time.     Coordination: Coordination normal.     Vitals:   12/31/20 0818  BP: 122/90  Pulse: 98  Resp: 18  Temp: 98.1 F (36.7 C)  TempSrc: Oral  SpO2: 90%  Weight: 204 lb 12.8 oz (92.9 kg)  Height: 5\' 8"  (1.727 m)    This visit occurred during the SARS-CoV-2 public health emergency.  Safety protocols were in place, including screening questions prior to the visit, additional usage of staff PPE, and extensive cleaning of exam room while observing appropriate contact time as indicated for disinfecting solutions.   Assessment & Plan:  Visit time 25 minutes in face to face communication with patient and coordination of care, additional 15 minutes spent in record review, coordination or care, ordering tests, communicating/referring to other healthcare professionals, documenting in medical records all on the same day of the visit for total time 40 minutes spent on the visit.

## 2020-12-31 NOTE — Patient Instructions (Signed)
We will check the labs today.  We will check the x-ray of the hip and the right ankle.

## 2020-12-31 NOTE — Assessment & Plan Note (Signed)
Checking left hip x-ray to follow up on new finding on DEXA which was not well assessed.

## 2020-12-31 NOTE — Assessment & Plan Note (Signed)
Rare valium and last fill 2019. Will refill for #30 no refills.

## 2020-12-31 NOTE — Assessment & Plan Note (Signed)
Calculated frax major fracture 13% and hip 3.9% so meets criteria for treatment. We did discuss and she elects to wait 2 years and recheck DEXA and if worsening will try treatment. Due Oct 2023.

## 2021-08-19 ENCOUNTER — Telehealth: Payer: Self-pay | Admitting: Internal Medicine

## 2021-08-19 NOTE — Telephone Encounter (Signed)
Would recommend visit with [place] or with sports medicine if she wants to be assessed for gout. Usually rheumatology is not needed to treat gout.

## 2021-08-19 NOTE — Telephone Encounter (Signed)
See below

## 2021-08-19 NOTE — Telephone Encounter (Signed)
Patient calling in  Says she is having a constant pain in right foot & it stays swollen often (believes it may be gout)  Would like a referral to Rheumatology Dr. Vernelle Emerald   Please call patient when referral has been complete

## 2021-08-20 ENCOUNTER — Encounter: Payer: Self-pay | Admitting: Internal Medicine

## 2021-08-20 NOTE — Telephone Encounter (Signed)
See my chart message

## 2021-08-27 ENCOUNTER — Encounter: Payer: Self-pay | Admitting: Internal Medicine

## 2021-08-27 ENCOUNTER — Ambulatory Visit: Payer: BC Managed Care – PPO | Admitting: Internal Medicine

## 2021-08-27 ENCOUNTER — Other Ambulatory Visit: Payer: Self-pay

## 2021-08-27 VITALS — BP 118/80 | HR 91 | Resp 18 | Ht 68.0 in | Wt 215.2 lb

## 2021-08-27 DIAGNOSIS — M2550 Pain in unspecified joint: Secondary | ICD-10-CM | POA: Diagnosis not present

## 2021-08-27 DIAGNOSIS — Z23 Encounter for immunization: Secondary | ICD-10-CM | POA: Diagnosis not present

## 2021-08-27 DIAGNOSIS — Z720 Tobacco use: Secondary | ICD-10-CM

## 2021-08-27 NOTE — Patient Instructions (Addendum)
We will check the labs today.  Think about the lung cancer screening.

## 2021-08-27 NOTE — Progress Notes (Signed)
   Subjective:   Patient ID: Stacey [name], female    DOB: 12-Oct-1955, 65 y.o.   MRN: [id number]  HPI The patient is a 65 YO female coming in for concerns about joint problems and pain.   Review of Systems  Constitutional: Negative.   HENT: Negative.    Eyes: Negative.   Respiratory:  Negative for cough, chest tightness and shortness of breath.   Cardiovascular:  Negative for chest pain, palpitations and leg swelling.  Gastrointestinal:  Negative for abdominal distention, abdominal pain, constipation, diarrhea, nausea and vomiting.  Musculoskeletal:  Positive for arthralgias and myalgias.  Skin: Negative.   Neurological: Negative.   Psychiatric/Behavioral: Negative.     Objective:  Physical Exam Constitutional:      Appearance: She is well-developed.  HENT:     Head: Normocephalic and atraumatic.  Cardiovascular:     Rate and Rhythm: Normal rate and regular rhythm.  Pulmonary:     Effort: Pulmonary effort is normal. No respiratory distress.     Breath sounds: Normal breath sounds. No wheezing or rales.  Abdominal:     General: Bowel sounds are normal. There is no distension.     Palpations: Abdomen is soft.     Tenderness: There is no abdominal tenderness. There is no rebound.  Musculoskeletal:        General: Tenderness present.     Cervical back: Normal range of motion.     Comments: No redness or swelling to joints.   Skin:    General: Skin is warm and dry.  Neurological:     Mental Status: She is alert and oriented to person, place, and time.     Coordination: Coordination normal.    Vitals:   08/27/21 1524  BP: 118/80  Pulse: 91  Resp: 18  SpO2: 97%  Weight: 215 lb 3.2 oz (97.6 kg)  Height: 5\' 8"  (1.727 m)   Flu shot given at visit  This visit occurred during the SARS-CoV-2 public health emergency.  Safety protocols were in place, including screening questions prior to the visit, additional usage of staff PPE, and extensive cleaning of exam room while  observing appropriate contact time as indicated for disinfecting solutions.   Assessment & Plan:  Visit time 25 minutes in face to face communication with patient and coordination of care, additional 10 minutes spent in record review, coordination or care, ordering tests, communicating/referring to other healthcare professionals, documenting in medical records all on the same day of the visit for total time 35 minutes spent on the visit.

## 2021-08-28 DIAGNOSIS — M2550 Pain in unspecified joint: Secondary | ICD-10-CM | POA: Insufficient documentation

## 2021-08-28 NOTE — Assessment & Plan Note (Signed)
She has concerns due to high amount of cancer in her family and a close friend passing away of sarcoma. Reviewed x-rays of hips and foot with her as these are some of the more painful joints. There are no signs of cancer and this would be detected. Checking ANA and RF to sreen for autoimmune disease. She had requested referral to rheumatology however she does not have typical signs for auto-immune arthritis and may be better served by seeing sports medicine or orthopedics unless some of this workup comes back positive. If so she prefers [employer].

## 2021-08-28 NOTE — Assessment & Plan Note (Signed)
Advised to quit and she is not able at this time. Offered lung cancer screening and she is not sure she wants that at this time. She does qualify and if she changes her mind she will let us know.

## 2021-08-29 LAB — ANA, IFA COMPREHENSIVE PANEL
Anti Nuclear Antibody (ANA): NEGATIVE
ENA SM Ab Ser-aCnc: <1 AI
SM/RNP: <1 AI
SSA (Ro) (ENA) Antibody, IgG: <1 AI
SSB (La) (ENA) Antibody, IgG: <1 AI
Scleroderma (Scl-70) (ENA) Antibody, IgG: <1 AI
ds DNA Ab: <1 IU/mL

## 2021-08-29 LAB — RHEUMATOID FACTOR: Rheumatoid fact SerPl-aCnc: <14 IU/mL (ref <14)

## 2022-01-29 ENCOUNTER — Encounter: Payer: Self-pay | Admitting: Internal Medicine

## 2022-01-29 DIAGNOSIS — R7303 Prediabetes: Secondary | ICD-10-CM

## 2022-01-29 DIAGNOSIS — R5383 Other fatigue: Secondary | ICD-10-CM

## 2022-01-29 DIAGNOSIS — M810 Age-related osteoporosis without current pathological fracture: Secondary | ICD-10-CM

## 2022-02-11 ENCOUNTER — Encounter: Payer: BC Managed Care – PPO | Admitting: Internal Medicine

## 2022-02-18 ENCOUNTER — Encounter: Payer: Self-pay | Admitting: Internal Medicine

## 2022-02-18 DIAGNOSIS — Z1231 Encounter for screening mammogram for malignant neoplasm of breast: Secondary | ICD-10-CM | POA: Diagnosis not present

## 2022-02-28 ENCOUNTER — Other Ambulatory Visit (INDEPENDENT_AMBULATORY_CARE_PROVIDER_SITE_OTHER): Payer: Medicare Other

## 2022-02-28 DIAGNOSIS — R7303 Prediabetes: Secondary | ICD-10-CM

## 2022-02-28 DIAGNOSIS — R5383 Other fatigue: Secondary | ICD-10-CM

## 2022-02-28 DIAGNOSIS — M810 Age-related osteoporosis without current pathological fracture: Secondary | ICD-10-CM

## 2022-02-28 LAB — LIPID PANEL
Cholesterol: 171 mg/dL (ref 0–200)
HDL: 68 mg/dL (ref >39.00)
LDL Cholesterol: 86 mg/dL (ref 0–99)
NonHDL: 103.16
Total CHOL/HDL Ratio: 3
Triglycerides: 86 mg/dL (ref 0.0–149.0)
VLDL: 17.2 mg/dL (ref 0.0–40.0)

## 2022-02-28 LAB — COMPREHENSIVE METABOLIC PANEL
ALT: 25 U/L (ref 0–35)
AST: 21 U/L (ref 0–37)
Albumin: 4 g/dL (ref 3.5–5.2)
Alkaline Phosphatase: 58 U/L (ref 39–117)
BUN: 16 mg/dL (ref 6–23)
CO2: 34 mEq/L — ABNORMAL HIGH (ref 19–32)
Calcium: 9.2 mg/dL (ref 8.4–10.5)
Chloride: 101 mEq/L (ref 96–112)
Creatinine, Ser: 0.82 mg/dL (ref 0.40–1.20)
GFR: 74.17 mL/min (ref >60.00)
Glucose, Bld: 102 mg/dL — ABNORMAL HIGH (ref 70–99)
Potassium: 4.7 mEq/L (ref 3.5–5.1)
Sodium: 141 mEq/L (ref 135–145)
Total Bilirubin: 0.8 mg/dL (ref 0.2–1.2)
Total Protein: 6.7 g/dL (ref 6.0–8.3)

## 2022-02-28 LAB — CBC
HCT: 48.5 % — ABNORMAL HIGH (ref 36.0–46.0)
Hemoglobin: 16 g/dL — ABNORMAL HIGH (ref 12.0–15.0)
MCHC: 32.9 g/dL (ref 30.0–36.0)
MCV: 98.9 fl (ref 78.0–100.0)
Platelets: 162 10*3/uL (ref 150.0–400.0)
RBC: 4.9 Mil/uL (ref 3.87–5.11)
RDW: 14.3 % (ref 11.5–15.5)
WBC: 6.1 10*3/uL (ref 4.0–10.5)

## 2022-02-28 LAB — TSH: TSH: 1.77 u[IU]/mL (ref 0.35–5.50)

## 2022-02-28 LAB — HEMOGLOBIN A1C: Hgb A1c MFr Bld: 6.2 % (ref 4.6–6.5)

## 2022-03-07 ENCOUNTER — Ambulatory Visit (INDEPENDENT_AMBULATORY_CARE_PROVIDER_SITE_OTHER): Payer: Medicare Other | Admitting: Internal Medicine

## 2022-03-07 ENCOUNTER — Encounter: Payer: Self-pay | Admitting: Internal Medicine

## 2022-03-07 VITALS — BP 138/82 | HR 92 | Temp 98.0°F | Ht 68.0 in | Wt 213.4 lb

## 2022-03-07 DIAGNOSIS — Z720 Tobacco use: Secondary | ICD-10-CM

## 2022-03-07 DIAGNOSIS — R0902 Hypoxemia: Secondary | ICD-10-CM | POA: Diagnosis not present

## 2022-03-07 DIAGNOSIS — R7303 Prediabetes: Secondary | ICD-10-CM | POA: Diagnosis not present

## 2022-03-07 DIAGNOSIS — M810 Age-related osteoporosis without current pathological fracture: Secondary | ICD-10-CM

## 2022-03-07 DIAGNOSIS — Z0000 Encounter for general adult medical examination without abnormal findings: Secondary | ICD-10-CM | POA: Diagnosis not present

## 2022-03-07 DIAGNOSIS — E538 Deficiency of other specified B group vitamins: Secondary | ICD-10-CM

## 2022-03-07 NOTE — Assessment & Plan Note (Addendum)
She has nail polish on and unclear if her O2 sat 85% is accurate. We will have her remove polish and return next week for recheck on nurse visit. If persistently low this will need evaluation. She did have scarring/copd on x-ray 2017. She is unsure she wants to pursue CT lung cancer screening. EKG done today to rule out ACS and this is not changed from 2019 and left axis which has been present previously.

## 2022-03-07 NOTE — Assessment & Plan Note (Signed)
Will be due dexa Oct 2023. Did meet criteria for treatment but she elected to monitor.

## 2022-03-07 NOTE — Assessment & Plan Note (Signed)
Flu shot declines. covid-19 counseled. Pneumonia declines. Shingrix declines. Tetanus declines. Colonoscopy due 2027. Mammogram due 2025, pap smear aged out and dexa due Oct 2023. Counseled about sun safety and mole surveillance. Counseled about the dangers of distracted driving. Given 10 year screening recommendations.

## 2022-03-07 NOTE — Assessment & Plan Note (Signed)
Reviewed recent labs 6.2 HgA1c and counseled on avoidance methods to avoid progression to diabetes.

## 2022-03-07 NOTE — Patient Instructions (Signed)
We will recheck the oxygen levels in 1-2 weeks to check this.   The EKG is normal today.  Think about the lung cancer screening CT scan.

## 2022-03-07 NOTE — Assessment & Plan Note (Signed)
Taking otc and no levels since last year. She is having some numbness in left hand offered to check levels and she declines as she just had labs today.

## 2022-03-07 NOTE — Progress Notes (Signed)
Subjective:   Patient ID: Stacey [name], female    DOB: [birth year], 66 y.o.   MRN: [id number]  HPI Here for medicare wellness and physical, with new complaints. Please see A/P for status and treatment of chronic medical problems.   Diet: heart healthy Physical activity: sedentary Depression/mood screen: negative Hearing: intact to whispered voice Visual acuity: grossly normal with lens, performs annual eye exam  ADLs: capable Fall risk: none Home safety: good Cognitive evaluation: intact to orientation, naming, recall and repetition EOL planning: adv directives discussed  [place] Visit from 03/07/2022 in [place] at [facility]  PHQ-2 Total Score 0           06/22/2020    2:42 PM 08/27/2021    3:25 PM 03/07/2022    8:15 AM  [name] in the past year? 0 0 0  Was there an injury with Fall? 0 0   Fall Risk Category Calculator 0 0   Fall Risk Category Low Low   Patient Fall Risk Level Low fall risk Low fall risk     I have personally reviewed and have noted 1. The patient's medical and social history - reviewed today no changes 2. Their use of alcohol, tobacco or illicit drugs 3. Their current medications and supplements 4. The patient's functional ability including ADL's, fall risks, home safety risks and hearing or visual impairment. 5. Diet and physical activities 6. Evidence for depression or mood disorders 7. Care team reviewed and updated 8.  The patient is not on an opioid pain medication.  Patient Care Team: Hoyt Koch, MD as PCP - General (Internal Medicine) Milus Banister, MD (Gastroenterology) Rhona Raider, Lincoln Brigham., MD (Hematology and Oncology) Past Medical History:  Diagnosis Date   Breast cancer [facility]) 2008   L side, s/p lumpectomy, chemotherapy and radiation   Past Surgical History:  Procedure Laterality Date   BREAST LUMPECTOMY  2008   Family History  Problem Relation Age of Onset   Diabetes Other     Diabetes Mother    Heart disease Father    Breast cancer Sister    Heart disease Brother    Breast cancer Paternal Aunt    Colon cancer Paternal Aunt    Heart disease Maternal Grandfather    Celiac disease Paternal Grandmother    Heart disease Paternal Grandfather    EKG: Rate 83, axis left, interval normal, sinus, no st or t wave changes, no significant change compared to prior 2019  Review of Systems  Constitutional:  Positive for fatigue.  HENT: Negative.    Eyes: Negative.   Respiratory:  Positive for cough. Negative for chest tightness and shortness of breath.   Cardiovascular:  Negative for chest pain, palpitations and leg swelling.  Gastrointestinal:  Negative for abdominal distention, abdominal pain, constipation, diarrhea, nausea and vomiting.  Musculoskeletal:  Positive for arthralgias.  Skin: Negative.   Neurological: Negative.   Psychiatric/Behavioral: Negative.      Objective:  Physical Exam Constitutional:      Appearance: She is well-developed.  HENT:     Head: Normocephalic and atraumatic.  Cardiovascular:     Rate and Rhythm: Normal rate and regular rhythm.  Pulmonary:     Effort: Pulmonary effort is normal. No respiratory distress.     Breath sounds: Normal breath sounds. No wheezing or rales.  Abdominal:     General: Bowel sounds are normal. There is no distension.     Palpations: Abdomen is soft.  Tenderness: There is no abdominal tenderness. There is no rebound.  Musculoskeletal:        General: Tenderness present.     Cervical back: Normal range of motion.  Skin:    General: Skin is warm and dry.  Neurological:     Mental Status: She is alert and oriented to person, place, and time.     Coordination: Coordination normal.     Vitals:   03/07/22 0809  BP: 138/82  Pulse: 92  Temp: 98 F (36.7 C)  TempSrc: Oral  SpO2: (!) 85%  Weight: 213 lb 6 oz (96.8 kg)  Height: '5\' 8"'$  (1.727 m)    Assessment & Plan:

## 2022-03-07 NOTE — Assessment & Plan Note (Signed)
Advised to quit smoking and she is going to try.

## 2022-03-13 ENCOUNTER — Ambulatory Visit: Payer: Medicare Other

## 2022-03-13 NOTE — Progress Notes (Cosign Needed)
Pt came into the office today to have her o2 stat rechecked. Her o2 stat at present is 91%. She did remove nail polish off of 2 of fingers. She would like to know would losing weight make her oxygen levels come up. I advised that smoking be part of the problem.

## 2022-05-25 IMAGING — DX DG HIP (WITH OR WITHOUT PELVIS) 3-4V BILAT
5 series · 5 of 5 positions shown · non-contrast
Comparison: Bone densitometry study July 13, 2020

CLINICAL DATA: Abnormal sclerotic focus left proximal femur seen on
prior densitometry study. History of breast carcinoma

EXAM:
DG HIP (WITH OR WITHOUT PELVIS) 3-4V BILAT

[pelvis ap]
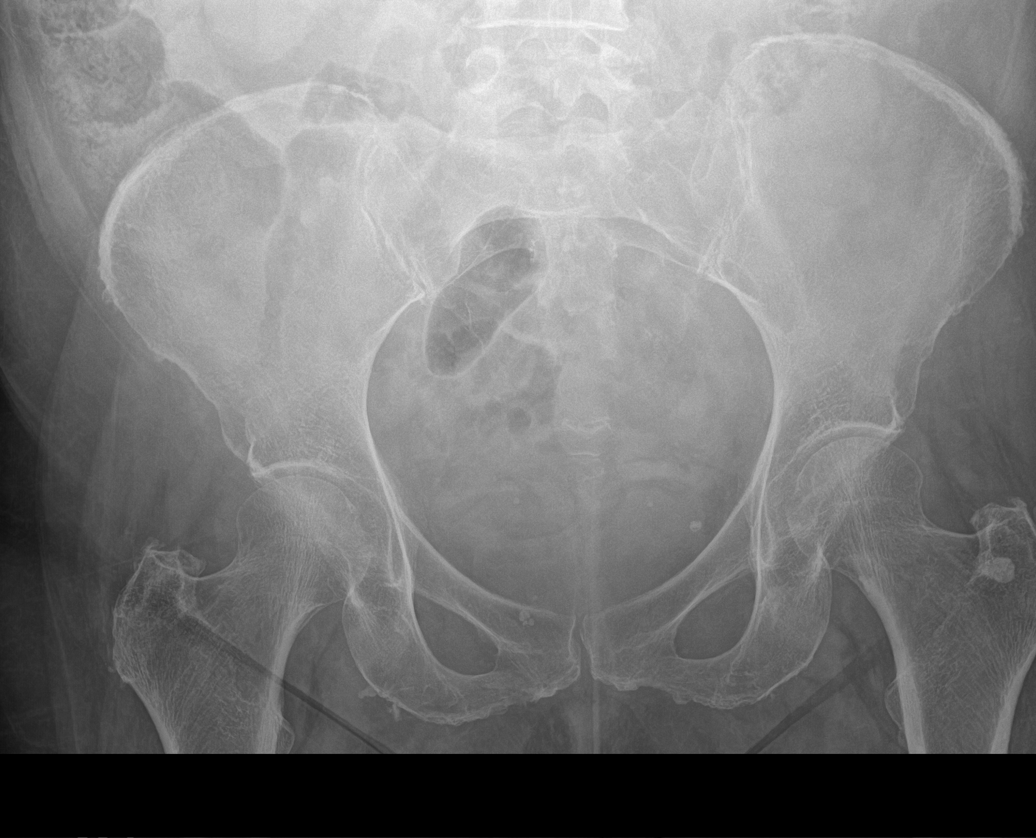

[hip ap (1 of 2)]
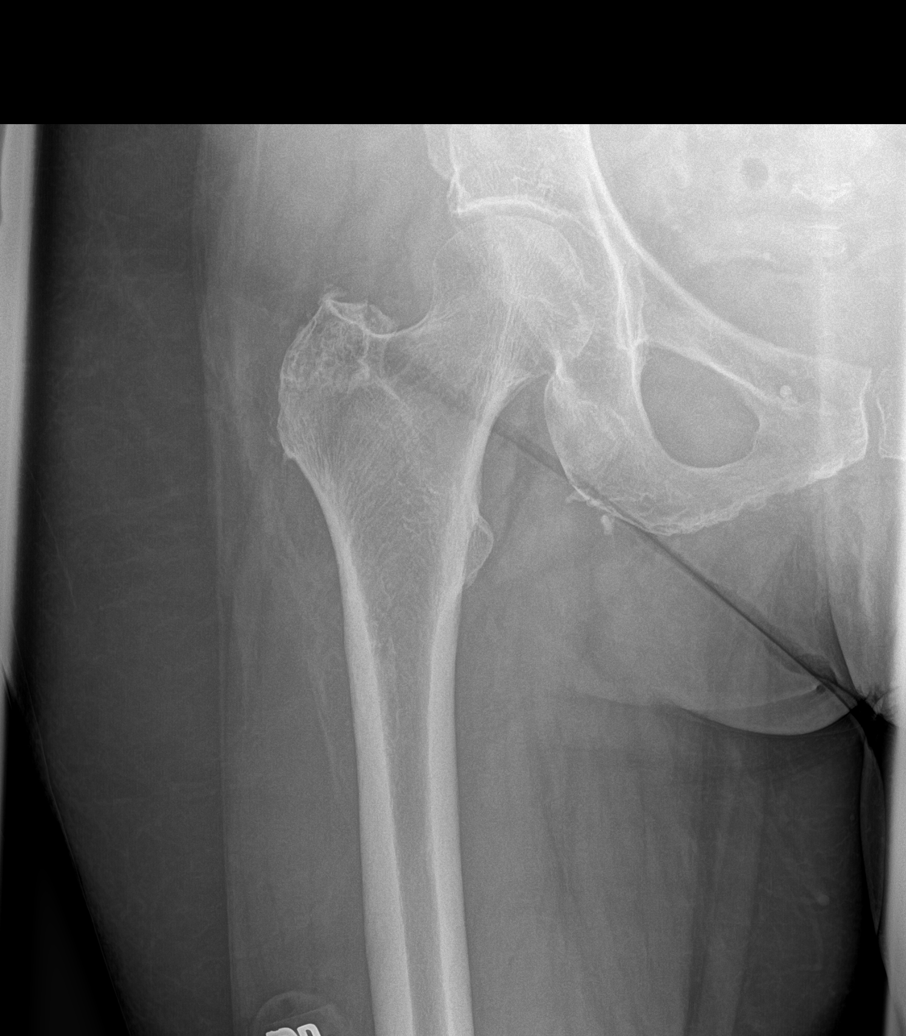

[hip ap (2 of 2)]
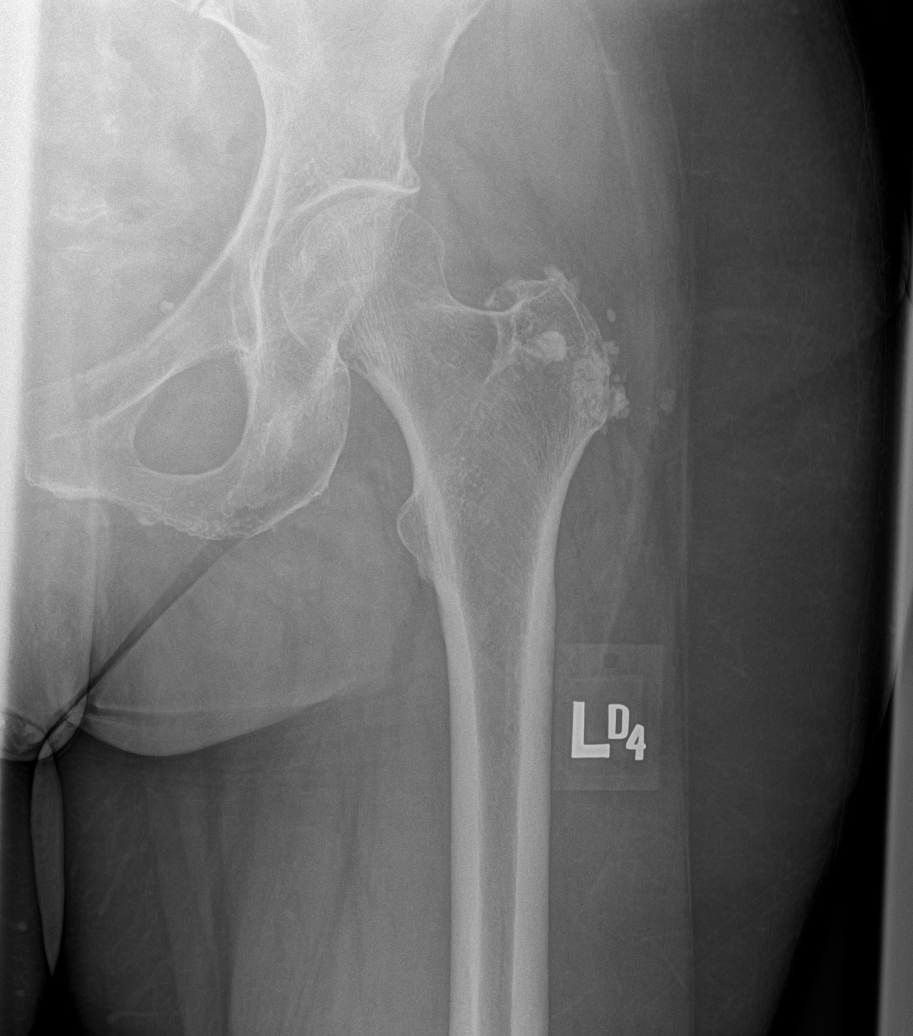

[hip frog leg (1 of 2)]
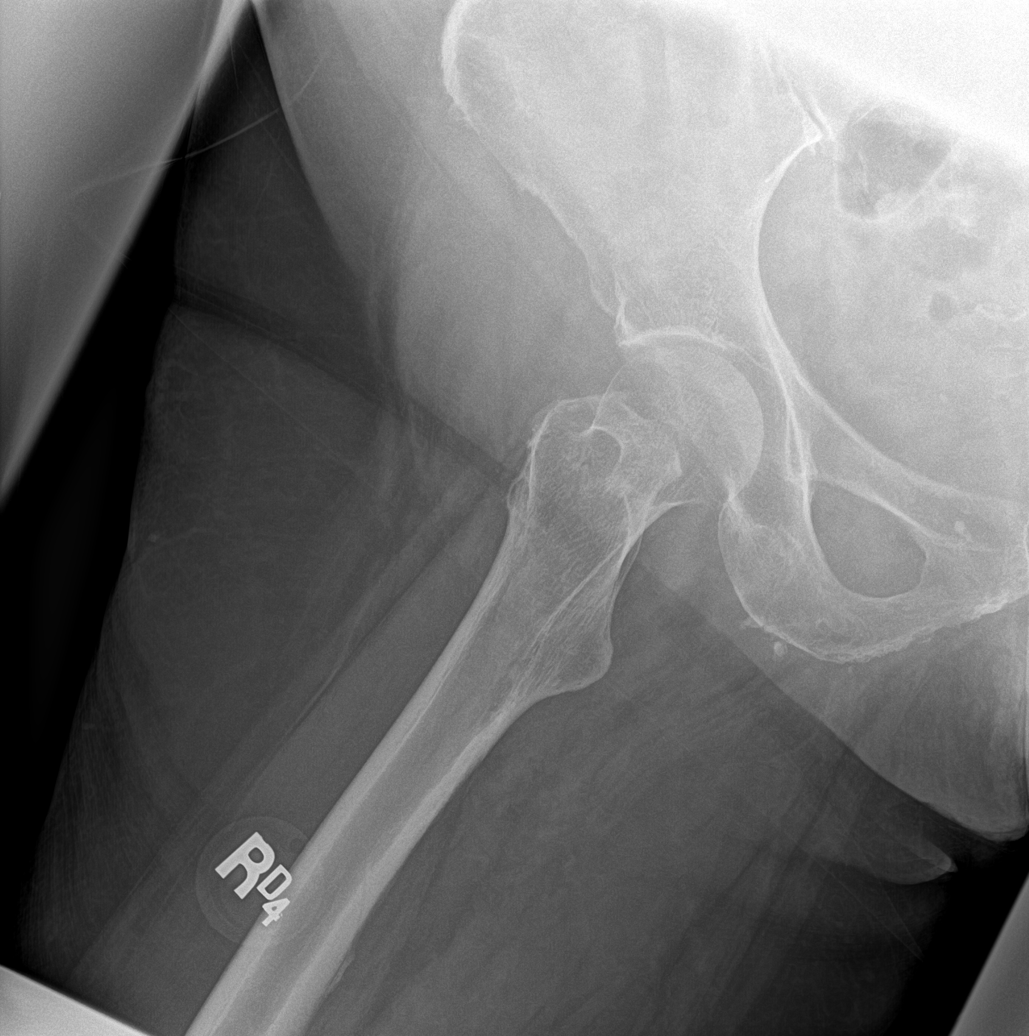

[hip frog leg (2 of 2)]
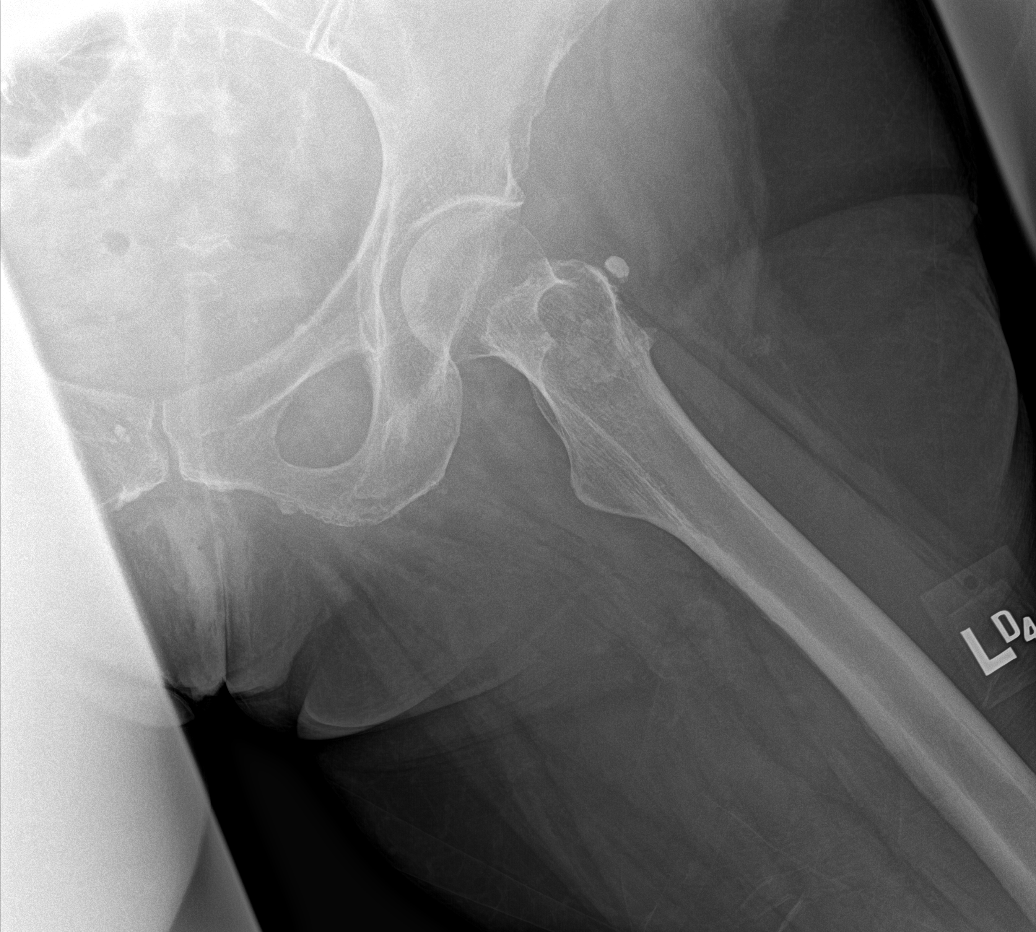

[5 of 5 positions shown; findings below may reference images not displayed]

FINDINGS: Frontal pelvis as well as frontal and lateral left hip images were
obtained. The area of increased opacity overlying the greater
trochanter on prior densitometry study is noted on lateral view to
arise adjacent to but apart from bone. No blastic or lytic bone
lesions. Several calcifications are noted adjacent to the greater
trochanter. There is no fracture or dislocation. There is mild
symmetric narrowing of each hip joint.
IMPRESSION: The increased opacity noted on the densitometry study actually
resides immediately adjacent to but apart from the greater
trochanter. No blastic or lytic bone lesions. Mild symmetric
narrowing each hip joint. No fracture or dislocation.

## 2022-07-10 DIAGNOSIS — M7062 Trochanteric bursitis, left hip: Secondary | ICD-10-CM | POA: Diagnosis not present

## 2022-07-14 DIAGNOSIS — M7062 Trochanteric bursitis, left hip: Secondary | ICD-10-CM | POA: Diagnosis not present

## 2022-10-24 ENCOUNTER — Ambulatory Visit: Payer: Medicare Other | Admitting: Internal Medicine

## 2022-11-05 ENCOUNTER — Telehealth: Payer: Self-pay | Admitting: Internal Medicine

## 2022-11-05 ENCOUNTER — Encounter: Payer: Self-pay | Admitting: Internal Medicine

## 2022-11-05 NOTE — Telephone Encounter (Signed)
Pt wife also stated the best call back number for her is [phone]

## 2022-11-05 NOTE — Telephone Encounter (Signed)
Spoke with the wife and faxed over medication list and Dr notes for husband

## 2022-11-05 NOTE — Telephone Encounter (Signed)
Pt calling on her husband behalf wanting to talk to Dr. Sharlet Salina. Pt stated she it was personal and she don't want to repeat the personal matter.   Pt ask if  Dr. Sharlet Salina can give her a call back

## 2022-11-10 ENCOUNTER — Telehealth: Payer: Self-pay

## 2022-11-10 NOTE — Telephone Encounter (Signed)
I have placed the letter up front for pick up

## 2023-02-17 ENCOUNTER — Encounter: Payer: Self-pay | Admitting: Internal Medicine

## 2023-02-25 DIAGNOSIS — Z1231 Encounter for screening mammogram for malignant neoplasm of breast: Secondary | ICD-10-CM | POA: Diagnosis not present

## 2023-02-25 LAB — HM MAMMOGRAPHY

## 2023-03-10 ENCOUNTER — Encounter: Payer: Self-pay | Admitting: Internal Medicine

## 2023-03-10 ENCOUNTER — Ambulatory Visit (INDEPENDENT_AMBULATORY_CARE_PROVIDER_SITE_OTHER): Payer: Medicare Other | Admitting: Internal Medicine

## 2023-03-10 ENCOUNTER — Ambulatory Visit (INDEPENDENT_AMBULATORY_CARE_PROVIDER_SITE_OTHER): Payer: Medicare Other

## 2023-03-10 VITALS — BP 120/72 | HR 101 | Temp 98.7°F | Ht 68.0 in | Wt 203.0 lb

## 2023-03-10 VITALS — BP 120/72 | HR 101 | Temp 98.7°F | Ht 68.0 in | Wt 203.6 lb

## 2023-03-10 DIAGNOSIS — R221 Localized swelling, mass and lump, neck: Secondary | ICD-10-CM | POA: Diagnosis not present

## 2023-03-10 DIAGNOSIS — M810 Age-related osteoporosis without current pathological fracture: Secondary | ICD-10-CM

## 2023-03-10 DIAGNOSIS — Z720 Tobacco use: Secondary | ICD-10-CM

## 2023-03-10 DIAGNOSIS — Z13820 Encounter for screening for osteoporosis: Secondary | ICD-10-CM | POA: Diagnosis not present

## 2023-03-10 DIAGNOSIS — E559 Vitamin D deficiency, unspecified: Secondary | ICD-10-CM

## 2023-03-10 DIAGNOSIS — E538 Deficiency of other specified B group vitamins: Secondary | ICD-10-CM | POA: Diagnosis not present

## 2023-03-10 DIAGNOSIS — Z0000 Encounter for general adult medical examination without abnormal findings: Secondary | ICD-10-CM

## 2023-03-10 DIAGNOSIS — R7303 Prediabetes: Secondary | ICD-10-CM

## 2023-03-10 DIAGNOSIS — Z853 Personal history of malignant neoplasm of breast: Secondary | ICD-10-CM

## 2023-03-10 DIAGNOSIS — Z122 Encounter for screening for malignant neoplasm of respiratory organs: Secondary | ICD-10-CM

## 2023-03-10 LAB — COMPREHENSIVE METABOLIC PANEL
ALT: 24 U/L (ref 0–35)
AST: 22 U/L (ref 0–37)
Albumin: 3.9 g/dL (ref 3.5–5.2)
Alkaline Phosphatase: 60 U/L (ref 39–117)
BUN: 14 mg/dL (ref 6–23)
CO2: 31 mEq/L (ref 19–32)
Calcium: 9.5 mg/dL (ref 8.4–10.5)
Chloride: 101 mEq/L (ref 96–112)
Creatinine, Ser: 0.84 mg/dL (ref 0.40–1.20)
GFR: 71.54 mL/min (ref >60.00)
Glucose, Bld: 108 mg/dL — ABNORMAL HIGH (ref 70–99)
Potassium: 4.7 mEq/L (ref 3.5–5.1)
Sodium: 140 mEq/L (ref 135–145)
Total Bilirubin: 0.7 mg/dL (ref 0.2–1.2)
Total Protein: 7.1 g/dL (ref 6.0–8.3)

## 2023-03-10 LAB — CBC
HCT: 51.4 % — ABNORMAL HIGH (ref 36.0–46.0)
Hemoglobin: 16.9 g/dL — ABNORMAL HIGH (ref 12.0–15.0)
MCHC: 33 g/dL (ref 30.0–36.0)
MCV: 98.6 fl (ref 78.0–100.0)
Platelets: 184 10*3/uL (ref 150.0–400.0)
RBC: 5.21 Mil/uL — ABNORMAL HIGH (ref 3.87–5.11)
RDW: 13.5 % (ref 11.5–15.5)
WBC: 7 10*3/uL (ref 4.0–10.5)

## 2023-03-10 LAB — LIPID PANEL
Cholesterol: 192 mg/dL (ref 0–200)
HDL: 69.4 mg/dL (ref >39.00)
LDL Cholesterol: 101 mg/dL — ABNORMAL HIGH (ref 0–99)
NonHDL: 122.48
Total CHOL/HDL Ratio: 3
Triglycerides: 108 mg/dL (ref 0.0–149.0)
VLDL: 21.6 mg/dL (ref 0.0–40.0)

## 2023-03-10 LAB — HEMOGLOBIN A1C: Hgb A1c MFr Bld: 6.4 % (ref 4.6–6.5)

## 2023-03-10 LAB — VITAMIN D 25 HYDROXY (VIT D DEFICIENCY, FRACTURES): VITD: 58.84 ng/mL (ref 30.00–100.00)

## 2023-03-10 LAB — TSH: TSH: 1.28 u[IU]/mL (ref 0.35–5.50)

## 2023-03-10 LAB — VITAMIN B12: Vitamin B-12: 414 pg/mL (ref 211–911)

## 2023-03-10 NOTE — Patient Instructions (Addendum)
Stacey [name] , Thank you for taking time to come for your Medicare Wellness Visit. I appreciate your ongoing commitment to your health goals. Please review the following plan we discussed and let me know if I can assist you in the future.   These are the goals we discussed:  Goals      My goal is to quit smoking and lose weight.        This is a list of the screening recommended for you and due dates:  Health Maintenance  Topic Date Due   Screening for Lung Cancer  Never done   DTaP/Tdap/Td vaccine (3 - Td or Tdap) 09/22/2018   COVID-19 Vaccine (3 - 2023-24 season) 05/23/2022   Flu Shot  04/23/2023   Medicare Annual Wellness Visit  03/09/2024   Mammogram  02/24/2025   Colon Cancer Screening  04/07/2026   DEXA scan (bone density measurement)  Completed   Hepatitis C Screening  Completed   HPV Vaccine  Aged Out   Pneumonia Vaccine  Discontinued   Zoster (Shingles) Vaccine  Discontinued    Advanced directives: Yes  Conditions/risks identified: Ye; Quit smoking  Next appointment: Follow up in one year for your annual wellness visit by calling [phone].   Preventive Care 5 Years and Older, Female Preventive care refers to lifestyle choices and visits with your health care provider that can promote health and wellness. What does preventive care include? A yearly physical exam. This is also called an annual well check. Dental exams once or twice a year. Routine eye exams. Ask your health care provider how often you should have your eyes checked. Personal lifestyle choices, including: Daily care of your teeth and gums. Regular physical activity. Eating a healthy diet. Avoiding tobacco and drug use. Limiting alcohol use. Practicing safe sex. Taking low-dose aspirin every day. Taking vitamin and mineral supplements as recommended by your health care provider. What happens during an annual well check? The services and screenings done by your health care provider during your  annual well check will depend on your age, overall health, lifestyle risk factors, and family history of disease. Counseling  Your health care provider may ask you questions about your: Alcohol use. Tobacco use. Drug use. Emotional well-being. Home and relationship well-being. Sexual activity. Eating habits. History of falls. Memory and ability to understand (cognition). Work and work Astronomer. Reproductive health. Screening  You may have the following tests or measurements: Height, weight, and BMI. Blood pressure. Lipid and cholesterol levels. These may be checked every 5 years, or more frequently if you are over 2 years old. Skin check. Lung cancer screening. You may have this screening every year starting at age 44 if you have a 30-pack-year history of smoking and currently smoke or have quit within the past 15 years. Fecal occult blood test (FOBT) of the stool. You may have this test every year starting at age 24. Flexible sigmoidoscopy or colonoscopy. You may have a sigmoidoscopy every 5 years or a colonoscopy every 10 years starting at age 85. Hepatitis C blood test. Hepatitis B blood test. Sexually transmitted disease (STD) testing. Diabetes screening. This is done by checking your blood sugar (glucose) after you have not eaten for a while (fasting). You may have this done every 1-3 years. Bone density scan. This is done to screen for osteoporosis. You may have this done starting at age 20. Mammogram. This may be done every 1-2 years. Talk to your health care provider about how often you should have  regular mammograms. Talk with your health care provider about your test results, treatment options, and if necessary, the need for more tests. Vaccines  Your health care provider may recommend certain vaccines, such as: Influenza vaccine. This is recommended every year. Tetanus, diphtheria, and acellular pertussis (Tdap, Td) vaccine. You may need a Td booster every 10  years. Zoster vaccine. You may need this after age 33. Pneumococcal 13-valent conjugate (PCV13) vaccine. One dose is recommended after age 76. Pneumococcal polysaccharide (PPSV23) vaccine. One dose is recommended after age 11. Talk to your health care provider about which screenings and vaccines you need and how often you need them. This information is not intended to replace advice given to you by your health care provider. Make sure you discuss any questions you have with your health care provider. Document Released: 10/05/2015 Document Revised: 05/28/2016 Document Reviewed: 07/10/2015 Elsevier Interactive Patient Education  2017 [place] Prevention in the Home Falls can cause injuries. They can happen to people of all ages. There are many things you can do to make your home safe and to help prevent falls. What can I do on the outside of my home? Regularly fix the edges of walkways and driveways and fix any cracks. Remove anything that might make you trip as you walk through a door, such as a raised step or threshold. Trim any bushes or trees on the path to your home. Use bright outdoor lighting. Clear any walking paths of anything that might make someone trip, such as rocks or tools. Regularly check to see if handrails are loose or broken. Make sure that both sides of any steps have handrails. Any raised decks and porches should have guardrails on the edges. Have any leaves, snow, or ice cleared regularly. Use sand or salt on walking paths during winter. Clean up any spills in your garage right away. This includes oil or grease spills. What can I do in the bathroom? Use night lights. Install grab bars by the toilet and in the tub and shower. Do not use towel bars as grab bars. Use non-skid mats or decals in the tub or shower. If you need to sit down in the shower, use a plastic, non-slip stool. Keep the floor dry. Clean up any water that spills on the floor as soon as it  happens. Remove soap buildup in the tub or shower regularly. Attach bath mats securely with double-sided non-slip rug tape. Do not have throw rugs and other things on the floor that can make you trip. What can I do in the bedroom? Use night lights. Make sure that you have a light by your bed that is easy to reach. Do not use any sheets or blankets that are too big for your bed. They should not hang down onto the floor. Have a firm chair that has side arms. You can use this for support while you get dressed. Do not have throw rugs and other things on the floor that can make you trip. What can I do in the kitchen? Clean up any spills right away. Avoid walking on wet floors. Keep items that you use a lot in easy-to-reach places. If you need to reach something above you, use a strong step stool that has a grab bar. Keep electrical cords out of the way. Do not use floor polish or wax that makes floors slippery. If you must use wax, use non-skid floor wax. Do not have throw rugs and other things on the floor that  can make you trip. What can I do with my stairs? Do not leave any items on the stairs. Make sure that there are handrails on both sides of the stairs and use them. Fix handrails that are broken or loose. Make sure that handrails are as long as the stairways. Check any carpeting to make sure that it is firmly attached to the stairs. Fix any carpet that is loose or worn. Avoid having throw rugs at the top or bottom of the stairs. If you do have throw rugs, attach them to the floor with carpet tape. Make sure that you have a light switch at the top of the stairs and the bottom of the stairs. If you do not have them, ask someone to add them for you. What else can I do to help prevent falls? Wear shoes that: Do not have high heels. Have rubber bottoms. Are comfortable and fit you well. Are closed at the toe. Do not wear sandals. If you use a stepladder: Make sure that it is fully opened.  Do not climb a closed stepladder. Make sure that both sides of the stepladder are locked into place. Ask someone to hold it for you, if possible. Clearly mark and make sure that you can see: Any grab bars or handrails. First and last steps. Where the edge of each step is. Use tools that help you move around (mobility aids) if they are needed. These include: Canes. Walkers. Scooters. Crutches. Turn on the lights when you go into a dark area. Replace any light bulbs as soon as they burn out. Set up your furniture so you have a clear path. Avoid moving your furniture around. If any of your floors are uneven, fix them. If there are any pets around you, be aware of where they are. Review your medicines with your doctor. Some medicines can make you feel dizzy. This can increase your chance of falling. Ask your doctor what other things that you can do to help prevent falls. This information is not intended to replace advice given to you by your health care provider. Make sure you discuss any questions you have with your health care provider. Document Released: 07/05/2009 Document Revised: 02/14/2016 Document Reviewed: 10/13/2014 Elsevier Interactive Patient Education  2017 [employer].

## 2023-03-10 NOTE — Assessment & Plan Note (Signed)
Checking B12 level and adjust as needed. Taking oral otc daily.

## 2023-03-10 NOTE — Assessment & Plan Note (Signed)
Up to date on mammogram.  

## 2023-03-10 NOTE — Assessment & Plan Note (Signed)
Checking Hga1c and adjust as needed.  

## 2023-03-10 NOTE — Assessment & Plan Note (Signed)
Encouraged to quit. She agreed to referral to lung cancer screening which health coach placed today. She wants to cut back but struggling with this.

## 2023-03-10 NOTE — Assessment & Plan Note (Addendum)
Due for DEXA and this was ordered by health coach encouraged her to schedule this and get done. Checking CMP and vitamin D in case treatment needed.

## 2023-03-10 NOTE — Progress Notes (Signed)
   Subjective:   Patient ID: Stacey [name], female    DOB: 24-Mar-1956, 67 y.o.   MRN: [id number]  HPI The patient is here for physical.  PMH, [facility], social history reviewed and updated  Review of Systems  Constitutional: Negative.   HENT: Negative.    Eyes: Negative.   Respiratory:  Negative for cough, chest tightness and shortness of breath.   Cardiovascular:  Negative for chest pain, palpitations and leg swelling.  Gastrointestinal:  Negative for abdominal distention, abdominal pain, constipation, diarrhea, nausea and vomiting.  Musculoskeletal:  Positive for arthralgias.  Skin: Negative.   Neurological: Negative.   Psychiatric/Behavioral: Negative.      Objective:  Physical Exam Constitutional:      Appearance: She is well-developed.  HENT:     Head: Normocephalic and atraumatic.  Cardiovascular:     Rate and Rhythm: Normal rate and regular rhythm.  Pulmonary:     Effort: Pulmonary effort is normal. No respiratory distress.     Breath sounds: Normal breath sounds. No wheezing or rales.  Abdominal:     General: Bowel sounds are normal. There is no distension.     Palpations: Abdomen is soft.     Tenderness: There is no abdominal tenderness. There is no rebound.  Musculoskeletal:        General: Tenderness present.     Cervical back: Normal range of motion.  Skin:    General: Skin is warm and dry.  Neurological:     Mental Status: She is alert and oriented to person, place, and time.     Coordination: Coordination normal.     Vitals:   03/10/23 0918  BP: 120/72  Pulse: (!) 101  Temp: 98.7 F (37.1 C)  TempSrc: Oral  SpO2: 93%  Weight: 203 lb (92.1 kg)  Height: 5\' 8"  (1.727 m)    Assessment & Plan:

## 2023-03-10 NOTE — Progress Notes (Signed)
Bone Density Scan: ordered 03/10/2023 with diagnosis of osteoporosis M81.0  Stacey Madarang N. Onaje Warne, LPN. [facility] AWV Team Direct Dial: [phone]

## 2023-03-10 NOTE — Assessment & Plan Note (Signed)
Checking vitamin D level and adjust as needed.  

## 2023-03-10 NOTE — Patient Instructions (Signed)
We will do the bone density and lung cancer screening. Work on quitting smoking.

## 2023-03-10 NOTE — Assessment & Plan Note (Signed)
Flu shot yearly. Pneumonia complete. Shingrix complete. Tetanus due at pharmacy. Colonoscopy due 2027. Mammogram due 2025, pap smear aged out and dexa due ordered. Counseled about sun safety and mole surveillance. Counseled about the dangers of distracted driving. Given 10 year screening recommendations.

## 2023-03-10 NOTE — Progress Notes (Signed)
Subjective:   Stacey [name] is a 67 y.o. female who presents for Medicare Annual (Subsequent) preventive examination.  Review of Systems     Cardiac Risk Factors include: advanced age (>68men, >23 women);family history of premature cardiovascular disease;obesity (BMI >30kg/m2);smoking/ tobacco exposure;sedentary lifestyle     Objective:    Today's Vitals   03/10/23 0837  BP: 120/72  Pulse: (!) 101  Temp: 98.7 F (37.1 C)  TempSrc: Temporal  SpO2: 93%  Weight: 203 lb 9.6 oz (92.4 kg)  Height: 5\' 8"  (1.727 m)  PainSc: 0-No pain   Body mass index is 30.96 kg/m.     03/10/2023    9:16 AM  Advanced Directives  Does Patient Have a Medical Advance Directive? Yes  Type of Estate agent of [place];Living will  Copy of Healthcare Power of Attorney in Chart? No - copy requested    Current Medications (verified) Outpatient Encounter Medications as of 03/10/2023  Medication Sig   Cholecalciferol (VITAMIN D-3) 1000 UNITS CAPS Take 250 capsules by mouth daily.   diazepam (VALIUM) 5 MG tablet Take 1 tablet (5 mg total) by mouth daily as needed for anxiety.   Multiple Vitamins-Minerals (CENTRUM SILVER) tablet Take 1 tablet by mouth daily.   vitamin B-12 (CYANOCOBALAMIN) 1000 MCG tablet Take 1,000 mcg by mouth daily.   vitamin C (ASCORBIC ACID) 500 MG tablet Take 1,000 mg by mouth daily.   aspirin 81 MG tablet Take 81 mg by mouth daily.   Calcium-Magnesium-Vitamin D 600-40-500 MG-MG-UNIT TB24 Take by mouth daily.   clobetasol cream (TEMOVATE) 0.05 %    No facility-administered encounter medications on file as of 03/10/2023.    Allergies (verified) Amoxicillin and Penicillins   History: Past Medical History:  Diagnosis Date   Breast cancer (HCC) 2008   L side, s/p lumpectomy, chemotherapy and radiation   Past Surgical History:  Procedure Laterality Date   BREAST LUMPECTOMY  2008   Family History  Problem Relation Age of Onset   Diabetes Other     Diabetes Mother    Heart disease Father    Breast cancer Sister    Heart disease Brother    Breast cancer Paternal Aunt    Colon cancer Paternal Aunt    Heart disease Maternal Grandfather    Celiac disease Paternal Grandmother    Heart disease Paternal Grandfather    Social History   Socioeconomic History   Marital status: Married    Spouse name: Not on file   Number of children: Not on file   Years of education: Not on file   Highest education level: Not on file  Occupational History   Not on file  Tobacco Use   Smoking status: Every Day    Packs/day: 1.50    Years: 45.00    Additional pack years: 0.00    Total pack years: 67.50    Types: Cigarettes   Smokeless tobacco: Never  Substance and Sexual Activity   Alcohol use: Yes    Alcohol/week: 0.0 standard drinks of alcohol   Drug use: No   Sexual activity: Not on file  Other Topics Concern   Not on file  Social History Narrative   Not on file   Social Determinants of Health   Financial Resource Strain: Low Risk  (03/10/2023)   Overall Financial Resource Strain (CARDIA)    Difficulty of Paying Living Expenses: Not hard at all  Food Insecurity: No Food Insecurity (03/10/2023)   Hunger Vital Sign    Worried About  Running Out of Food in the Last Year: Never true    Ran Out of Food in the Last Year: Never true  Transportation Needs: No Transportation Needs (03/10/2023)   PRAPARE - Administrator, Civil Service (Medical): No    Lack of Transportation (Non-Medical): No  Physical Activity: Inactive (03/10/2023)   Exercise Vital Sign    Days of Exercise per Week: 0 days    Minutes of Exercise per Session: 0 min  Stress: No Stress Concern Present (03/10/2023)   [employer] of Occupational Health - Occupational Stress Questionnaire    Feeling of Stress : Not at all  Social Connections: Unknown (03/10/2023)   Social Connection and Isolation Panel [NHANES]    Frequency of Communication with Friends and  Family: More than three times a week    Frequency of Social Gatherings with Friends and Family: More than three times a week    Attends Religious Services: Patient unable to answer    Active Member of Clubs or Organizations: Yes    Attends Engineer, structural: More than 4 times per year    Marital Status: Married    Tobacco Counseling Ready to quit: Not Answered Counseling given: Not Answered   Clinical Intake:  Pre-visit preparation completed: Yes  Pain : No/denies pain Pain Score: 0-No pain     BMI - recorded: 30.96 Nutritional Status: BMI > 30  Obese Nutritional Risks: None Diabetes: No  How often do you need to have someone help you when you read instructions, pamphlets, or other written materials from your doctor or pharmacy?: 1 - Never What is the last grade level you completed in school?: HSG  Interpreter Needed?: No  Information entered by :: Jamarques Pinedo N. Rechelle Niebla, LPN.   Activities of Daily Living    03/10/2023    9:17 AM  In your present state of health, do you have any difficulty performing the following activities:  Hearing? 1  Vision? 0  Difficulty concentrating or making decisions? 0  Walking or climbing stairs? 0  Dressing or bathing? 0  Doing errands, shopping? 0  Preparing Food and eating ? N  Using the Toilet? N  In the past six months, have you accidently leaked urine? Y  Do you have problems with loss of bowel control? N  Managing your Medications? N  Managing your Finances? N  Housekeeping or managing your Housekeeping? N    Patient Care Team: Myrlene Broker, MD as PCP - General (Internal Medicine) Rachael Fee, MD (Gastroenterology) Jeanie Sewer, Eli Phillips., MD (Hematology and Oncology)  Indicate any recent Medical Services you may have received from other than [facility] providers in the past year (date may be approximate).     Assessment:   This is a routine wellness examination for Stacey [name].  Hearing/Vision  screen Hearing Screening - Comments:: Patient has decreased hearing in right ear; no hearing aids.  Needs referral for hearing evaluation. Vision Screening - Comments:: Wears otc readers - no eye doctor.   Dietary issues and exercise activities discussed:     Goals Addressed             This Visit's Progress    My goal is to quit smoking and lose weight.        Depression Screen    03/10/2023    9:17 AM 03/10/2023    9:15 AM 03/07/2022    8:15 AM 08/27/2021    3:25 PM 06/22/2020    2:40 PM 07/21/2018   10:47  AM 07/16/2017    8:03 AM  PHQ 2/9 Scores  PHQ - 2 Score 0 0 0 0 6 0 0  PHQ- 9 Score 0 0         Fall Risk    03/10/2023    9:17 AM 03/10/2023    9:16 AM 03/07/2022    8:15 AM 08/27/2021    3:25 PM 06/22/2020    2:42 PM  Fall Risk   Falls in the past year? 0 0 0 0 0  Number falls in past yr: 0 0  0 0  Injury with Fall? 0 0  0 0  Risk for fall due to : No Fall Risks      Follow up Falls prevention discussed Falls evaluation completed       MEDICARE RISK AT HOME:  Medicare Risk at Home - 03/10/23 0918     Any stairs in or around the home? Yes    If so, are there any without handrails? No    Home free of loose throw rugs in walkways, pet beds, electrical cords, etc? Yes    Adequate lighting in your home to reduce risk of falls? Yes    Life alert? No    Use of a cane, walker or w/c? No    Grab bars in the bathroom? Yes    Shower chair or bench in shower? No    Elevated toilet seat or a handicapped toilet? No             TIMED UP AND GO:  Was the test performed?  Yes  Length of time to ambulate 10 feet: 8 sec Gait steady and fast without use of assistive device    Cognitive Function:        03/10/2023    9:18 AM  6CIT Screen  What Year? 0 points  What month? 0 points  What time? 0 points  Count back from 20 0 points  Months in reverse 0 points  Repeat phrase 0 points  Total Score 0 points    Immunizations Immunization History  Administered  Date(s) Administered   Fluad Quad(high Dose 65+) 08/27/2021   Influenza Split 07/21/2018   Influenza,inj,Quad PF,6+ Mos 07/21/2018   PFIZER(Purple Top)SARS-COV-2 Vaccination 12/15/2019, 01/05/2020   Td 09/22/2008   Tdap 09/22/2008    TDAP status: Due, Education has been provided regarding the importance of this vaccine. Advised may receive this vaccine at local pharmacy or Health Dept. Aware to provide a copy of the vaccination record if obtained from local pharmacy or Health Dept. Verbalized acceptance and understanding.  Flu Vaccine status: Due, Education has been provided regarding the importance of this vaccine. Advised may receive this vaccine at local pharmacy or Health Dept. Aware to provide a copy of the vaccination record if obtained from local pharmacy or Health Dept. Verbalized acceptance and understanding.  Pneumococcal vaccine status: Due, Education has been provided regarding the importance of this vaccine. Advised may receive this vaccine at local pharmacy or Health Dept. Aware to provide a copy of the vaccination record if obtained from local pharmacy or Health Dept. Verbalized acceptance and understanding.  Covid-19 vaccine status: Completed vaccines  Qualifies for Shingles Vaccine? Yes   Zostavax completed No   Shingrix Completed?: No.    Education has been provided regarding the importance of this vaccine. Patient has been advised to call insurance company to determine out of pocket expense if they have not yet received this vaccine. Advised may also receive vaccine at local  pharmacy or Health Dept. Verbalized acceptance and understanding.  Screening Tests Health Maintenance  Topic Date Due   Lung Cancer Screening  Never done   DTaP/Tdap/Td (3 - Td or Tdap) 09/22/2018   COVID-19 Vaccine (3 - 2023-24 season) 03/26/2023 (Originally 05/23/2022)   INFLUENZA VACCINE  04/23/2023   Medicare Annual Wellness (AWV)  03/09/2024   MAMMOGRAM  02/24/2025   Colonoscopy  04/07/2026    DEXA SCAN  Completed   Hepatitis C Screening  Completed   HPV VACCINES  Aged Out   Pneumonia Vaccine 71+ Years old  Discontinued   Zoster Vaccines- Shingrix  Discontinued    Health Maintenance  Health Maintenance Due  Topic Date Due   Lung Cancer Screening  Never done   DTaP/Tdap/Td (3 - Td or Tdap) 09/22/2018    Colorectal cancer screening: Type of screening: Colonoscopy. Completed 04/08/2019. Repeat every 7 years  Mammogram status: Completed 02/25/2023. Repeat every year  Bone Density status: Ordered 03/10/2023. Pt provided with contact info and advised to call to schedule appt.  Lung Cancer Screening: (Low Dose CT Chest recommended if Age 84-80 years, 20 pack-year currently smoking OR have quit w/in 15years.) does qualify.   Lung Cancer Screening Referral: Yes  Additional Screening:  Hepatitis C Screening: does qualify; Completed 01/15/2016  Vision Screening: Recommended annual ophthalmology exams for early detection of glaucoma and other disorders of the eye. Is the patient up to date with their annual eye exam?  No  Who is the provider or what is the name of the office in which the patient attends annual eye exams? None If pt is not established with a provider, would they like to be referred to a provider to establish care? Yes .   Dental Screening: Recommended annual dental exams for proper oral hygiene  Community Resource Referral / Chronic Care Management: CRR required this visit?  Yes   CCM required this visit?  No     Plan:     I have personally reviewed and noted the following in the patient's chart:   Medical and social history Use of alcohol, tobacco or illicit drugs  Current medications and supplements including opioid prescriptions. Patient is not currently taking opioid prescriptions. Functional ability and status Nutritional status Physical activity Advanced directives List of other physicians Hospitalizations, surgeries, and ER visits in previous  12 months Vitals Screenings to include cognitive, depression, and falls Referrals and appointments  In addition, I have reviewed and discussed with patient certain preventive protocols, quality metrics, and best practice recommendations. A written personalized care plan for preventive services as well as general preventive health recommendations were provided to patient.     Mickeal Needy, LPN   1/61/0960   After Visit Summary: Printed and given to patient.  Nurse Notes: Normal cognitive status assessed by direct observation by this Nurse Health Advisor. No abnormalities found.

## 2023-03-11 ENCOUNTER — Ambulatory Visit (INDEPENDENT_AMBULATORY_CARE_PROVIDER_SITE_OTHER)
Admission: RE | Admit: 2023-03-11 | Discharge: 2023-03-11 | Disposition: A | Discharge status: Home / Self Care | Payer: Medicare Other | Source: Ambulatory Visit | Attending: Internal Medicine | Admitting: Internal Medicine

## 2023-03-11 DIAGNOSIS — Z13820 Encounter for screening for osteoporosis: Secondary | ICD-10-CM | POA: Diagnosis not present

## 2023-03-12 ENCOUNTER — Encounter: Payer: Self-pay | Admitting: Internal Medicine

## 2023-03-12 LAB — HM DEXA SCAN: HM Dexa Scan: -2.2

## 2023-03-14 ENCOUNTER — Encounter: Payer: Self-pay | Admitting: Internal Medicine

## 2023-06-17 ENCOUNTER — Telehealth: Payer: Self-pay | Admitting: *Deleted

## 2023-06-17 NOTE — Telephone Encounter (Signed)
Pls call PT to set up LCS. Her # is [phone]

## 2023-06-18 ENCOUNTER — Other Ambulatory Visit: Payer: Self-pay

## 2023-06-18 DIAGNOSIS — Z122 Encounter for screening for malignant neoplasm of respiratory organs: Secondary | ICD-10-CM

## 2023-06-18 DIAGNOSIS — F17210 Nicotine dependence, cigarettes, uncomplicated: Secondary | ICD-10-CM

## 2023-06-18 DIAGNOSIS — Z87891 Personal history of nicotine dependence: Secondary | ICD-10-CM

## 2023-06-18 NOTE — Telephone Encounter (Signed)
Patient has been scheduled

## 2023-06-25 ENCOUNTER — Ambulatory Visit: Payer: Medicare Other | Admitting: Physician Assistant

## 2023-06-25 ENCOUNTER — Encounter: Payer: Self-pay | Admitting: Physician Assistant

## 2023-06-25 DIAGNOSIS — F17210 Nicotine dependence, cigarettes, uncomplicated: Secondary | ICD-10-CM | POA: Diagnosis not present

## 2023-06-25 NOTE — Patient Instructions (Signed)

## 2023-06-25 NOTE — Progress Notes (Signed)
Virtual Visit via Telephone Note  I connected with Mahealani Sulak on 06/25/23 at  9:30 AM by telephone and verified that I am speaking with the correct person using two identifiers.  Location: Patient: home Provider: working virtually from home   I discussed the limitations, risks, security and privacy concerns of performing an evaluation and management service by telephone and the availability of in person appointments. I also discussed with the patient that there may be a patient responsible charge related to this service. The patient expressed understanding and agreed to proceed.     Shared Decision Making Visit Lung Cancer Screening Program [phone])   Eligibility: Age 67 Pack Years Smoking History Calculation 14 (# packs/per year x # years smoked) Recent History of coughing up blood  No Unexplained weight loss? No ( >Than 15 pounds within the last 6 months ) Prior History Lung / other cancer No (Diagnosis within the last 5 years already requiring surveillance chest CT Scans). Smoking Status Current Smoker  Visit Components: Discussion included one or more decision making aids. Yes Discussion included risk/benefits of screening. Yes Discussion included potential follow up diagnostic testing for abnormal scans. Yes Discussion included meaning and risk of over diagnosis. Yes Discussion included meaning and risk of False Positives. Yes Discussion included meaning of total radiation exposure. Yes  Counseling Included: Importance of adherence to annual lung cancer LDCT screening. Yes Impact of comorbidities on ability to participate in the program. Yes Ability and willingness to under diagnostic treatment: Yes  Smoking Cessation Counseling: Current Smokers:  Discussed importance of smoking cessation. Yes Information about tobacco cessation classes and interventions provided to patient. Yes Symptomatic Patient. No Diagnosis Code: Tobacco Use Z72.0 Asymptomatic Patient  Yes  Counseling (Intermediate counseling: > three minutes counseling) J1914 Information about tobacco cessation classes and interventions provided to patient. Yes Written Order for Lung Cancer Screening with LDCT placed in Epic. Yes (CT Chest Lung Cancer Screening Low Dose W/O CM) [id number] Z12.2-Screening of respiratory organs Z87.891-Personal history of nicotine dependence   I have spent 25 minutes of face to face/ virtual visit  time with the patient discussing the risks and benefits of lung cancer screening. We discussed the above noted topics. We paused at intervals to allow for questions to be asked and answered to ensure understanding.We discussed that the single most powerful action that anyone can take to decrease their risk of developing lung cancer is to quit smoking.  We discussed options for tools to aid in quitting smoking including nicotine replacement therapy, non-nicotine medications, support groups, Quit Smart classes, and behavior modification. We discussed that often times setting smaller, more achievable goals, such as eliminating 1 cigarette a day for a week and then 2 cigarettes a day for a week can be helpful in slowly decreasing the number of cigarettes smoked. I provided  them  with smoking cessation  information  with contact information for community resources, classes, free nicotine replacement therapy, and access to mobile apps, text messaging, and on-line smoking cessation help. I have also provided  them  the office contact information in the event they have any questions. We discussed the time and location of the scan, and that either Abigail Miyamoto RN, Karlton Lemon, RN  or I will call / send a letter with the results within 24-72 hours of receiving them. The patient verbalized understanding of all of  the above and had no further questions upon leaving the office. They have my contact information in the event they have any further  questions.  I spent 2 minutes counseling  on smoking cessation and the health risks of continued tobacco abuse.  I explained to the patient that there has been a high incidence of coronary artery disease noted on these exams. I explained that this is a non-gated exam therefore degree or severity cannot be determined. This patient is not on statin therapy. I have asked the patient to follow-up with their PCP regarding any incidental finding of coronary artery disease and management with diet or medication as their PCP  feels is clinically indicated. The patient verbalized understanding of the above and had no further questions upon completion of the visit.    Darcella Gasman Araseli Sherry, PA-C

## 2023-06-26 ENCOUNTER — Ambulatory Visit (HOSPITAL_COMMUNITY)
Admission: RE | Admit: 2023-06-26 | Discharge: 2023-06-26 | Disposition: A | Discharge status: Home / Self Care | Payer: Medicare Other | Source: Ambulatory Visit | Attending: Internal Medicine | Admitting: Internal Medicine

## 2023-06-26 DIAGNOSIS — F17210 Nicotine dependence, cigarettes, uncomplicated: Secondary | ICD-10-CM | POA: Insufficient documentation

## 2023-06-26 DIAGNOSIS — Z87891 Personal history of nicotine dependence: Secondary | ICD-10-CM | POA: Insufficient documentation

## 2023-06-26 DIAGNOSIS — Z122 Encounter for screening for malignant neoplasm of respiratory organs: Secondary | ICD-10-CM | POA: Diagnosis not present

## 2023-07-09 ENCOUNTER — Telehealth: Payer: Self-pay | Admitting: Acute Care

## 2023-07-10 ENCOUNTER — Other Ambulatory Visit: Payer: Self-pay

## 2023-07-10 DIAGNOSIS — Z122 Encounter for screening for malignant neoplasm of respiratory organs: Secondary | ICD-10-CM

## 2023-07-10 DIAGNOSIS — F17210 Nicotine dependence, cigarettes, uncomplicated: Secondary | ICD-10-CM

## 2023-07-10 DIAGNOSIS — Z87891 Personal history of nicotine dependence: Secondary | ICD-10-CM

## 2023-12-28 ENCOUNTER — Encounter: Payer: Self-pay | Admitting: Family Medicine

## 2023-12-28 ENCOUNTER — Ambulatory Visit (INDEPENDENT_AMBULATORY_CARE_PROVIDER_SITE_OTHER): Admitting: Family Medicine

## 2023-12-28 ENCOUNTER — Ambulatory Visit: Payer: Self-pay

## 2023-12-28 ENCOUNTER — Ambulatory Visit (INDEPENDENT_AMBULATORY_CARE_PROVIDER_SITE_OTHER)

## 2023-12-28 ENCOUNTER — Telehealth: Payer: Self-pay

## 2023-12-28 VITALS — BP 130/82 | HR 100 | Temp 97.8°F | Ht 68.0 in | Wt 219.0 lb

## 2023-12-28 DIAGNOSIS — F17200 Nicotine dependence, unspecified, uncomplicated: Secondary | ICD-10-CM

## 2023-12-28 DIAGNOSIS — D582 Other hemoglobinopathies: Secondary | ICD-10-CM

## 2023-12-28 DIAGNOSIS — R0989 Other specified symptoms and signs involving the circulatory and respiratory systems: Secondary | ICD-10-CM | POA: Diagnosis not present

## 2023-12-28 DIAGNOSIS — R7303 Prediabetes: Secondary | ICD-10-CM

## 2023-12-28 DIAGNOSIS — F5101 Primary insomnia: Secondary | ICD-10-CM

## 2023-12-28 DIAGNOSIS — R5383 Other fatigue: Secondary | ICD-10-CM | POA: Diagnosis not present

## 2023-12-28 DIAGNOSIS — R0683 Snoring: Secondary | ICD-10-CM

## 2023-12-28 DIAGNOSIS — R0602 Shortness of breath: Secondary | ICD-10-CM

## 2023-12-28 DIAGNOSIS — F1090 Alcohol use, unspecified, uncomplicated: Secondary | ICD-10-CM

## 2023-12-28 DIAGNOSIS — I700 Atherosclerosis of aorta: Secondary | ICD-10-CM

## 2023-12-28 DIAGNOSIS — R601 Generalized edema: Secondary | ICD-10-CM | POA: Diagnosis not present

## 2023-12-28 DIAGNOSIS — R7989 Other specified abnormal findings of blood chemistry: Secondary | ICD-10-CM

## 2023-12-28 DIAGNOSIS — R9431 Abnormal electrocardiogram [ECG] [EKG]: Secondary | ICD-10-CM

## 2023-12-28 LAB — COMPREHENSIVE METABOLIC PANEL WITH GFR
ALT: 35 U/L (ref 0–35)
AST: 27 U/L (ref 0–37)
Albumin: 4 g/dL (ref 3.5–5.2)
Alkaline Phosphatase: 75 U/L (ref 39–117)
BUN: 13 mg/dL (ref 6–23)
CO2: 32 meq/L (ref 19–32)
Calcium: 8.9 mg/dL (ref 8.4–10.5)
Chloride: 96 meq/L (ref 96–112)
Creatinine, Ser: 0.73 mg/dL (ref 0.40–1.20)
GFR: 84.18 mL/min (ref >60.00)
Glucose, Bld: 94 mg/dL (ref 70–99)
Potassium: 4.2 meq/L (ref 3.5–5.1)
Sodium: 136 meq/L (ref 135–145)
Total Bilirubin: 1 mg/dL (ref 0.2–1.2)
Total Protein: 6.6 g/dL (ref 6.0–8.3)

## 2023-12-28 LAB — CBC WITH DIFFERENTIAL/PLATELET
Basophils Absolute: 0 10*3/uL (ref 0.0–0.1)
Basophils Relative: 0.4 % (ref 0.0–3.0)
Eosinophils Absolute: 0 10*3/uL (ref 0.0–0.7)
Eosinophils Relative: 0.2 % (ref 0.0–5.0)
HCT: 54.4 % — ABNORMAL HIGH (ref 36.0–46.0)
Hemoglobin: 18 g/dL (ref 12.0–15.0)
Lymphocytes Relative: 11.7 % — ABNORMAL LOW (ref 12.0–46.0)
Lymphs Abs: 0.8 10*3/uL (ref 0.7–4.0)
MCHC: 33.1 g/dL (ref 30.0–36.0)
MCV: 99.6 fl (ref 78.0–100.0)
Monocytes Absolute: 0.7 10*3/uL (ref 0.1–1.0)
Monocytes Relative: 10.1 % (ref 3.0–12.0)
Neutro Abs: 5.4 10*3/uL (ref 1.4–7.7)
Neutrophils Relative %: 77.6 % — ABNORMAL HIGH (ref 43.0–77.0)
Platelets: 179 10*3/uL (ref 150.0–400.0)
RBC: 5.46 Mil/uL — ABNORMAL HIGH (ref 3.87–5.11)
RDW: 14.9 % (ref 11.5–15.5)
WBC: 7 10*3/uL (ref 4.0–10.5)

## 2023-12-28 LAB — LIPID PANEL
Cholesterol: 158 mg/dL (ref 0–200)
HDL: 66.8 mg/dL (ref >39.00)
LDL Cholesterol: 74 mg/dL (ref 0–99)
NonHDL: 91.69
Total CHOL/HDL Ratio: 2
Triglycerides: 86 mg/dL (ref 0.0–149.0)
VLDL: 17.2 mg/dL (ref 0.0–40.0)

## 2023-12-28 LAB — BRAIN NATRIURETIC PEPTIDE: Pro B Natriuretic peptide (BNP): 176 pg/mL — ABNORMAL HIGH (ref 0.0–100.0)

## 2023-12-28 LAB — HEMOGLOBIN A1C: Hgb A1c MFr Bld: 6.5 % (ref 4.6–6.5)

## 2023-12-28 MED ORDER — FUROSEMIDE 20 MG PO TABS: 20.0000 mg | ORAL_TABLET | Freq: Every day | ORAL | 1 refills | Status: DC

## 2023-12-28 NOTE — Telephone Encounter (Signed)
 CRITICAL VALUE STICKER  CRITICAL VALUE: High Hemoglobin at 18  RECEIVER (on-site recipient of call): Stacey [name]  DATE & TIME NOTIFIED: 12/28/23 @5 :56 pm  MESSENGER (representative from lab): Perlie Gold  MD NOTIFIED: Yes, via MyChart  TIME OF NOTIFICATION: 4:57 pm

## 2023-12-28 NOTE — Telephone Encounter (Signed)
 Patient already triaged for this earlier, see that triage encounter.  Copied from CRM [id number]. Topic: Clinical - Red Word Triage >> Dec 28, 2023  9:57 AM [place] A wrote: [facility] that prompted transfer to Nurse Triage: Patient's husband noticed swelling on left ankle and right ankle-left ankle is worst. No pain

## 2023-12-28 NOTE — Telephone Encounter (Signed)
 Chief Complaint: swelling Symptoms: swelling, sob with exertion Frequency: 1-2 weeks Pertinent Negatives: Patient denies chest pain Disposition: [x] ED /[] Urgent Care (no appt availability in office) / [] Appointment(In office/virtual)/ []  [place] [facility]/ [] Home Care/ [] Refused Recommended Disposition /[] [place] [facility]/ [x]  Follow-up with PCP Additional Notes: pt states that for the past week or two she has been having swelling in both feet and ankles and her wrist and she also feels as she has some abd swelling. States that she has also developed SOB with exertion about the same times the swelling started. Denies chest pain. Patient advised ED, She was hesitant about going and wants and appt with Dr. Okey Dupre. Advised that not good to wait until appt as next available was 4/25 and urged ED then f/u with Dr. Okey Dupre. Pt agreed.  Copied from CRM [id number]. Topic: Clinical - Red Word Triage >> Dec 28, 2023  9:17 AM Gurney Maxin H wrote: [facility] that prompted transfer to Nurse Triage: Sugar problem or major water retention issues, swollen all over, legs, ankles, wrist, been pre-diabetic for a few years Reason for Disposition  [1] Difficulty breathing with exertion (e.g., walking) AND [2] new-onset or worsening  Answer Assessment - Initial Assessment Questions 1. ONSET: "When did the swelling start?" (e.g., minutes, hours, days)     About a week or two 2. LOCATION: "What part of the leg is swollen?"  "Are both legs swollen or just one leg?"     Both legs 3. SEVERITY: "How bad is the swelling?" (e.g., localized; mild, moderate, severe)   - Localized: Small area of swelling localized to one leg.   - MILD pedal edema: Swelling limited to foot and ankle, pitting edema < 1/4 inch (6 mm) deep, rest and elevation eliminate most or all swelling.   - MODERATE edema: Swelling of lower leg to knee, pitting edema > 1/4 inch (6 mm) deep, rest and elevation only partially reduce swelling.   - SEVERE  edema: Swelling extends above knee, facial or hand swelling present.      moderate 4. REDNESS: "Does the swelling look red or infected?"     no 5. PAIN: "Is the swelling painful to touch?" If Yes, ask: "How painful is it?"   (Scale 1-10; mild, moderate or severe)     no 6. FEVER: "Do you have a fever?" If Yes, ask: "What is it, how was it measured, and when did it start?"      no 7. CAUSE: "What do you think is causing the leg swelling?"     Possible blood sugar 8. MEDICAL HISTORY: "Do you have a history of blood clots (e.g., DVT), cancer, heart failure, kidney disease, or liver failure?"     no 9. RECURRENT SYMPTOM: "Have you had leg swelling before?" If Yes, ask: "When was the last time?" "What happened that time?"     no 10. OTHER SYMPTOMS: "Do you have any other symptoms?" (e.g., chest pain, difficulty breathing)       Swelling in wrist, and sob  Protocols used: Leg Swelling and Edema-A-AH

## 2023-12-28 NOTE — Patient Instructions (Addendum)
 EKG shows no acute ischemia.  I have sent in lasix 20mg  for you to take once daily to help treat your swelling.  We are getting an xray today. We will be in contact with any abnormal results that require further attention.  We are checking labs today, will be in contact with any results that require further attention  I have sent a referral to pulmonology.  Similar reaching out to get you scheduled.  Will also send a referral to cardiology.  So we will be reaching out to get you scheduled.  Follow-up as scheduled with PCP

## 2023-12-28 NOTE — Progress Notes (Signed)
 Acute Office Visit  Subjective:     Patient ID: Stacey [name], female    DOB: [birth year], 68 y.o.   MRN: [id number]  Chief Complaint  Patient presents with   Shortness of Breath    Going on for the last couple weeks, also notes swelling bilateral legs. Left leg is worse  Not sleeping at night, thinks she might have sleep apnea. Gasping for a breath sometimes in sleep, doesn't feel tired at night.    HPI Patient is in today for evaluation of SOB, increased swelling to both legs, wrists, abdomen. Accompanied by her husband who is supplementing her history. Husband suspects CHF. States that she took a dose of her husband's lasix (40mg ) with little relief.  States that since retiring, she has increased the amount of cigarette smoking and alcohol intake to about 1.5 PPD and 3-4 beers daily. States that she has not been sleeping at night at all, says she can never get comfortable enough to fall asleep. Husband concurs.  States that he sees her sleeping and nodding off while she is sitting up during the day Has never seen pulmonology, has never been evaluated for OSA, has never seen cardiology. Did have lung cancer screening CT in October 2024, no concerning pulmonary nodules noted. Emphysema noted on that CT, aortic atherosclerosis noted on CT as well. Reports that at home oxygen levels rarely reach 90%, states that she is short of breath all the time and every little thing she does wears her out. Denies other concerns today.    ROS Per HPI      Objective:    BP 130/82 (BP Location: Left Arm, Patient Position: Sitting)   Pulse 100   Temp 97.8 F (36.6 C) (Temporal)   Ht 5\' 8"  (1.727 m)   Wt 219 lb (99.3 kg)   SpO2 90%   BMI 33.30 kg/m    Physical Exam Vitals and nursing note reviewed.  Constitutional:      General: She is not in acute distress.    Appearance: Normal appearance. She is obese.  HENT:     Head: Normocephalic and atraumatic.     Right Ear: External  ear normal.     Left Ear: External ear normal.  Eyes:     Extraocular Movements: Extraocular movements intact.     Pupils: Pupils are equal, round, and reactive to light.  Cardiovascular:     Rate and Rhythm: Normal rate and regular rhythm.     Pulses: Normal pulses.     Heart sounds: Normal heart sounds. No murmur heard. Pulmonary:     Effort: Pulmonary effort is normal. No respiratory distress.     Breath sounds: Examination of the right-lower field reveals decreased breath sounds. Examination of the left-lower field reveals decreased breath sounds. Decreased breath sounds present. No wheezing, rhonchi or rales.     Comments: Able to speak in complete sentences, had to sit for 2 minutes before oxygen would rise to 90% on room air Abdominal:     General: Bowel sounds are normal. There is no distension.     Palpations: Abdomen is soft. There is no mass.     Tenderness: There is no abdominal tenderness. There is no guarding or rebound.     Hernia: No hernia is present.  Musculoskeletal:        General: Swelling present.     Cervical back: Normal range of motion.     Right lower leg: Edema (+2 pitting) present.  Left lower leg: Edema (+2 pitting) present.  Lymphadenopathy:     Cervical: No cervical adenopathy.  Neurological:     General: No focal deficit present.     Mental Status: She is alert and oriented to person, place, and time.  Psychiatric:        Mood and Affect: Mood normal.        Thought Content: Thought content normal.   EKG: Indication: SOBOE Rate: 101 Interpretation: Sinus tach, nonspecific T wave abnormality Changes from previous: yes   No results found for any visits on 12/28/23.      Assessment & Plan:   SOBOE (shortness of breath on exertion) -     CBC with Differential/Platelet -     Comprehensive metabolic panel with GFR -     Brain natriuretic peptide -     D-dimer, quantitative -     Pulmonary Visit -     EKG 12-Lead -     DG Chest 2 View;  Future -     Ambulatory referral to Cardiology  Generalized edema -     CBC with Differential/Platelet -     Comprehensive metabolic panel with GFR -     Brain natriuretic peptide -     D-dimer, quantitative -     Furosemide; Take 1 tablet (20 mg total) by mouth daily.  Dispense: 90 tablet; Refill: 1 -     Ambulatory referral to Cardiology  Snoring -     Pulmonary Visit  Primary insomnia -     Pulmonary Visit  Prediabetes -     Hemoglobin A1c -     Lipid panel  Other fatigue -     CBC with Differential/Platelet -     Comprehensive metabolic panel with GFR -     Brain natriuretic peptide -     D-dimer, quantitative -     Pulmonary Visit -     EKG 12-Lead  Aortic atherosclerosis (HCC) -     Lipid panel -     EKG 12-Lead -     Ambulatory referral to Cardiology  Current smoker -     Pulmonary Visit -     EKG 12-Lead -     DG Chest 2 View; Future -     Ambulatory referral to Cardiology  Abnormal EKG -     Ambulatory referral to Cardiology  Alcohol use disorder -     CBC with Differential/Platelet -     Comprehensive metabolic panel with GFR -     Brain natriuretic peptide -     D-dimer, quantitative -     Hemoglobin A1c -     Ambulatory referral to Cardiology  Follow up with specialists as scheduled Discussed decreasing smoking and alcohol intake, increasing activity level Discussed how lifestyle changes could improve overall health I spent greater than 40 minutes on this patient encounter including counseling, diagnosis, treatment options, documentation, face-to-face time.    Meds ordered this encounter  Medications   furosemide (LASIX) 20 MG tablet    Sig: Take 1 tablet (20 mg total) by mouth daily.    Dispense:  90 tablet    Refill:  1    Return for as scheduled.  Sherald Barge, FNP

## 2023-12-28 NOTE — Telephone Encounter (Signed)
 Patient's husband called, permission from patient to speak to him. He says since she got off the phone with the other nurse who mentioned her going to the ED, she's been crying. He says he knows about CHF and he doesn't think the ED is what is needed. He says they have been dealing with this in the past and have come in to see Dr. Okey Dupre. He says even if she can't see Dr. Okey Dupre, if anyone could see her that would be helpful, because going to the ED sitting for hours and then they said see your doctor will be a waste of our time. I asked him to hold while I call the office. Called [facility] and spoke to [facility] who says she can scheduled the patient with Moshe Cipro today at 1440. I advised the husband of the appointment, he agreed. Advised him to go to the ED if her symptoms worsen between now and the appointment. He says she's been dealing with this and it's no worse than when it started, so he will have her there at the office for the appointment.

## 2023-12-29 ENCOUNTER — Ambulatory Visit
Admission: RE | Admit: 2023-12-29 | Discharge: 2023-12-29 | Disposition: A | Discharge status: Home / Self Care | Source: Ambulatory Visit | Attending: Family Medicine

## 2023-12-29 DIAGNOSIS — R601 Generalized edema: Secondary | ICD-10-CM

## 2023-12-29 DIAGNOSIS — R7989 Other specified abnormal findings of blood chemistry: Secondary | ICD-10-CM

## 2023-12-29 DIAGNOSIS — R0602 Shortness of breath: Secondary | ICD-10-CM

## 2023-12-29 DIAGNOSIS — I700 Atherosclerosis of aorta: Secondary | ICD-10-CM

## 2023-12-29 DIAGNOSIS — F17200 Nicotine dependence, unspecified, uncomplicated: Secondary | ICD-10-CM

## 2023-12-29 LAB — D-DIMER, QUANTITATIVE: D-Dimer, Quant: 1.54 ug{FEU}/mL — ABNORMAL HIGH (ref <0.50)

## 2023-12-29 MED ORDER — IOPAMIDOL (ISOVUE-370) INJECTION 76%
75.0000 mL | Freq: Once | INTRAVENOUS | Status: AC | PRN
  Administered 2023-12-29: 75 mL via INTRAVENOUS

## 2023-12-29 NOTE — Addendum Note (Signed)
 Addended by: Sherald Barge on: 12/29/2023 08:26 AM   Modules accepted: Orders

## 2023-12-29 NOTE — Telephone Encounter (Signed)
 Addressed by ordering provider as part of lab results

## 2023-12-30 ENCOUNTER — Encounter: Payer: Self-pay | Admitting: Family Medicine

## 2024-01-01 NOTE — Telephone Encounter (Signed)
 Spoke with patient, answered her questions. She is still debating on cardiology referral.

## 2024-01-04 ENCOUNTER — Encounter: Payer: Self-pay | Admitting: Family Medicine

## 2024-01-04 ENCOUNTER — Ambulatory Visit (INDEPENDENT_AMBULATORY_CARE_PROVIDER_SITE_OTHER): Admitting: Family Medicine

## 2024-01-04 ENCOUNTER — Ambulatory Visit: Payer: Self-pay

## 2024-01-04 VITALS — BP 120/70 | HR 82 | Temp 98.5°F | Ht 68.0 in | Wt 216.0 lb

## 2024-01-04 DIAGNOSIS — M7989 Other specified soft tissue disorders: Secondary | ICD-10-CM

## 2024-01-04 DIAGNOSIS — I700 Atherosclerosis of aorta: Secondary | ICD-10-CM | POA: Diagnosis not present

## 2024-01-04 DIAGNOSIS — R0602 Shortness of breath: Secondary | ICD-10-CM | POA: Diagnosis not present

## 2024-01-04 DIAGNOSIS — Z720 Tobacco use: Secondary | ICD-10-CM | POA: Diagnosis not present

## 2024-01-04 DIAGNOSIS — J432 Centrilobular emphysema: Secondary | ICD-10-CM | POA: Diagnosis not present

## 2024-01-04 DIAGNOSIS — F410 Panic disorder [episodic paroxysmal anxiety] without agoraphobia: Secondary | ICD-10-CM

## 2024-01-04 DIAGNOSIS — R601 Generalized edema: Secondary | ICD-10-CM

## 2024-01-04 DIAGNOSIS — J439 Emphysema, unspecified: Secondary | ICD-10-CM | POA: Insufficient documentation

## 2024-01-04 LAB — CBC WITH DIFFERENTIAL/PLATELET
Basophils Absolute: 0 10*3/uL (ref 0.0–0.1)
Basophils Relative: 0.5 % (ref 0.0–3.0)
Eosinophils Absolute: 0 10*3/uL (ref 0.0–0.7)
Eosinophils Relative: 0.5 % (ref 0.0–5.0)
HCT: 53.2 % — ABNORMAL HIGH (ref 36.0–46.0)
Hemoglobin: 17.5 g/dL — ABNORMAL HIGH (ref 12.0–15.0)
Lymphocytes Relative: 15.7 % (ref 12.0–46.0)
Lymphs Abs: 1.2 10*3/uL (ref 0.7–4.0)
MCHC: 32.9 g/dL (ref 30.0–36.0)
MCV: 99.4 fl (ref 78.0–100.0)
Monocytes Absolute: 1 10*3/uL (ref 0.1–1.0)
Monocytes Relative: 13.2 % — ABNORMAL HIGH (ref 3.0–12.0)
Neutro Abs: 5.2 10*3/uL (ref 1.4–7.7)
Neutrophils Relative %: 70.1 % (ref 43.0–77.0)
Platelets: 183 10*3/uL (ref 150.0–400.0)
RBC: 5.35 Mil/uL — ABNORMAL HIGH (ref 3.87–5.11)
RDW: 14.5 % (ref 11.5–15.5)
WBC: 7.4 10*3/uL (ref 4.0–10.5)

## 2024-01-04 LAB — COMPREHENSIVE METABOLIC PANEL WITH GFR
ALT: 28 U/L (ref 0–35)
AST: 24 U/L (ref 0–37)
Albumin: 3.7 g/dL (ref 3.5–5.2)
Alkaline Phosphatase: 65 U/L (ref 39–117)
BUN: 13 mg/dL (ref 6–23)
CO2: 37 meq/L — ABNORMAL HIGH (ref 19–32)
Calcium: 8.7 mg/dL (ref 8.4–10.5)
Chloride: 94 meq/L — ABNORMAL LOW (ref 96–112)
Creatinine, Ser: 0.78 mg/dL (ref 0.40–1.20)
GFR: 77.74 mL/min (ref >60.00)
Glucose, Bld: 104 mg/dL — ABNORMAL HIGH (ref 70–99)
Potassium: 3.8 meq/L (ref 3.5–5.1)
Sodium: 139 meq/L (ref 135–145)
Total Bilirubin: 0.9 mg/dL (ref 0.2–1.2)
Total Protein: 6.1 g/dL (ref 6.0–8.3)

## 2024-01-04 LAB — BRAIN NATRIURETIC PEPTIDE: Pro B Natriuretic peptide (BNP): 202 pg/mL — ABNORMAL HIGH (ref 0.0–100.0)

## 2024-01-04 MED ORDER — POTASSIUM CHLORIDE ER 20 MEQ PO TBCR: 40.0000 meq | EXTENDED_RELEASE_TABLET | Freq: Every day | ORAL | 1 refills | Status: DC

## 2024-01-04 MED ORDER — FUROSEMIDE 20 MG PO TABS: 40.0000 mg | ORAL_TABLET | Freq: Every day | ORAL | 1 refills | Status: DC

## 2024-01-04 NOTE — Progress Notes (Signed)
 Established Patient Office Visit  Subjective   Patient ID: Stacey [name], female    DOB: [birth year]  Age: 68 y.o. MRN: [id number]  No chief complaint on file.   HPI Patient presents today for medication management. Reports compliance with medication regimen. States that swelling has not really improved to her feet, states that shortness of breath has improved. Reports that she has slept well the last 2 nights. Has not been wearing compression socks. States that she has been having some left finger numbness and tingling for the last couple of years, states that she was told to have it looked at for carpal tunnel, has not followed up with that. Reports great toe numbness to the left foot whenever the foot is significantly swollen, improves when swelling improves. Last visit on 12/28/2023 had positive D-dimer of 1.54, had negative chest CT the same day. At that same visit BNP was 176, hemoglobin 18. She has not been seen by cardiology or pulmonology yet. Medical history as outlined below.  ROS Per HPI    Objective:     BP 120/70 (BP Location: Left Arm, Patient Position: Sitting)   Pulse 82   Temp 98.5 F (36.9 C) (Temporal)   Ht 5\' 8"  (1.727 m)   Wt 216 lb (98 kg)   BMI 32.84 kg/m   Physical Exam Vitals and nursing note reviewed.  Constitutional:      General: She is not in acute distress.    Appearance: Normal appearance. She is obese.  HENT:     Head: Normocephalic and atraumatic.     Comments: Face appears less puffy than last visit on 12/28/23    Right Ear: External ear normal.     Left Ear: External ear normal.     Nose: Nose normal.  Eyes:     Extraocular Movements: Extraocular movements intact.     Pupils: Pupils are equal, round, and reactive to light.  Cardiovascular:     Rate and Rhythm: Normal rate and regular rhythm.     Pulses: Normal pulses.     Heart sounds: Normal heart sounds.  Pulmonary:     Effort: Pulmonary effort is normal. No respiratory  distress.     Breath sounds: Normal breath sounds. No wheezing, rhonchi or rales.  Musculoskeletal:     Cervical back: Normal range of motion.     Right lower leg: Edema (+2 pitting) present.     Left lower leg: Edema (+3 pitting) present.     Comments: LROM r/t swelling to BLE  Lymphadenopathy:     Cervical: No cervical adenopathy.  Skin:    General: Skin is warm and dry.     Capillary Refill: Capillary refill takes 2 to 3 seconds.     Nails: There is clubbing.  Neurological:     General: No focal deficit present.     Mental Status: She is alert and oriented to person, place, and time.  Psychiatric:        Mood and Affect: Mood normal.        Thought Content: Thought content normal.     No results found for any visits on 01/04/24.   The 10-year ASCVD risk score (Arnett DK, et al., 2019) is: 9.9%    Assessment & Plan:   Panic disorder -     CBC with Differential/Platelet -     Comprehensive metabolic panel with GFR  Centrilobular emphysema (HCC) -     CBC with Differential/Platelet -     Comprehensive metabolic  panel with GFR -     Brain natriuretic peptide -     D-dimer, quantitative  Aortic atherosclerosis (HCC) -     Comprehensive metabolic panel with GFR -     Brain natriuretic peptide  Tobacco abuse -     CBC with Differential/Platelet -     Comprehensive metabolic panel with GFR -     Brain natriuretic peptide -     D-dimer, quantitative  Leg swelling -     CBC with Differential/Platelet -     Comprehensive metabolic panel with GFR -     Brain natriuretic peptide -     D-dimer, quantitative -     Furosemide; Take 2 tablets (40 mg total) by mouth daily.  Dispense: 30 tablet; Refill: 1 -     Potassium Chloride ER; Take 2 tablets (40 mEq total) by mouth daily.  Dispense: 60 tablet; Refill: 1  SOB (shortness of breath) -     CBC with Differential/Platelet -     Comprehensive metabolic panel with GFR -     Brain natriuretic peptide -     D-dimer,  quantitative -     Furosemide; Take 2 tablets (40 mg total) by mouth daily.  Dispense: 30 tablet; Refill: 1 -     Potassium Chloride ER; Take 2 tablets (40 mEq total) by mouth daily.  Dispense: 60 tablet; Refill: 1  Generalized edema -     Furosemide; Take 2 tablets (40 mg total) by mouth daily.  Dispense: 30 tablet; Refill: 1  Follow up with specialists as scheduled.  INCREASE lasix to 40mg  once daily START potassium 40mEq daily  I spent 25 minutes on this patient encounter including counseling, diagnosis, treatment options, documentation, face-to-face time.   Return if symptoms worsen or fail to improve.    Wellington Half, FNP

## 2024-01-04 NOTE — Telephone Encounter (Signed)
  Chief Complaint: SOB Symptom: swollen feet   Disposition: [] ED /[] Urgent Care (no appt availability in office) / [x] Appointment(In office/virtual)/ []  [facility]/ [] Home Care/ [] Refused Recommended Disposition /[] [facility]/ []  Follow-up with PCP Additional Notes: Pt's husband jeff called with concerns of  SOB, anxiousness, and swollen feet. Pt was seen on 4/7 and referred to cardiology and pulmonary. Pt hasn't seen those providers yet. "She doesn't need to see a cardiologist. All her labs and tests haven't found anything." RN went over labs and provider notes to help explain the need for referral.  Trey Paula is stated the pt is so worked up and "hysterical that I need help with her until we can see them."  Trey Paula stated pt is "up cleaning the house. The SOB is when she lies on back and side." RN gave care advice on keeping  feet elevated, making sure to drink plenty of water, and to call back if symptoms worsen. Trey Paula verbalized understanding. Pt has appt today at 1440 with NP Judeth Cornfield.         Copied from CRM [id number]. Topic: Clinical - Red Word Triage >> Jan 04, 2024 10:23 AM Ivette P wrote: [facility] that prompted transfer to Nurse Triage: pt is hysterical, having problems recently had a visit with Np and wants a follow Reason for Disposition  [1] MILD difficulty breathing (e.g., minimal/no SOB at rest, SOB with walking, pulse <100) AND [2] NEW-onset or WORSE than normal  Answer Assessment - Initial Assessment Questions 1. RESPIRATORY STATUS: "Describe your breathing?" (e.g., wheezing, shortness of breath, unable to speak, severe coughing)      Lies on back gasping for air  2. ONSET: "When did this breathing problem begin?"      Few months 3. PATTERN "Does the difficult breathing come and go, or has it been constant since it started?"      Comes and goes 4. SEVERITY: "How bad is your breathing?" (e.g., mild, moderate, severe)    - MILD: No SOB at rest, mild SOB with  walking, speaks normally in sentences, can lie down, no retractions, pulse < 100.    - MODERATE: SOB at rest, SOB with minimal exertion and prefers to sit, cannot lie down flat, speaks in phrases, mild retractions, audible wheezing, pulse 100-120.    - SEVERE: Very SOB at rest, speaks in single words, struggling to breathe, sitting hunched forward, retractions, pulse > 120      mild 5. RECURRENT SYMPTOM: "Have you had difficulty breathing before?" If Yes, ask: "When was the last time?" and "What happened that time?"      Yes  6. CARDIAC HISTORY: "Do you have any history of heart disease?" (e.g., heart attack, angina, bypass surgery, angioplasty)      Possible heart failure 7. LUNG HISTORY: "Do you have any history of lung disease?"  (e.g., pulmonary embolus, asthma, emphysema)     emphysema 8. CAUSE: "What do you think is causing the breathing problem?"      Not sure  9. OTHER SYMPTOMS: "Do you have any other symptoms? (e.g., dizziness, runny nose, cough, chest pain, fever)     Panic attack 10. O2 SATURATION MONITOR:  "Do you use an oxygen saturation monitor (pulse oximeter) at home?" If Yes, ask: "What is your reading (oxygen level) today?" "What is your usual oxygen saturation reading?" (e.g., 95%)       na  Protocols used: Breathing Difficulty-A-AH

## 2024-01-04 NOTE — Patient Instructions (Addendum)
 Please follow up with specialists as scheduled.   We are checking labs today, will be in contact with any results that require further attention.   May increase lasix to 40mg  once daily.   Take 40mEq potassium once daily with lasix.   Recommend wearing compression socks.   Follow-up with me for new or worsening symptoms.

## 2024-01-05 LAB — D-DIMER, QUANTITATIVE: D-Dimer, Quant: 1.65 ug{FEU}/mL — ABNORMAL HIGH (ref <0.50)

## 2024-01-11 ENCOUNTER — Encounter: Payer: Self-pay | Admitting: Family Medicine

## 2024-01-11 ENCOUNTER — Ambulatory Visit (INDEPENDENT_AMBULATORY_CARE_PROVIDER_SITE_OTHER): Admitting: Family Medicine

## 2024-01-11 ENCOUNTER — Telehealth: Payer: Self-pay

## 2024-01-11 ENCOUNTER — Ambulatory Visit: Payer: Self-pay | Admitting: Internal Medicine

## 2024-01-11 VITALS — BP 134/88 | HR 91 | Temp 98.6°F | Ht 68.0 in | Wt 212.4 lb

## 2024-01-11 DIAGNOSIS — I700 Atherosclerosis of aorta: Secondary | ICD-10-CM | POA: Diagnosis not present

## 2024-01-11 DIAGNOSIS — R7303 Prediabetes: Secondary | ICD-10-CM | POA: Diagnosis not present

## 2024-01-11 DIAGNOSIS — R7989 Other specified abnormal findings of blood chemistry: Secondary | ICD-10-CM

## 2024-01-11 DIAGNOSIS — R0902 Hypoxemia: Secondary | ICD-10-CM | POA: Diagnosis not present

## 2024-01-11 DIAGNOSIS — J432 Centrilobular emphysema: Secondary | ICD-10-CM | POA: Diagnosis not present

## 2024-01-11 NOTE — Telephone Encounter (Signed)
 Spoke with patients Husband, regarding todays appointment. Patient has been seen for this issue, 3 times after today. Patient has had imaging done, all came back fine per Casimer Clear. We have placed referrals for cardiology, and pulmonology. Patient has yet to reach out to them for scheduling. She is very emotional, worked up, concerned about her swelling. She refuses ED when advised to present there. I provided her husband with the referral contacts for a third time. He states "you all are missing the point. This is something that needs to be fixed like today. I am dealing with someone who cannot even put their shoes on.". I let him know that I understand this, however if the patient is not getting scheduled with the referrals we placed, our hands are becoming tied. He states specialist are booked out, I let him know I understood that as well, however it is still important to at least get her appointments scheduled, she may be placed on a cancellation list if appointment is too far out and may be able to be seen sooner if on the cancellation list. Trevor Fudge is happy to see the patient today, as her Husband feels this may "ease her mind" as stated at the appt on 04/14.Aaron Aas However, we have already made it clear to patient and her husband that this will not be an overnight fix, it will take time, and she HAS to be compliant with the advice and referrals we are giving her.

## 2024-01-11 NOTE — Progress Notes (Signed)
 Established Patient Office Visit  Subjective   Patient ID: Stacey [name], female    DOB: Jan 13, 1956  Age: 68 y.o. MRN: [id number]  Chief Complaint  Patient presents with   Edema   Shortness of Breath    Shortness of Breath   Patient presents today for medication management and follow-up of bilateral lower leg swelling. Reports compliance with medication regimen. Has tried compression socks, packet, and her self. At last visit, BMP, D-dimer was more elevated than the first, was instructed to go to the ED for further evaluation, they declined. She is anxious about going to the ER. She is very concerned about swelling today. States that she is still having to sleep sitting up, cannot breathe well when lying down. Has not had appointments with cardiology or pulmonology yet. Not fasting today. Denies other concerns. Medical history as outlined below.  Review of Systems  Respiratory:  Positive for shortness of breath.    Per HPI    Objective:     BP 134/88 (BP Location: Left Arm, Patient Position: Sitting)   Pulse 91   Temp 98.6 F (37 C) (Temporal)   Ht 5\' 8"  (1.727 m)   Wt 212 lb 6.4 oz (96.3 kg)   SpO2 90%   BMI 32.30 kg/m   Physical Exam Vitals and nursing note reviewed.  Constitutional:      General: She is not in acute distress.    Appearance: Normal appearance. She is obese.  HENT:     Head: Normocephalic and atraumatic.     Right Ear: External ear normal.     Left Ear: External ear normal.     Nose: Nose normal.     Mouth/Throat:     Mouth: Mucous membranes are moist.     Pharynx: Oropharynx is clear.  Eyes:     Extraocular Movements: Extraocular movements intact.     Pupils: Pupils are equal, round, and reactive to light.  Cardiovascular:     Rate and Rhythm: Normal rate and regular rhythm.     Pulses: Normal pulses.  Pulmonary:     Effort: Pulmonary effort is normal. No respiratory distress.     Breath sounds: Examination of the right-lower  field reveals decreased breath sounds. Examination of the left-lower field reveals decreased breath sounds. Decreased breath sounds present. No wheezing, rhonchi or rales.  Musculoskeletal:        General: Normal range of motion.     Cervical back: Normal range of motion.     Right lower leg: Edema (3+ pitting) present.     Left lower leg: Edema (3+ pitting) present.  Lymphadenopathy:     Cervical: No cervical adenopathy.  Skin:    Nails: There is clubbing.  Neurological:     General: No focal deficit present.     Mental Status: She is alert and oriented to person, place, and time.  Psychiatric:        Mood and Affect: Mood normal.        Thought Content: Thought content normal.      No results found for any visits on 01/11/24.   The 10-year ASCVD risk score (Arnett DK, et al., 2019) is: 13.5%    Assessment & Plan:   Hypoxia -     Ambulatory referral to Cardiology  Centrilobular emphysema [facility])  Aortic atherosclerosis (HCC) -     Ambulatory referral to Cardiology  Prediabetes  Elevated brain natriuretic peptide (BNP) level -     Ambulatory referral to Cardiology  Discussed that decreasing alcohol consumption and smoking would greatly help with swelling. Continue Lasix  and potassium supplementation. Change referrals for cardiology and pulmonology to urgent referrals. Discussed that if anything changes, if shortness of breath gets any worse, if swelling gets worse, if urinary output decreases at all, if if you are having chest pain or palpitations, stressed the need to go to the ER for further evaluation and treatment  Return if symptoms worsen or fail to improve, for As scheduled PCP.   I spent 25 minutes on this patient encounter including counseling, diagnosis, treatment options, documentation, face-to-face time.  Wellington Half, FNP

## 2024-01-11 NOTE — Telephone Encounter (Signed)
 Worsening ankle swelling  Symptoms: Lack of sleep, "wife is hysterical" per pt husband  Disposition: [x] Appointment(In office)  Additional Notes: Spoke with pt's husband, Susana Enter. Pt was last seen in office on 4/14 with Casimer Clear, FNP.  Pt was told that pulmonology and cardiology would call pt to schedule an appointment. Pt has not had a call yet. Pt was given lasix  at the last appointment and it is not working per pt husband. Pt scheduled for an appointment today with Casimer Clear, FNP. Pt husband agrees to plan and states understanding.    Copied from CRM [id number]. Topic: Clinical - Red Word Triage >> Jan 11, 2024 10:31 AM Chuck Crater wrote: [facility] that prompted transfer to Nurse Triage: Patient husband stated that wife  can't sleep and feet is swollen she can't wear shoes has been taking furosemide  (LASIX ) 20 MG tablet for 2 weeks and is not helping.  *Patient is taking a volume right now to calm down has been crying non stop. Reason for Disposition  SEVERE swelling (e.g., can't move swollen ankle at all)  Answer Assessment - Initial Assessment Questions Worsening ankle swelling  Symptoms: Lack of sleep, "wife is hysterical" per pt husband  Protocols used: Ankle Swelling-A-AH

## 2024-01-20 ENCOUNTER — Encounter: Payer: Self-pay | Admitting: Family Medicine

## 2024-01-20 ENCOUNTER — Encounter: Payer: Self-pay | Admitting: Cardiovascular Disease

## 2024-01-20 ENCOUNTER — Ambulatory Visit: Attending: Cardiovascular Disease | Admitting: Cardiovascular Disease

## 2024-01-20 VITALS — BP 112/74 | HR 103 | Ht 68.0 in | Wt 209.0 lb

## 2024-01-20 DIAGNOSIS — Z720 Tobacco use: Secondary | ICD-10-CM | POA: Diagnosis not present

## 2024-01-20 DIAGNOSIS — R0602 Shortness of breath: Secondary | ICD-10-CM

## 2024-01-20 DIAGNOSIS — M7989 Other specified soft tissue disorders: Secondary | ICD-10-CM | POA: Diagnosis not present

## 2024-01-20 DIAGNOSIS — I700 Atherosclerosis of aorta: Secondary | ICD-10-CM

## 2024-01-20 DIAGNOSIS — R601 Generalized edema: Secondary | ICD-10-CM | POA: Diagnosis not present

## 2024-01-20 DIAGNOSIS — R0902 Hypoxemia: Secondary | ICD-10-CM

## 2024-01-20 DIAGNOSIS — G4733 Obstructive sleep apnea (adult) (pediatric): Secondary | ICD-10-CM

## 2024-01-20 MED ORDER — IVABRADINE HCL 5 MG PO TABS
10.0000 mg | ORAL_TABLET | Freq: Once | ORAL | Status: AC
Start: 2024-01-20 — End: 2024-01-20

## 2024-01-20 MED ORDER — POTASSIUM CHLORIDE ER 20 MEQ PO TBCR: 40.0000 meq | EXTENDED_RELEASE_TABLET | Freq: Every day | ORAL | 1 refills | Status: DC

## 2024-01-20 MED ORDER — FUROSEMIDE 40 MG PO TABS: 40.0000 mg | ORAL_TABLET | Freq: Every day | ORAL | 3 refills | Status: AC

## 2024-01-20 MED ORDER — METOPROLOL TARTRATE 100 MG PO TABS: ORAL_TABLET | ORAL | 0 refills | Status: DC

## 2024-01-20 NOTE — Progress Notes (Signed)
 01/20/2024 Stacey [name]   Aug 29, 1956  [id number]  Primary Physician Adelia Homestead, MD Primary Cardiologist: Avanell Leigh MD Bennye Bravo, [place]  HPI:  Stacey [name] is a 68 y.o. mildly overweight married Caucasian female with no children is accompanied by her husband Tim today.  She is retired from [employer], community.  She is referred by her primary care provider, Casimer Clear, NP for evaluation of lower extremity edema.  [place] factors include 50-75-pack-year tobacco abuse currently smoking 1-1/2 packs/day.  She also drinks 3-4 beers daily and more on the weekends.  She has no history of diabetes, hypertension or hyperlipidemia.  Her father did die of a myocardial infarction at age 63 and her brother had bypass surgery in his 36s as well.  She is never had a heart attack or stroke.  She denies chest pain but does get some dyspnea on exertion.  She also has symptoms compatible with obstructive sleep apnea.  Last 3 weeks she is noted bilateral lower extremity edema.  She has lost a moderate amount of weight on oral furosemide .  Chest CT performed/8/25 showed no evidence of pulmonary embolism but dense calcification in the proximal LAD along with multi lobar emphysema.   Current Meds  Medication Sig   Calcium-Magnesium-Vitamin D  600-40-500 MG-MG-UNIT TB24 Take by mouth daily.   Cholecalciferol (VITAMIN D -3) 1000 UNITS CAPS Take 250 capsules by mouth daily.   diazepam  (VALIUM ) 5 MG tablet Take 1 tablet (5 mg total) by mouth daily as needed for anxiety. (Patient taking differently: Take 5 mg by mouth daily as needed for anxiety. prn)   furosemide  (LASIX ) 20 MG tablet Take 2 tablets (40 mg total) by mouth daily.   Multiple Vitamins-Minerals (CENTRUM SILVER) tablet Take 1 tablet by mouth daily.   Potassium Chloride  ER 20 MEQ TBCR Take 2 tablets (40 mEq total) by mouth daily.   vitamin B-12 (CYANOCOBALAMIN ) 1000 MCG tablet Take 1,000 mcg by mouth daily.   vitamin C  (ASCORBIC ACID) 500 MG tablet Take 1,000 mg by mouth daily.     Allergies  Allergen Reactions   Amoxicillin     REACTION: rash   Penicillins     REACTION: rash    Social History   Socioeconomic History   Marital status: Married    Spouse name: Not on file   Number of children: Not on file   Years of education: Not on file   Highest education level: Not on file  Occupational History   Not on file  Tobacco Use   Smoking status: Every Day    Current packs/day: 1.50    Average packs/day: 1.5 packs/day for 45.0 years (67.5 ttl pk-yrs)    Types: Cigarettes   Smokeless tobacco: Never  Substance and Sexual Activity   Alcohol use: Yes    Alcohol/week: 0.0 standard drinks of alcohol   Drug use: No   Sexual activity: Not on file  Other Topics Concern   Not on file  Social History Narrative   Not on file   Social Drivers of Health   Financial Resource Strain: Low Risk  (03/10/2023)   Overall Financial Resource Strain (CARDIA)    Difficulty of Paying Living Expenses: Not hard at all  Food Insecurity: No Food Insecurity (03/10/2023)   Hunger Vital Sign    Worried About Running Out of Food in the Last Year: Never true    Ran Out of Food in the Last Year: Never true  Transportation Needs: No Transportation Needs (  03/10/2023)   PRAPARE - Administrator, Civil Service (Medical): No    Lack of Transportation (Non-Medical): No  Physical Activity: Inactive (03/10/2023)   Exercise Vital Sign    Days of Exercise per Week: 0 days    Minutes of Exercise per Session: 0 min  Stress: No Stress Concern Present (03/10/2023)   [employer] of Occupational Health - Occupational Stress Questionnaire    Feeling of Stress : Not at all  Social Connections: Unknown (03/10/2023)   Social Connection and Isolation Panel [NHANES]    Frequency of Communication with Friends and Family: More than three times a week    Frequency of Social Gatherings with Friends and Family: More than three  times a week    Attends Religious Services: Patient unable to answer    Active Member of Clubs or Organizations: Yes    Attends Banker Meetings: More than 4 times per year    Marital Status: Married  Catering manager Violence: Not At Risk (03/10/2023)   Humiliation, Afraid, Rape, and Kick questionnaire    Fear of Current or Ex-Partner: No    Emotionally Abused: No    Physically Abused: No    Sexually Abused: No     Review of Systems: General: negative for chills, fever, night sweats or weight changes.  Cardiovascular: negative for chest pain, dyspnea on exertion, edema, orthopnea, palpitations, paroxysmal nocturnal dyspnea or shortness of breath Dermatological: negative for rash Respiratory: negative for cough or wheezing Urologic: negative for hematuria Abdominal: negative for nausea, vomiting, diarrhea, bright red blood per rectum, melena, or hematemesis Neurologic: negative for visual changes, syncope, or dizziness All other systems reviewed and are otherwise negative except as noted above.    Blood pressure 112/74, pulse (!) 103, height 5\' 8"  (1.727 m), weight 209 lb (94.8 kg), SpO2 94%.  General appearance: alert and no distress Neck: no adenopathy, no carotid bruit, no JVD, supple, symmetrical, trachea midline, and thyroid  not enlarged, symmetric, no tenderness/mass/nodules Lungs: clear to auscultation bilaterally Heart: regular rate and rhythm, S1, S2 normal, no murmur, click, rub or gallop Extremities: extremities normal, atraumatic, no cyanosis or edema Pulses: 2+ and symmetric Skin: Skin color, texture, turgor normal. No rashes or lesions Neurologic: Grossly normal  EKG not performed today      ASSESSMENT AND PLAN:   Tobacco abuse Ongoing tobacco abuse of 1-1/2 packs a day for the last 50 years.  She scheduled to see pulmonologist in the near future.  Hypoxia Oxygen saturations at home are in the high 80s.  Today she is 94%.  I suspect she has COPD  plus or minus oh overlying heart failure.  She did have a BNP performed by her PCP which was 202.  She has an appointment with a pulmonologist in the near future.  Aortic atherosclerosis (HCC) Aortic atherosclerosis on chest CT performed/8/25.  There is no evidence of pulmonary embolism.  She did have dense calcification in the proximal LAD.  I am going to get a coronary CTA to further evaluate.  Obstructive sleep apnea Symptoms compatible with a structural sleep apnea.  She has a appointment with a pulmonologist in the near future to discuss.     Avanell Leigh MD FACP,FACC,FAHA, [facility] 01/20/2024 2:07 PM

## 2024-01-20 NOTE — Assessment & Plan Note (Signed)
 Aortic atherosclerosis on chest CT performed/8/25.  There is no evidence of pulmonary embolism.  She did have dense calcification in the proximal LAD.  I am going to get a coronary CTA to further evaluate.

## 2024-01-20 NOTE — Assessment & Plan Note (Signed)
 Ongoing tobacco abuse of 1-1/2 packs a day for the last 50 years.  She scheduled to see pulmonologist in the near future.

## 2024-01-20 NOTE — Patient Instructions (Addendum)
 Medication Instructions:  Your physician recommends that you continue on your current medications as directed. Please refer to the Current Medication list given to you today.  *If you need a refill on your cardiac medications before your next appointment, please call your pharmacy*   Testing/Procedures: Your physician has requested that you have an echocardiogram. Echocardiography is a painless test that uses sound waves to create images of your heart. It provides your doctor with information about the size and shape of your heart and how well your heart's chambers and valves are working. This procedure takes approximately one hour. There are no restrictions for this procedure. Please do NOT wear cologne, perfume, aftershave, or lotions (deodorant is allowed). Please arrive 15 minutes prior to your appointment time.  Please note: We ask at that you not bring children with you during ultrasound (echo/ vascular) testing. Due to room size and safety concerns, children are not allowed in the ultrasound rooms during exams. Our front office staff cannot provide observation of children in our lobby area while testing is being conducted. An adult accompanying a patient to their appointment will only be allowed in the ultrasound room at the discretion of the ultrasound technician under special circumstances. We apologize for any inconvenience.   Follow-Up: At [facility], you and your health needs are our priority.  As part of our continuing mission to provide you with exceptional heart care, our providers are all part of one team.  This team includes your primary Cardiologist (physician) and Advanced Practice Providers or APPs (Physician Assistants and Nurse Practitioners) who all work together to provide you with the care you need, when you need it.  Your next appointment:   4-6 week(s)  Provider:   Palmer Bobo, NP, Sharren Decree, PA-C, Kathleen Johnson, PA-C, Hao Meng, PA-C, Marlana Silvan, NP, or Katlyn West, NP          We recommend signing up for the patient portal called "MyChart".  Sign up information is provided on this After Visit Summary.  MyChart is used to connect with patients for Virtual Visits (Telemedicine).  Patients are able to view lab/test results, encounter notes, upcoming appointments, etc.  Non-urgent messages can be sent to your provider as well.   To learn more about what you can do with MyChart, go to [email or web address].   Other Instructions   Your cardiac CT will be scheduled at one of the below locations:    Jeralene Mom. [facility] and Vascular Tower [address]  [place], [place] [address] Opening January 18, 2024  If scheduled at [facility], please arrive at the [facility] and Children's Entrance (Entrance C2) of [facility] 30 minutes prior to test start time. You can use the FREE valet parking offered at entrance C (encouraged to control the heart rate for the test)  Proceed to the [facility] Radiology Department (first floor) to check-in and test prep.   All radiology patients and guests should use entrance C2 at [facility], accessed from [facility], even though the hospital's physical address listed is [address].    If scheduled at the Heart and Vascular Tower at [address], please enter the parking lot using the [address] entrance and use the FREE valet service at the patient drop-off area. Enter the buidling and check-in with registration on the main floor.   Please follow these instructions carefully (unless otherwise directed):  An IV will be required for this test and Nitroglycerin will  be given.  Hold all erectile dysfunction medications at least 3 days (72 hrs) prior to test. (Ie viagra, cialis, sildenafil, tadalafil, etc)   On the Night Before the Test: Be sure to Drink plenty of water. Do not consume any caffeinated/decaffeinated beverages or chocolate 12  hours prior to your test. Do not take any antihistamines 12 hours prior to your test.  On the Day of the Test: Drink plenty of water until 1 hour prior to the test. Do not eat any food 1 hour prior to test. You may take your regular medications prior to the test.  Take metoprolol (Lopressor) 100mg  two hours prior to test. Take ivabridine 10mg  two hours prior to procedure. (2 tablets) If you take Furosemide /Hydrochlorothiazide/Spironolactone/Chlorthalidone, please HOLD on the morning of the test. Patients who wear a continuous glucose monitor MUST remove the device prior to scanning. FEMALES- please wear underwire-free bra if available, avoid dresses & tight clothing       After the Test: Drink plenty of water. After receiving IV contrast, you may experience a mild flushed feeling. This is normal. On occasion, you may experience a mild rash up to 24 hours after the test. This is not dangerous. If this occurs, you can take Benadryl 25 mg, Zyrtec, Claritin , or Allegra and increase your fluid intake. (Patients taking Tikosyn should avoid Benadryl, and may take Zyrtec, Claritin , or Allegra) If you experience trouble breathing, this can be serious. If it is severe call 911 IMMEDIATELY. If it is mild, please call our office.  We will call to schedule your test 2-4 weeks out understanding that some insurance companies will need an authorization prior to the service being performed.   For more information and frequently asked questions, please visit our website : [email or web address]  For non-scheduling related questions, please contact the cardiac imaging nurse navigator should you have any questions/concerns: Cardiac Imaging Nurse Navigators Direct Office Dial: [phone]   For scheduling needs, including cancellations and rescheduling, please call [place], [phone].

## 2024-01-20 NOTE — Assessment & Plan Note (Signed)
 Oxygen saturations at home are in the high 80s.  Today she is 94%.  I suspect she has COPD plus or minus oh overlying heart failure.  She did have a BNP performed by her PCP which was 202.  She has an appointment with a pulmonologist in the near future.

## 2024-01-20 NOTE — Assessment & Plan Note (Signed)
 Symptoms compatible with a structural sleep apnea.  She has a appointment with a pulmonologist in the near future to discuss.

## 2024-01-22 ENCOUNTER — Ambulatory Visit: Admitting: Cardiology

## 2024-02-02 ENCOUNTER — Encounter (HOSPITAL_COMMUNITY): Payer: Self-pay

## 2024-02-03 ENCOUNTER — Telehealth (HOSPITAL_COMMUNITY): Payer: Self-pay | Admitting: *Deleted

## 2024-02-03 NOTE — Telephone Encounter (Signed)

## 2024-02-04 ENCOUNTER — Ambulatory Visit (HOSPITAL_COMMUNITY)
Admission: RE | Admit: 2024-02-04 | Discharge: 2024-02-04 | Disposition: A | Discharge status: Home / Self Care | Source: Ambulatory Visit | Attending: Cardiovascular Disease | Admitting: Cardiovascular Disease

## 2024-02-04 DIAGNOSIS — I517 Cardiomegaly: Secondary | ICD-10-CM | POA: Insufficient documentation

## 2024-02-04 DIAGNOSIS — R0602 Shortness of breath: Secondary | ICD-10-CM | POA: Diagnosis not present

## 2024-02-04 DIAGNOSIS — I2510 Atherosclerotic heart disease of native coronary artery without angina pectoris: Secondary | ICD-10-CM | POA: Insufficient documentation

## 2024-02-04 DIAGNOSIS — I700 Atherosclerosis of aorta: Secondary | ICD-10-CM

## 2024-02-04 DIAGNOSIS — R601 Generalized edema: Secondary | ICD-10-CM | POA: Diagnosis not present

## 2024-02-04 DIAGNOSIS — R0902 Hypoxemia: Secondary | ICD-10-CM | POA: Insufficient documentation

## 2024-02-04 MED ORDER — NITROGLYCERIN 0.4 MG SL SUBL
SUBLINGUAL_TABLET | SUBLINGUAL | Status: AC
  Filled 2024-02-04: qty 2

## 2024-02-04 MED ORDER — NITROGLYCERIN 0.4 MG SL SUBL
0.8000 mg | SUBLINGUAL_TABLET | Freq: Once | SUBLINGUAL | Status: AC
  Administered 2024-02-04: 0.8 mg via SUBLINGUAL

## 2024-02-04 MED ORDER — IOHEXOL 350 MG/ML SOLN
100.0000 mL | Freq: Once | INTRAVENOUS | Status: AC | PRN
Start: 2024-02-04 — End: 2024-02-04
  Administered 2024-02-04: 100 mL via INTRAVENOUS

## 2024-02-05 ENCOUNTER — Ambulatory Visit: Payer: Self-pay | Admitting: Cardiovascular Disease

## 2024-02-22 ENCOUNTER — Ambulatory Visit (HOSPITAL_COMMUNITY)
Admission: RE | Admit: 2024-02-22 | Discharge: 2024-02-22 | Disposition: A | Discharge status: Home / Self Care | Source: Ambulatory Visit | Attending: Cardiovascular Disease | Admitting: Cardiovascular Disease

## 2024-02-22 DIAGNOSIS — R0902 Hypoxemia: Secondary | ICD-10-CM | POA: Diagnosis not present

## 2024-02-22 DIAGNOSIS — I700 Atherosclerosis of aorta: Secondary | ICD-10-CM | POA: Insufficient documentation

## 2024-02-22 DIAGNOSIS — Z720 Tobacco use: Secondary | ICD-10-CM | POA: Insufficient documentation

## 2024-02-22 DIAGNOSIS — M7989 Other specified soft tissue disorders: Secondary | ICD-10-CM | POA: Insufficient documentation

## 2024-02-22 DIAGNOSIS — R0602 Shortness of breath: Secondary | ICD-10-CM

## 2024-02-22 DIAGNOSIS — R601 Generalized edema: Secondary | ICD-10-CM | POA: Insufficient documentation

## 2024-02-22 DIAGNOSIS — G4733 Obstructive sleep apnea (adult) (pediatric): Secondary | ICD-10-CM | POA: Diagnosis not present

## 2024-02-22 LAB — ECHOCARDIOGRAM COMPLETE
Area-P 1/2: 2.95 cm2
S' Lateral: 3.65 cm
Single Plane A4C EF: 44.5 %

## 2024-02-29 DIAGNOSIS — Z1231 Encounter for screening mammogram for malignant neoplasm of breast: Secondary | ICD-10-CM | POA: Diagnosis not present

## 2024-02-29 LAB — HM MAMMOGRAPHY

## 2024-02-29 NOTE — Progress Notes (Unsigned)
 Cardiology Office Note:    Date:  02/29/2024   ID:  Stacey [name], DOB 07-Dec-1955, MRN [id number]  PCP:  Adelia Homestead, MD  Cardiologist:  None { Click to update primary MD,subspecialty MD or APP then REFRESH:1}    Referring MD: Adelia Homestead, *   Chief Complaint: follow-up of recent coronary CTA and Echo  History of Present Illness:    Stacey [name] is a 68 y.o. female with a history of minimal non-obstructive CAD noted on recent coronary CTA in 01/2024, chronic HFmrEF with EF of 45-50%, suspected COPD, suspect obstructive sleep apnea, tobacco abuse, and alcohol abuse who is followed by Dr. Katheryne Pane and presents today for follow-up of coronary CTA and Echo.   Patient was referred to Dr. Katheryne Pane in 12/2023 for further evaluation of lower extremity edema. She denied any chest pain at that time but did report some dyspnea on exertion as well as symptoms suggestive of obstructive sleep apnea. She also noted O2 sats in the high 80s at home. She was suspected to have COPD +/- CHF. Coronary calcium score and Echo were ordered for further evaluation. Coronary CTA showed a coronary calcium score of 229 and total plaque volume of 112 (38th percentile for age and sex) with only minimal non-obstructive CAD as well as a enlarged main pulmonary artery suggestive of possible pulmonary hypertension. Echo showed LVEF of 45-50% with global hypokinesis and grade 1 diastolic dysfunction, normal RV function, and no significant valvular disease.   She presents today for follow-up. ***  Minimal Non-Obstructive CAD Coronary CTA in 01/2024 showed a coronary calcium score of 229 and total plaque volume of 112 (38th percentile for age and sex) with only minimal non-obstructive CAD. - No chest pain. - Aspirin *** - Statin ***  Chronic HFmrEF Recent Echo on 02/22/2024 showed LVEF of 45-50% with global hypokinesis and grade 1 diastolic dysfunction, normal RV function, and no significant valvular disease.  -  Euvolemic on exam. *** - Continue Lasix  40mg  daily. *** - Will start Losartan *** - Will start Toprol -XL 25mg  daily.  - SGLT2 inhibitor *** - Discussed importance of daily weights and sodium/ fluid restrictions.   Suspected Obstructive Sleep Apnea *** - She is already scheduled to see Pulmonology next month.   Tobacco Abuse ***  Alcohol Abuse ***  EKGs/Labs/Other Studies Reviewed:    The following studies were reviewed:  Coronary CTA 02/04/2024: Impressions: 1. Coronary calcium score of 229. 2. Total plaque volume 112 mm3 which is 38 percentile for age- and sex-matched controls (calcified plaque 64mm3; non-calcified plaque 27mm3). TPV is (moderate). 3. Normal coronary origin with right dominance. 4. Calcified proximal LAD with minimal stenosis 0-24%, non flow-limiting. 5. Aortic atherosclerosis. 6. Mildly dilated main pulmonary artery 32 mm suggestive elevated pulmonary pressures possibly related to tobacco use. _______________  Echocardiogram 02/22/2024: Impressions: 1. Left ventricular ejection fraction, by estimation, is 45 to 50%. The  left ventricle has mildly decreased function. The left ventricle  demonstrates global hypokinesis. Left ventricular diastolic parameters are  consistent with Grade I diastolic  dysfunction (impaired relaxation). Elevated left atrial pressure.   2. Right ventricular systolic function is normal. The right ventricular  size is normal. Tricuspid regurgitation signal is inadequate for assessing  PA pressure.   3. Left atrial size was mildly dilated.   4. The mitral valve is normal in structure. No evidence of mitral valve  regurgitation. No evidence of mitral stenosis.   5. The aortic valve is tricuspid. Aortic valve regurgitation is not  visualized. No aortic stenosis is present.   6. The inferior vena cava is normal in size with greater than 50%  respiratory variability, suggesting right atrial pressure of 3 mmHg.   EKG:  EKG not  ordered today.   Recent Labs: 03/10/2023: TSH 1.28 01/04/2024: ALT 28; BUN 13; Creatinine, Ser 0.78; Hemoglobin 17.5; Platelets 183.0; Potassium 3.8; Pro B Natriuretic peptide (BNP) 202.0; Sodium 139  Recent Lipid Panel    Component Value Date/Time   CHOL 158 12/28/2023 1535   TRIG 86.0 12/28/2023 1535   HDL 66.80 12/28/2023 1535   CHOLHDL 2 12/28/2023 1535   VLDL 17.2 12/28/2023 1535   LDLCALC 74 12/28/2023 1535    Physical Exam:    Vital Signs: There were no vitals taken for this visit.    Wt Readings from Last 3 Encounters:  01/20/24 209 lb (94.8 kg)  01/11/24 212 lb 6.4 oz (96.3 kg)  01/04/24 216 lb (98 kg)     General: 68 y.o. female in no acute distress. HEENT: Normocephalic and atraumatic. Sclera clear.  Neck: Supple. No carotid bruits. No JVD. Heart: *** RRR. Distinct S1 and S2. No murmurs, gallops, or rubs.  Lungs: No increased work of breathing. Clear to ausculation bilaterally. No wheezes, rhonchi, or rales.  Abdomen: Soft, non-distended, and non-tender to palpation.  Extremities: No lower extremity edema.  Radial and distal pedal pulses 2+ and equal bilaterally. Skin: Warm and dry. Neuro: No focal deficits. Psych: Normal affect. Responds appropriately.   Assessment:    No diagnosis found.  Plan:     Disposition: Follow up in ***   Signed, Aamori Mcmasters E Areon Cocuzza, PA-C  02/29/2024 9:05 AM    [facility]

## 2024-03-01 ENCOUNTER — Ambulatory Visit: Admitting: Cardiology

## 2024-03-03 ENCOUNTER — Encounter: Payer: Self-pay | Admitting: Student

## 2024-03-03 ENCOUNTER — Ambulatory Visit: Attending: Student | Admitting: Student

## 2024-03-03 VITALS — BP 138/82 | HR 99 | Ht 68.0 in | Wt 207.4 lb

## 2024-03-03 DIAGNOSIS — I5022 Chronic systolic (congestive) heart failure: Secondary | ICD-10-CM | POA: Diagnosis not present

## 2024-03-03 DIAGNOSIS — I2510 Atherosclerotic heart disease of native coronary artery without angina pectoris: Secondary | ICD-10-CM | POA: Diagnosis not present

## 2024-03-03 DIAGNOSIS — E785 Hyperlipidemia, unspecified: Secondary | ICD-10-CM | POA: Diagnosis not present

## 2024-03-03 DIAGNOSIS — R0902 Hypoxemia: Secondary | ICD-10-CM | POA: Diagnosis not present

## 2024-03-03 DIAGNOSIS — F1010 Alcohol abuse, uncomplicated: Secondary | ICD-10-CM

## 2024-03-03 DIAGNOSIS — Z720 Tobacco use: Secondary | ICD-10-CM

## 2024-03-03 DIAGNOSIS — G629 Polyneuropathy, unspecified: Secondary | ICD-10-CM

## 2024-03-03 DIAGNOSIS — Z79899 Other long term (current) drug therapy: Secondary | ICD-10-CM

## 2024-03-03 MED ORDER — SPIRONOLACTONE 25 MG PO TABS: 25.0000 mg | ORAL_TABLET | Freq: Every day | ORAL | 2 refills | Status: AC

## 2024-03-03 NOTE — Patient Instructions (Addendum)
 Medication Instructions:   STOP TAKING POTASSIUM NOW  START TAKING SPIRONOLACTONE 25 MG BY MOUTH DAILY  *If you need a refill on your cardiac medications before your next appointment, please call your pharmacy*   Lab Work:  IN ONE WEEK HERE IN THE OFFICE FIRST FLOOR AT [facility]  If you have labs (blood work) drawn today and your tests are completely normal, you will receive your results only by: MyChart Message (if you have MyChart) OR A paper copy in the mail If you have any lab test that is abnormal or we need to change your treatment, we will call you to review the results.    Follow-Up:  3 MONTHS WITH CALLIE GOODRICH PA-C   Other Instructions  PLEASE CONTINUE MONITORING YOUR OXYGEN LEVELS AT HOME AND CALL US  BACK IF YOUR READINGS ARE SHOWING CONSISTENTLY IN THE LOW 80's ON ROOM AIR  PLEASE FOLLOW-UP WITH PULMONOLOGY AS SCHEDULED.

## 2024-03-15 DIAGNOSIS — Z720 Tobacco use: Secondary | ICD-10-CM | POA: Diagnosis not present

## 2024-03-15 DIAGNOSIS — I5022 Chronic systolic (congestive) heart failure: Secondary | ICD-10-CM | POA: Diagnosis not present

## 2024-03-15 DIAGNOSIS — I2510 Atherosclerotic heart disease of native coronary artery without angina pectoris: Secondary | ICD-10-CM | POA: Diagnosis not present

## 2024-03-15 DIAGNOSIS — F1010 Alcohol abuse, uncomplicated: Secondary | ICD-10-CM | POA: Diagnosis not present

## 2024-03-16 LAB — BASIC METABOLIC PANEL WITH GFR
BUN/Creatinine Ratio: 20 (ref 12–28)
BUN: 18 mg/dL (ref 8–27)
CO2: 27 mmol/L (ref 20–29)
Calcium: 9.7 mg/dL (ref 8.7–10.3)
Chloride: 97 mmol/L (ref 96–106)
Creatinine, Ser: 0.88 mg/dL (ref 0.57–1.00)
Glucose: 102 mg/dL — ABNORMAL HIGH (ref 70–99)
Potassium: 4.9 mmol/L (ref 3.5–5.2)
Sodium: 142 mmol/L (ref 134–144)
eGFR: 71 mL/min/{1.73_m2} (ref >59)

## 2024-03-17 ENCOUNTER — Ambulatory Visit: Payer: Self-pay | Admitting: Student

## 2024-03-17 ENCOUNTER — Ambulatory Visit: Payer: Medicare Other

## 2024-03-17 VITALS — BP 108/70 | HR 99 | Ht 68.0 in | Wt 205.0 lb

## 2024-03-17 DIAGNOSIS — Z0000 Encounter for general adult medical examination without abnormal findings: Secondary | ICD-10-CM | POA: Diagnosis not present

## 2024-03-17 NOTE — Progress Notes (Addendum)
 Subjective:   Stacey [name] is a 68 y.o. who presents for a Medicare Wellness preventive visit.  As a reminder, Annual Wellness Visits don't include a physical exam, and some assessments may be limited, especially if this visit is performed virtually. We may recommend an in-person follow-up visit with your provider if needed.  Visit Complete: In person  Persons Participating in Visit: Patient.  AWV Questionnaire: No: Patient Medicare AWV questionnaire was not completed prior to this visit.  Cardiac Risk Factors include: advanced age (>32men, >26 women);Other (see comment), Risk factor comments: Hx of left breast cancer     Objective:    Today's Vitals   03/17/24 0805  BP: 108/70  Pulse: 99  SpO2: 91%  Weight: 205 lb (93 kg)  Height: 5' 8 (1.727 m)   Body mass index is 31.17 kg/m.     03/17/2024    8:18 AM 03/10/2023    9:16 AM  Advanced Directives  Does Patient Have a Medical Advance Directive? Yes Yes  Type of Estate agent of [employer] Power of [place];Living will  Copy of Healthcare Power of Attorney in Chart? No - copy requested No - copy requested    Current Medications (verified) Outpatient Encounter Medications as of 03/17/2024  Medication Sig   aspirin EC 81 MG tablet Take 81 mg by mouth daily. Swallow whole.   Calcium-Magnesium-Vitamin D  600-40-500 MG-MG-UNIT TB24 Take by mouth daily.   Cholecalciferol (VITAMIN D -3) 1000 UNITS CAPS Take 250 capsules by mouth daily.   diazepam  (VALIUM ) 5 MG tablet Take 1 tablet (5 mg total) by mouth daily as needed for anxiety.   furosemide  (LASIX ) 40 MG tablet Take 1 tablet (40 mg total) by mouth daily.   Multiple Vitamins-Minerals (CENTRUM SILVER) tablet Take 1 tablet by mouth daily.   spironolactone  (ALDACTONE ) 25 MG tablet Take 1 tablet (25 mg total) by mouth daily.   vitamin B-12 (CYANOCOBALAMIN ) 1000 MCG tablet Take 1,000 mcg by mouth daily.   vitamin C (ASCORBIC ACID) 500 MG tablet Take  1,000 mg by mouth daily.   No facility-administered encounter medications on file as of 03/17/2024.    Allergies (verified) Amoxicillin and Penicillins   History: Past Medical History:  Diagnosis Date   Aortic atherosclerosis (HCC)    Breast cancer (HCC) 09/22/2006   L side, s/p lumpectomy, chemotherapy and radiation   Centrilobular emphysema (HCC)    Edema    Leg swelling    Panic disorder    SOB (shortness of breath)    Tobacco abuse    Past Surgical History:  Procedure Laterality Date   BREAST LUMPECTOMY  2008   Family History  Problem Relation Age of Onset   Diabetes Other    Diabetes Mother    Heart disease Father    Breast cancer Sister    Heart disease Brother    Breast cancer Paternal Aunt    Colon cancer Paternal Aunt    Heart disease Maternal Grandfather    Celiac disease Paternal Grandmother    Heart disease Paternal Grandfather    Social History   Socioeconomic History   Marital status: Married    Spouse name: [name]   Number of children: Not on file   Years of education: Not on file   Highest education level: Not on file  Occupational History   Occupation: RETIRED  Tobacco Use   Smoking status: Every Day    Current packs/day: 1.50    Average packs/day: 1.5 packs/day for 45.0 years (67.5 ttl  pk-yrs)    Types: Cigarettes   Smokeless tobacco: Never  Vaping Use   Vaping status: Never Used  Substance and Sexual Activity   Alcohol use: Yes    Alcohol/week: 0.0 standard drinks of alcohol   Drug use: No   Sexual activity: Not on file  Other Topics Concern   Not on file  Social History Narrative   Lives with husband/2025   Social Drivers of Health   Financial Resource Strain: Low Risk  (03/17/2024)   Overall Financial Resource Strain (CARDIA)    Difficulty of Paying Living Expenses: Not very hard  Food Insecurity: No Food Insecurity (03/17/2024)   Hunger Vital Sign    Worried About Running Out of Food in the Last Year: Never true    Ran Out  of Food in the Last Year: Never true  Transportation Needs: No Transportation Needs (03/17/2024)   PRAPARE - Administrator, Civil Service (Medical): No    Lack of Transportation (Non-Medical): No  Physical Activity: Inactive (03/17/2024)   Exercise Vital Sign    Days of Exercise per Week: 0 days    Minutes of Exercise per Session: 0 min  Stress: Stress Concern Present (03/17/2024)   [employer] of Occupational Health - Occupational Stress Questionnaire    Feeling of Stress: To some extent  Social Connections: Moderately Isolated (03/17/2024)   Social Connection and Isolation Panel    Frequency of Communication with Friends and Family: Three times a week    Frequency of Social Gatherings with Friends and Family: Not on file    Attends Religious Services: Never    Database administrator or Organizations: No    Attends Engineer, structural: Never    Marital Status: Married    Tobacco Counseling Ready to quit: Not Answered Counseling given: Not Answered    Clinical Intake:  Pre-visit preparation completed: Yes  Pain : No/denies pain     BMI - recorded: 31.17 Nutritional Status: BMI > 30  Obese Nutritional Risks: None  Lab Results  Component Value Date   HGBA1C 6.5 12/28/2023   HGBA1C 6.4 03/10/2023   HGBA1C 6.2 02/28/2022     How often do you need to have someone help you when you read instructions, pamphlets, or other written materials from your doctor or pharmacy?: 1 - Never  Interpreter Needed?: No  Information entered by :: Evva Din, RMA   Activities of Daily Living     03/17/2024    7:58 AM  In your present state of health, do you have any difficulty performing the following activities:  Hearing? 1  Comment rt ear hearing loss  Vision? 0  Difficulty concentrating or making decisions? 0  Walking or climbing stairs? 0  Dressing or bathing? 0  Doing errands, shopping? 0  Preparing Food and eating ? N  Using the Toilet? N  In  the past six months, have you accidently leaked urine? N  Do you have problems with loss of bowel control? N  Managing your Medications? N  Managing your Finances? N  Housekeeping or managing your Housekeeping? N    Patient Care Team: Rollene Almarie LABOR, MD as PCP - General (Internal Medicine) Court Dorn PARAS, MD as PCP - Cardiology (Cardiology) Teressa Toribio SQUIBB, MD (Inactive) (Gastroenterology) Williford, Devere POUR., MD (Hematology and Oncology) I have updated your Care Teams any recent Medical Services you may have received from other providers in the past year.     Assessment:   This is  a routine wellness examination for [place].  Hearing/Vision screen Hearing Screening - Comments:: rt ear hearing loss Vision Screening - Comments:: Denies vision issues.    Goals Addressed               This Visit's Progress     Patient Stated (pt-stated)        To quit smoking-2025       Depression Screen     03/17/2024    8:25 AM 12/28/2023    2:40 PM 03/10/2023    9:17 AM 03/10/2023    9:15 AM 03/07/2022    8:15 AM 08/27/2021    3:25 PM 06/22/2020    2:40 PM  PHQ 2/9 Scores  PHQ - 2 Score 3 0 0 0 0 0 6  PHQ- 9 Score 3  0 0       Fall Risk     03/17/2024    8:20 AM 12/28/2023    2:40 PM 03/10/2023    9:17 AM 03/10/2023    9:16 AM 03/07/2022    8:15 AM  Fall Risk   Falls in the past year? 0 0 0 0 0  Number falls in past yr: 0 0 0 0   Injury with Fall? 0 0 0 0   Risk for fall due to :  No Fall Risks No Fall Risks    Follow up Falls evaluation completed;Falls prevention discussed Falls evaluation completed Falls prevention discussed Falls evaluation completed     MEDICARE RISK AT HOME:  Medicare Risk at Home Any stairs in or around the home?: Yes (3 story home) If so, are there any without handrails?: No Home free of loose throw rugs in walkways, pet beds, electrical cords, etc?: Yes Adequate lighting in your home to reduce risk of falls?: Yes Life alert?: No Use of a  cane, walker or w/c?: No Grab bars in the bathroom?: Yes Shower chair or bench in shower?: No Elevated toilet seat or a handicapped toilet?: No  TIMED UP AND GO:  Was the test performed?  Yes  Length of time to ambulate 10 feet: 15 sec Gait steady and fast without use of assistive device  Cognitive Function: Declined/Normal: No cognitive concerns noted by patient or family. Patient alert, oriented, able to answer questions appropriately and recall recent events. No signs of memory loss or confusion.        03/10/2023    9:18 AM  6CIT Screen  What Year? 0 points  What month? 0 points  What time? 0 points  Count back from 20 0 points  Months in reverse 0 points  Repeat phrase 0 points  Total Score 0 points    Immunizations Immunization History  Administered Date(s) Administered   Fluad Quad(high Dose 65+) 08/27/2021   Influenza Split 07/21/2018   Influenza,inj,Quad PF,6+ Mos 07/21/2018   PFIZER(Purple Top)SARS-COV-2 Vaccination 12/15/2019, 01/05/2020   Td 09/22/2008   Tdap 09/22/2008    Screening Tests Health Maintenance  Topic Date Due   DTaP/Tdap/Td (3 - Td or Tdap) 09/22/2018   COVID-19 Vaccine (3 - Pfizer risk series) 02/02/2020   INFLUENZA VACCINE  04/22/2024   Lung Cancer Screening  02/03/2025   Medicare Annual Wellness (AWV)  03/17/2025   MAMMOGRAM  02/28/2026   Colonoscopy  04/07/2026   DEXA SCAN  Completed   Hepatitis C Screening  Completed   Hepatitis B Vaccines  Aged Out   HPV VACCINES  Aged Out   Meningococcal B Vaccine  Aged Out   Pneumococcal  Vaccine: 50+ Years  Discontinued   Zoster Vaccines- Shingrix  Discontinued    Health Maintenance  Health Maintenance Due  Topic Date Due   DTaP/Tdap/Td (3 - Td or Tdap) 09/22/2018   COVID-19 Vaccine (3 - Pfizer risk series) 02/02/2020   Health Maintenance Items Addressed: See Nurse Notes at the end of this note  Additional Screening:  Vision Screening: Recommended annual ophthalmology exams for  early detection of glaucoma and other disorders of the eye. Would you like a referral to an eye doctor? Yes    Dental Screening: Recommended annual dental exams for proper oral hygiene  Community Resource Referral / Chronic Care Management: CRR required this visit?  No   CCM required this visit?  No   Plan:    I have personally reviewed and noted the following in the patient's chart:   Medical and social history Use of alcohol, tobacco or illicit drugs  Current medications and supplements including opioid prescriptions. Patient is not currently taking opioid prescriptions. Functional ability and status Nutritional status Physical activity Advanced directives List of other physicians Hospitalizations, surgeries, and ER visits in previous 12 months Vitals Screenings to include cognitive, depression, and falls Referrals and appointments  In addition, I have reviewed and discussed with patient certain preventive protocols, quality metrics, and best practice recommendations. A written personalized care plan for preventive services as well as general preventive health recommendations were provided to patient.   Ashrita Chrismer L Vaness Jelinski, CMA   03/17/2024   After Visit Summary: (MyChart) Due to this being a telephonic visit, the after visit summary with patients personalized plan was offered to patient via MyChart   Notes: Patient stated that she had a recent visit with cardiology for swelling in her lower extremities.  Patient also stated that she had also been SOB, however she is better today and I have noticed there was no swelling today.  Patient has scheduled to see Dr. Rollene on 03/29/24, to discuss her recent office visits.  Patient would also like Dr. Rollene to check her B12 levels during her next office visit. She declines all vaccines at this time.

## 2024-03-17 NOTE — Progress Notes (Deleted)
 Subjective:   Stacey [name] is a 68 y.o. who presents for a Medicare Wellness preventive visit.  As a reminder, Annual Wellness Visits don't include a physical exam, and some assessments may be limited, especially if this visit is performed virtually. We may recommend an in-person follow-up visit with your provider if needed.  Visit Complete: In person  Persons Participating in Visit: Patient.  AWV Questionnaire: [name]: Patient Medicare AWV questionnaire was not completed prior to this visit.  Cardiac Risk Factors include: advanced age (>32men, >90 women);Other (see comment)     Objective:    Today's Vitals   03/17/24 0805  Weight: 205 lb (93 kg)  Height: 5' 8 (1.727 m)   Body mass index is 31.17 kg/m.     03/17/2024    8:18 AM 03/10/2023    9:16 AM  Advanced Directives  Does Patient Have a Medical Advance Directive? Yes Yes  Type of Estate agent of [employer] Power of [place];Living will  Copy of Healthcare Power of Attorney in Chart? [name] - copy requested [name] - copy requested    Current Medications (verified) Outpatient Encounter Medications as of 03/17/2024  Medication Sig   aspirin EC 81 MG tablet Take 81 mg by mouth daily. Swallow whole.   Calcium-Magnesium-Vitamin D  600-40-500 MG-MG-UNIT TB24 Take by mouth daily.   Cholecalciferol (VITAMIN D -3) 1000 UNITS CAPS Take 250 capsules by mouth daily.   diazepam  (VALIUM ) 5 MG tablet Take 1 tablet (5 mg total) by mouth daily as needed for anxiety.   furosemide  (LASIX ) 40 MG tablet Take 1 tablet (40 mg total) by mouth daily.   Multiple Vitamins-Minerals (CENTRUM SILVER) tablet Take 1 tablet by mouth daily.   spironolactone  (ALDACTONE ) 25 MG tablet Take 1 tablet (25 mg total) by mouth daily.   vitamin B-12 (CYANOCOBALAMIN ) 1000 MCG tablet Take 1,000 mcg by mouth daily.   vitamin C (ASCORBIC ACID) 500 MG tablet Take 1,000 mg by mouth daily.   [name] facility-administered encounter medications on file  as of 03/17/2024.    Allergies (verified) Amoxicillin and Penicillins   History: Past Medical History:  Diagnosis Date   Aortic atherosclerosis (HCC)    Breast cancer (HCC) 09/22/2006   L side, s/p lumpectomy, chemotherapy and radiation   Centrilobular emphysema (HCC)    Edema    Leg swelling    Panic disorder    SOB (shortness of breath)    Tobacco abuse    Past Surgical History:  Procedure Laterality Date   BREAST LUMPECTOMY  2008   Family History  Problem Relation Age of Onset   Diabetes Other    Diabetes Mother    Heart disease Father    Breast cancer Sister    Heart disease Brother    Breast cancer Paternal Aunt    Colon cancer Paternal Aunt    Heart disease Maternal Grandfather    Celiac disease Paternal Grandmother    Heart disease Paternal Grandfather    Social History   Socioeconomic History   Marital status: Married    Spouse name: [name]   Number of children: Not on file   Years of education: Not on file   Highest education level: Not on file  Occupational History   Occupation: RETIRED  Tobacco Use   Smoking status: Every Day    Current packs/day: 1.50    Average packs/day: 1.5 packs/day for 45.0 years (67.5 ttl pk-yrs)    Types: Cigarettes   Smokeless tobacco: Never  Vaping Use   Vaping  status: Never Used  Substance and Sexual Activity   Alcohol use: Yes    Alcohol/week: 0.0 standard drinks of alcohol   Drug use: [name]   Sexual activity: Not on file  Other Topics Concern   Not on file  Social History Narrative   Lives with husband/2025   Social Drivers of Health   Financial Resource Strain: Low Risk  (03/10/2023)   Overall Financial Resource Strain (CARDIA)    Difficulty of Paying Living Expenses: Not hard at all  Food Insecurity: [name] Food Insecurity (03/10/2023)   Hunger Vital Sign    Worried About Running Out of Food in the Last Year: Never true    Ran Out of Food in the Last Year: Never true  Transportation Needs: [name] Transportation  Needs (03/17/2024)   PRAPARE - Administrator, Civil Service (Medical): [name]    Lack of Transportation (Non-Medical): [name]  Physical Activity: Inactive (03/17/2024)   Exercise Vital Sign    Days of Exercise per Week: 0 days    Minutes of Exercise per Session: 0 min  Stress: Stress Concern Present (03/17/2024)   [employer] of Occupational Health - Occupational Stress Questionnaire    Feeling of Stress: To some extent  Social Connections: Moderately Isolated (03/17/2024)   Social Connection and Isolation Panel    Frequency of Communication with Friends and Family: Three times a week    Frequency of Social Gatherings with Friends and Family: Not on file    Attends Religious Services: Never    Database administrator or Organizations: [name]    Attends Engineer, structural: Never    Marital Status: Married    Tobacco Counseling Ready to quit: Not Answered Counseling given: Not Answered    Clinical Intake:  Pre-visit preparation completed: Yes  Pain : [name]/denies pain     BMI - recorded: 31.17 Nutritional Status: BMI > 30  Obese Nutritional Risks: None  Lab Results  Component Value Date   HGBA1C 6.5 12/28/2023   HGBA1C 6.4 03/10/2023   HGBA1C 6.2 02/28/2022     How often do you need to have someone help you when you read instructions, pamphlets, or other written materials from your doctor or pharmacy?: 1 - Never  Interpreter Needed?: [name]  Information entered by :: Faustino Luecke, RMA   Activities of Daily Living     03/17/2024    7:58 AM  In your present state of health, do you have any difficulty performing the following activities:  Hearing? 1  Comment rt ear hearing loss  Vision? 0  Difficulty concentrating or making decisions? 0  Walking or climbing stairs? 0  Dressing or bathing? 0  Doing errands, shopping? 0  Preparing Food and eating ? N  Using the Toilet? N  In the past six months, have you accidently leaked urine? N  Do you have  problems with loss of bowel control? N  Managing your Medications? N  Managing your Finances? N  Housekeeping or managing your Housekeeping? N    Patient Care Team: Rollene Almarie LABOR, MD as PCP - General (Internal Medicine) Court Dorn PARAS, MD as PCP - Cardiology (Cardiology) Teressa Toribio SQUIBB, MD (Inactive) (Gastroenterology) Williford, Devere POUR., MD (Hematology and Oncology) I have updated your Care Teams any recent Medical Services you may have received from other providers in the past year.     Assessment:   This is a routine wellness examination for Stacey [name].  Hearing/Vision screen Hearing Screening - Comments:: rt ear hearing  loss Vision Screening - Comments:: Denies vision issues.    Goals Addressed               This Visit's Progress     Patient Stated (pt-stated)        To quit smoking-2025       Depression Screen     12/28/2023    2:40 PM 03/10/2023    9:17 AM 03/10/2023    9:15 AM 03/07/2022    8:15 AM 08/27/2021    3:25 PM 06/22/2020    2:40 PM 07/21/2018   10:47 AM  PHQ 2/9 Scores  PHQ - 2 Score 0 0 0 0 0 6 0  PHQ- 9 Score  0 0        Fall Risk     03/17/2024    8:20 AM 12/28/2023    2:40 PM 03/10/2023    9:17 AM 03/10/2023    9:16 AM 03/07/2022    8:15 AM  Fall Risk   Falls in the past year? 0 0 0 0 0  Number falls in past yr: 0 0 0 0   Injury with Fall? 0 0 0 0   Risk for fall due to :  [name] Fall Risks [name] Fall Risks    Follow up Falls evaluation completed;Falls prevention discussed Falls evaluation completed Falls prevention discussed Falls evaluation completed     MEDICARE RISK AT HOME:  Medicare Risk at Home Any stairs in or around the home?: Yes (3 story home) If so, are there any without handrails?: [name] Home free of loose throw rugs in walkways, pet beds, electrical cords, etc?: Yes Adequate lighting in your home to reduce risk of falls?: Yes Life alert?: [name] Use of a cane, walker or w/c?: [name] Grab bars in the bathroom?: Yes Shower chair  or bench in shower?: [name] Elevated toilet seat or a handicapped toilet?: [name]  TIMED UP AND GO:  Was the test performed?  Yes  Length of time to ambulate 10 feet: 15 sec Gait steady and fast without use of assistive device  Cognitive Function: Declined/Normal: [name] cognitive concerns noted by patient or family. Patient alert, oriented, able to answer questions appropriately and recall recent events. [name] signs of memory loss or confusion.        03/10/2023    9:18 AM  6CIT Screen  What Year? 0 points  What month? 0 points  What time? 0 points  Count back from 20 0 points  Months in reverse 0 points  Repeat phrase 0 points  Total Score 0 points    Immunizations Immunization History  Administered Date(s) Administered   Fluad Quad(high Dose 65+) 08/27/2021   Influenza Split 07/21/2018   Influenza,inj,Quad PF,6+ Mos 07/21/2018   PFIZER(Purple Top)SARS-COV-2 Vaccination 12/15/2019, 01/05/2020   Td 09/22/2008   Tdap 09/22/2008    Screening Tests Health Maintenance  Topic Date Due   DTaP/Tdap/Td (3 - Td or Tdap) 09/22/2018   COVID-19 Vaccine (3 - Pfizer risk series) 02/02/2020   INFLUENZA VACCINE  04/22/2024   Lung Cancer Screening  02/03/2025   Medicare Annual Wellness (AWV)  03/17/2025   MAMMOGRAM  02/28/2026   Colonoscopy  04/07/2026   DEXA SCAN  Completed   Hepatitis C Screening  Completed   Hepatitis B Vaccines  Aged Out   HPV VACCINES  Aged Out   Meningococcal B Vaccine  Aged Out   Pneumococcal Vaccine: 50+ Years  Discontinued   Zoster Vaccines- Shingrix  Discontinued    Health Maintenance  Health Maintenance Due  Topic Date Due   DTaP/Tdap/Td (3 - Td or Tdap) 09/22/2018   COVID-19 Vaccine (3 - Pfizer risk series) 02/02/2020   Health Maintenance Items Addressed: See Nurse Notes at the end of this note  Additional Screening:  Vision Screening: Recommended annual ophthalmology exams for early detection of glaucoma and other disorders of the eye. Would you like  a referral to an eye doctor? Yes    Dental Screening: Recommended annual dental exams for proper oral hygiene  Community Resource Referral / Chronic Care Management: CRR required this visit?  [name]   CCM required this visit?  [name]   Plan:    I have personally reviewed and noted the following in the patient's chart:   Medical and social history Use of alcohol, tobacco or illicit drugs  Current medications and supplements including opioid prescriptions. Patient is not currently taking opioid prescriptions. Functional ability and status Nutritional status Physical activity Advanced directives List of other physicians Hospitalizations, surgeries, and ER visits in previous 12 months Vitals Screenings to include cognitive, depression, and falls Referrals and appointments  In addition, I have reviewed and discussed with patient certain preventive protocols, quality metrics, and best practice recommendations. A written personalized care plan for preventive services as well as general preventive health recommendations were provided to patient.   Luana Tatro L Chayne Baumgart, CMA   03/17/2024   After Visit Summary: (MyChart) Due to this being a telephonic visit, the after visit summary with patients personalized plan was offered to patient via MyChart   Notes: Patient stated that she had a recent visit with cardiology for swelling in her lower extremities.  Patient also stated that she had also been SOB, however she is better today and I have noticed there was [name] swelling today.  Patient has scheduled to see Dr. Rollene on 03/29/24, to discuss her recent office visits.  Patient would also like Dr. Rollene to check her B12 levels during her next office visit. She declines all vaccines at this time.

## 2024-03-17 NOTE — Patient Instructions (Signed)
 Ms. Stacey [name] , Thank you for taking time out of your busy schedule to complete your Annual Wellness Visit with me. I enjoyed our conversation and look forward to speaking with you again next year. I, as well as your care team,  appreciate your ongoing commitment to your health goals. Please review the following plan we discussed and let me know if I can assist you in the future. Your Game plan/ To Do List    Follow up Visits: Next Medicare AWV with our clinical staff: Patient will schedule appointment for next year after her CPE in July 2025.   Have you seen your provider in the last 6 months (3 months if uncontrolled diabetes)? Yes Next Office Visit with your provider: 03/29/2024.  Clinician Recommendations:  Aim for 30 minutes of exercise or brisk walking, 6-8 glasses of water, and 5 servings of fruits and vegetables each day. Remember to call and get scheduled for an eye exam soon      This is a list of the screening recommended for you and due dates:  Health Maintenance  Topic Date Due   DTaP/Tdap/Td vaccine (3 - Td or Tdap) 09/22/2018   COVID-19 Vaccine (3 - Pfizer risk series) 02/02/2020   Flu Shot  04/22/2024   Screening for Lung Cancer  02/03/2025   Medicare Annual Wellness Visit  03/17/2025   Mammogram  02/28/2026   Colon Cancer Screening  04/07/2026   DEXA scan (bone density measurement)  Completed   Hepatitis C Screening  Completed   Hepatitis B Vaccine  Aged Out   HPV Vaccine  Aged Out   Meningitis B Vaccine  Aged Out   Pneumococcal Vaccine for age over 30  Discontinued   Zoster (Shingles) Vaccine  Discontinued    Advanced directives: (Copy Requested) Please bring a copy of your health care power of attorney and living will to the office to be added to your chart at your convenience. You can mail to [facility] [address] [place], [place] [address] or email to [email or web address] Advance Care Planning is important because it:  [x]  Makes sure you  receive the medical care that is consistent with your values, goals, and preferences  [x]  It provides guidance to your family and loved ones and reduces their decisional burden about whether or not they are making the right decisions based on your wishes.  Follow the link provided in your after visit summary or read over the paperwork we have mailed to you to help you started getting your Advance Directives in place. If you need assistance in completing these, please reach out to us  so that we can help you!  See attachments for Preventive Care and Fall Prevention Tips.

## 2024-03-29 ENCOUNTER — Encounter: Payer: Self-pay | Admitting: Internal Medicine

## 2024-03-29 ENCOUNTER — Ambulatory Visit (INDEPENDENT_AMBULATORY_CARE_PROVIDER_SITE_OTHER): Admitting: Internal Medicine

## 2024-03-29 VITALS — BP 128/72 | HR 100 | Temp 98.1°F | Ht 68.0 in | Wt 208.0 lb

## 2024-03-29 DIAGNOSIS — R7303 Prediabetes: Secondary | ICD-10-CM | POA: Diagnosis not present

## 2024-03-29 DIAGNOSIS — F419 Anxiety disorder, unspecified: Secondary | ICD-10-CM

## 2024-03-29 DIAGNOSIS — I700 Atherosclerosis of aorta: Secondary | ICD-10-CM

## 2024-03-29 DIAGNOSIS — E559 Vitamin D deficiency, unspecified: Secondary | ICD-10-CM

## 2024-03-29 DIAGNOSIS — M810 Age-related osteoporosis without current pathological fracture: Secondary | ICD-10-CM

## 2024-03-29 DIAGNOSIS — Z720 Tobacco use: Secondary | ICD-10-CM

## 2024-03-29 DIAGNOSIS — Z0000 Encounter for general adult medical examination without abnormal findings: Secondary | ICD-10-CM | POA: Diagnosis not present

## 2024-03-29 DIAGNOSIS — E538 Deficiency of other specified B group vitamins: Secondary | ICD-10-CM

## 2024-03-29 DIAGNOSIS — I272 Pulmonary hypertension, unspecified: Secondary | ICD-10-CM

## 2024-03-29 DIAGNOSIS — J432 Centrilobular emphysema: Secondary | ICD-10-CM

## 2024-03-29 LAB — COMPREHENSIVE METABOLIC PANEL WITH GFR
ALT: 24 U/L (ref 0–35)
AST: 23 U/L (ref 0–37)
Albumin: 4.1 g/dL (ref 3.5–5.2)
Alkaline Phosphatase: 64 U/L (ref 39–117)
BUN: 14 mg/dL (ref 6–23)
CO2: 34 meq/L — ABNORMAL HIGH (ref 19–32)
Calcium: 9.3 mg/dL (ref 8.4–10.5)
Chloride: 98 meq/L (ref 96–112)
Creatinine, Ser: 0.77 mg/dL (ref 0.40–1.20)
GFR: 78.82 mL/min (ref >60.00)
Glucose, Bld: 104 mg/dL — ABNORMAL HIGH (ref 70–99)
Potassium: 4.4 meq/L (ref 3.5–5.1)
Sodium: 141 meq/L (ref 135–145)
Total Bilirubin: 1 mg/dL (ref 0.2–1.2)
Total Protein: 7.1 g/dL (ref 6.0–8.3)

## 2024-03-29 LAB — VITAMIN B12: Vitamin B-12: 382 pg/mL (ref 211–911)

## 2024-03-29 LAB — VITAMIN D 25 HYDROXY (VIT D DEFICIENCY, FRACTURES): VITD: 36.86 ng/mL (ref 30.00–100.00)

## 2024-03-29 NOTE — Progress Notes (Signed)
   Subjective:   Patient ID: Stacey [name], female    DOB: [birth year], 68 y.o.   MRN: [id number]  HPI The patient is here for physical.  PMH, [facility], social history reviewed and updated  Review of Systems  Constitutional: Negative.   HENT: Negative.    Eyes: Negative.   Respiratory:  Negative for cough, chest tightness and shortness of breath.   Cardiovascular:  Negative for chest pain, palpitations and leg swelling.  Gastrointestinal:  Negative for abdominal distention, abdominal pain, constipation, diarrhea, nausea and vomiting.  Musculoskeletal:  Positive for arthralgias.  Skin: Negative.   Neurological: Negative.   Psychiatric/Behavioral:  Positive for dysphoric mood.     Objective:  Physical Exam Constitutional:      Appearance: She is well-developed.  HENT:     Head: Normocephalic and atraumatic.  Cardiovascular:     Rate and Rhythm: Normal rate and regular rhythm.  Pulmonary:     Effort: Pulmonary effort is normal. No respiratory distress.     Breath sounds: Normal breath sounds. No wheezing or rales.  Abdominal:     General: Bowel sounds are normal. There is no distension.     Palpations: Abdomen is soft.     Tenderness: There is no abdominal tenderness. There is no rebound.  Musculoskeletal:     Cervical back: Normal range of motion.  Skin:    General: Skin is warm and dry.  Neurological:     Mental Status: She is alert and oriented to person, place, and time.     Coordination: Coordination normal.     Vitals:   03/29/24 0839  BP: 128/72  Pulse: 100  Temp: 98.1 F (36.7 C)  TempSrc: Oral  SpO2: 91%  Weight: 208 lb (94.3 kg)  Height: 5' 8 (1.727 m)    Assessment & Plan:

## 2024-03-29 NOTE — Assessment & Plan Note (Addendum)
 Dilated pulmonary arteries on recent imaging and seeing pulmonary next week. Some scattered oxygen levels low with activity. Encouraged to stop smoking.

## 2024-04-01 NOTE — Assessment & Plan Note (Signed)
 Counseled to quit smoking.

## 2024-04-01 NOTE — Assessment & Plan Note (Signed)
 Some stressors in life and overall coping okay.

## 2024-04-01 NOTE — Assessment & Plan Note (Signed)
 Checking vitamin B12 level and adjust as needed.

## 2024-04-01 NOTE — Assessment & Plan Note (Signed)
 Reviewed last dexa. Continue calcium and vitamin d .

## 2024-04-01 NOTE — Assessment & Plan Note (Signed)
 Checking vitamin D and adjust as needed.

## 2024-04-01 NOTE — Assessment & Plan Note (Signed)
 Noted on CT scan and mentioned to patient. She does not have SOB on exertion at this time. We discussed urgent need to stop smoking before that develops.

## 2024-04-01 NOTE — Assessment & Plan Note (Signed)
 Taking aspirin  daily and continue.

## 2024-04-01 NOTE — Assessment & Plan Note (Signed)
 Flu shot yearly. Pneumonia complete. Shingrix complete. Tetanus due. Colonoscopy up to date. Mammogram up to date, pap smear aged out and dexa up to date. Counseled about sun safety and mole surveillance. Counseled about the dangers of distracted driving. Given 10 year screening recommendations.

## 2024-04-01 NOTE — Assessment & Plan Note (Signed)
 Checking HGA1c and adjust as needed.

## 2024-04-04 ENCOUNTER — Ambulatory Visit: Payer: Self-pay | Admitting: Internal Medicine

## 2024-04-05 ENCOUNTER — Ambulatory Visit: Admitting: Pulmonary Disease

## 2024-04-05 VITALS — BP 126/77 | HR 105 | Ht 68.0 in | Wt 208.2 lb

## 2024-04-05 DIAGNOSIS — F17210 Nicotine dependence, cigarettes, uncomplicated: Secondary | ICD-10-CM

## 2024-04-05 DIAGNOSIS — G4733 Obstructive sleep apnea (adult) (pediatric): Secondary | ICD-10-CM

## 2024-04-05 NOTE — Progress Notes (Signed)
 Stacey [name]    [id number]    1956/07/20  Primary Care Physician:Crawford, Almarie LABOR, MD  Referring Physician: Alvia Corean CROME, FNP [address] [place],  [place] [address]  Chief complaint:   Patient being seen for sleep disordered breathing  HPI:  Accompanied by spouse Has concerning symptoms for sleep disordered breathing, spouse has sleep apnea and uses CPAP  Active smoker  Sleep is erratic He sleeps almost sitting upright, jerking awake, gasping respirations  Usually goes to bed about 11 PM sometimes takes up to 2 hours to fall asleep sometimes will watch TV shows just to try and get to sleep 3-4 awakenings Final wake up time between 630 and 8 AM  Weight is up about 15 pounds  She is retired  Recently treated for heart failure, had developed leg swelling was treated diuretics, currently on Lasix  and spironolactone   Denies significant shortness of breath at rest Continues to follow-up with cardiology  Denies morning headaches does have night sweats, dryness of her mouth in the morning  Does have a's sibling-Brother with obstructive sleep apnea  Outpatient Encounter Medications as of 04/05/2024  Medication Sig   aspirin EC 81 MG tablet Take 81 mg by mouth daily. Swallow whole.   Calcium-Magnesium-Vitamin D  600-40-500 MG-MG-UNIT TB24 Take by mouth daily.   Cholecalciferol (VITAMIN D -3) 1000 UNITS CAPS Take 250 capsules by mouth daily.   diazepam  (VALIUM ) 5 MG tablet Take 1 tablet (5 mg total) by mouth daily as needed for anxiety.   furosemide  (LASIX ) 40 MG tablet Take 1 tablet (40 mg total) by mouth daily.   Multiple Vitamins-Minerals (CENTRUM SILVER) tablet Take 1 tablet by mouth daily.   spironolactone  (ALDACTONE ) 25 MG tablet Take 1 tablet (25 mg total) by mouth daily.   vitamin B-12 (CYANOCOBALAMIN ) 1000 MCG tablet Take 1,000 mcg by mouth daily.   vitamin C (ASCORBIC ACID) 500 MG tablet Take 1,000 mg by mouth daily.   No  facility-administered encounter medications on file as of 04/05/2024.    Allergies as of 04/05/2024 - Review Complete 04/05/2024  Allergen Reaction Noted   Amoxicillin  09/28/2009   Penicillins  09/28/2009    Past Medical History:  Diagnosis Date   Aortic atherosclerosis (HCC)    Breast cancer (HCC) 09/22/2006   L side, s/p lumpectomy, chemotherapy and radiation   Centrilobular emphysema (HCC)    Edema    Leg swelling    Panic disorder    SOB (shortness of breath)    Tobacco abuse     Past Surgical History:  Procedure Laterality Date   BREAST LUMPECTOMY  2008    Family History  Problem Relation Age of Onset   Diabetes Other    Diabetes Mother    Heart disease Father    Breast cancer Sister    Heart disease Brother    Breast cancer Paternal Aunt    Colon cancer Paternal Aunt    Heart disease Maternal Grandfather    Celiac disease Paternal Grandmother    Heart disease Paternal Grandfather     Social History   Socioeconomic History   Marital status: Married    Spouse name: [name]   Number of children: Not on file   Years of education: Not on file   Highest education level: Not on file  Occupational History   Occupation: RETIRED  Tobacco Use   Smoking status: Every Day    Current packs/day: 1.50    Average packs/day: 1.5  packs/day for 45.0 years (67.5 ttl pk-yrs)    Types: Cigarettes   Smokeless tobacco: Never  Vaping Use   Vaping status: Never Used  Substance and Sexual Activity   Alcohol use: Yes    Alcohol/week: 0.0 standard drinks of alcohol   Drug use: No   Sexual activity: Not on file  Other Topics Concern   Not on file  Social History Narrative   Lives with husband/2025   Social Drivers of Health   Financial Resource Strain: Low Risk  (03/17/2024)   Overall Financial Resource Strain (CARDIA)    Difficulty of Paying Living Expenses: Not very hard  Food Insecurity: No Food Insecurity (03/17/2024)   Hunger Vital Sign    Worried About Running  Out of Food in the Last Year: Never true    Ran Out of Food in the Last Year: Never true  Transportation Needs: No Transportation Needs (03/17/2024)   PRAPARE - Administrator, Civil Service (Medical): No    Lack of Transportation (Non-Medical): No  Physical Activity: Inactive (03/17/2024)   Exercise Vital Sign    Days of Exercise per Week: 0 days    Minutes of Exercise per Session: 0 min  Stress: Stress Concern Present (03/17/2024)   [employer] of Occupational Health - Occupational Stress Questionnaire    Feeling of Stress: To some extent  Social Connections: Moderately Isolated (03/17/2024)   Social Connection and Isolation Panel    Frequency of Communication with Friends and Family: Three times a week    Frequency of Social Gatherings with Friends and Family: Not on file    Attends Religious Services: Never    Active Member of Clubs or Organizations: No    Attends Banker Meetings: Never    Marital Status: Married  Catering manager Violence: Not At Risk (03/17/2024)   Humiliation, Afraid, Rape, and Kick questionnaire    Fear of Current or Ex-Partner: No    Emotionally Abused: No    Physically Abused: No    Sexually Abused: No    Review of Systems  Respiratory:  Positive for shortness of breath.   Psychiatric/Behavioral:  Positive for sleep disturbance.     Vitals:   04/05/24 0952  BP: 126/77  Pulse: (!) 105  SpO2: 91%     Physical Exam Constitutional:      Appearance: She is obese.  HENT:     Head: Normocephalic.     Nose: Nose normal.     Mouth/Throat:     Mouth: Mucous membranes are moist.  Eyes:     General: No scleral icterus.    Pupils: Pupils are equal, round, and reactive to light.  Cardiovascular:     Rate and Rhythm: Normal rate and regular rhythm.     Heart sounds: No murmur heard.    No friction rub.  Pulmonary:     Effort: No respiratory distress.     Breath sounds: No stridor. No wheezing or rhonchi.   Musculoskeletal:     Cervical back: No rigidity or tenderness.  Neurological:     Mental Status: She is alert.  Psychiatric:        Mood and Affect: Mood normal.       04/05/2024    9:00 AM  Results of the Epworth flowsheet  Sitting and reading 0  Watching TV 3  Sitting, inactive in a public place (e.g. a theatre or a meeting) 0  As a passenger in a car for an hour without a break  0  Lying down to rest in the afternoon when circumstances permit 2  Sitting and talking to someone 0  Sitting quietly after a lunch without alcohol 0  In a car, while stopped for a few minutes in traffic 0  Total score 5    Data Reviewed: Echocardiogram 02/22/2024 reviewed with decreased ejection fraction of 45 to 50%, diastolic dysfunction  Assessment:  Decompensated heart failure recently - Continue to follow with cardiology - Optimizing treatment  Concern for sleep disordered breathing -Snoring, witnessed apneas -Daytime sleepiness  Active smoker  Possible underlying obstructive lung disease  Pathophysiology of sleep disordered breathing discussed with the patient Treatment options discussed with the patient  Plan/Recommendations: Schedule patient for an in-lab sleep study with recent decompensated heart failure, possibility of central sleep apnea does exist Sleep is erratic with multiple awakenings at night, she feels she will not be able to complete her study at home and the likelihood of a Pap false negative study is significant with erratic sleep - Would benefit best from an in-lab sleep study  Smoking cessation counseling  Encouraged regular exercises for weight loss  Encouraged regarding sleep hygiene, encouraged to continue building a routine for better sleep  Encouraged to call with significant concerns  Risk of not treating sleep disordered breathing discussed with patient   Jennet Epley MD [facility] 04/05/2024, 10:08 AM  CC: Alvia Corean CROME, *

## 2024-04-05 NOTE — Patient Instructions (Signed)
 Schedule you for an in lab sleep study  Continue to focus on optimizing your sleep as best as you can  Regular exercises  Continue to work on building a routine for better sleep  Continue with cutting down smoking and quitting  Follow-up in about 3 months  Call us  with significant concerns

## 2024-04-06 ENCOUNTER — Encounter: Payer: Self-pay | Admitting: Pulmonary Disease

## 2024-04-19 NOTE — Telephone Encounter (Signed)
 SABRA

## 2024-05-19 ENCOUNTER — Ambulatory Visit (HOSPITAL_BASED_OUTPATIENT_CLINIC_OR_DEPARTMENT_OTHER): Attending: Pulmonary Disease | Admitting: Pulmonary Disease

## 2024-05-19 DIAGNOSIS — G4700 Insomnia, unspecified: Secondary | ICD-10-CM | POA: Diagnosis not present

## 2024-05-19 DIAGNOSIS — G4733 Obstructive sleep apnea (adult) (pediatric): Secondary | ICD-10-CM | POA: Diagnosis not present

## 2024-05-25 ENCOUNTER — Telehealth: Payer: Self-pay | Admitting: Pulmonary Disease

## 2024-05-25 NOTE — Telephone Encounter (Signed)
 Informed patient scheduled appointment.NFN

## 2024-05-25 NOTE — Telephone Encounter (Signed)
 Call patient  Sleep study result  Date of study: 05/19/2024  Impression: Moderate obstructive sleep apnea with moderately severe oxygen desaturations AHI of 24.3, required oxygen supplementation at 1 L Study was limited by insomnia, sleep fragmentation  Recommendation:  Follow-up to discuss study results  Because of significant desaturations, a titration study is required for the initiation of CPAP therapy with oxygen titrated during the study  Further evaluation and management of insomnia  Encourage weight loss measures  Behavioral modifications to optimize sleep quality

## 2024-05-25 NOTE — Procedures (Signed)
  Indications for Polysomnography The patient is a 68 year-old Female who is 5' 8 and weighs 208.0 lbs. Her BMI equals 31.9.  A full night polysomnogram was performed to evaluate for -.  MedicationDiazepam Polysomnogram Data A full night polysomnogram recorded the standard physiologic parameters including EEG, EOG, EMG, EKG, nasal and oral airflow.  Respiratory parameters of chest and abdominal movements were recorded with Respiratory Inductance Plethysmography belts.   Oxygen saturation was recorded by pulse oximetry.  Sleep Architecture The total recording time of the polysomnogram was 411.8 minutes.  The total sleep time was 136.0 minutes.  The patient spent 35.3% of total sleep time in Stage N1, 32.4% in Stage N2, 32.4% in Stages N3, and 0.0% in REM.  Sleep latency was 48.2 minutes.   REM latency was - minutes.  Sleep Efficiency was 33.0%.  Wake after Sleep Onset time was 227.5 minutes.  Respiratory Events The polysomnogram revealed a presence of - obstructive, - central, and - mixed apneas resulting in an Apnea index of - events per hour.  There were 55 hypopneas (GreaterEqual to3% desaturation and/or arousal) resulting in an Apnea\Hypopnea Index (AHI  GreaterEqual to3% desaturation and/or arousal) of 24.3 events per hour.  There were 10 hypopneas (GreaterEqual to4% desaturation) resulting in an Apnea\Hypopnea Index (AHI GreaterEqual to4% desaturation) of 4.4 events per hour.  There were 7 Respiratory  Effort Related Arousals resulting in a RERA index of 3.1 events per hour. The Respiratory Disturbance Index is 27.4 events per hour.  The snore index was 173.4 events per hour.  Mean oxygen saturation was 90.7%.  The lowest oxygen saturation during sleep was 77.0%.  Time spent LessEqual to88% oxygen saturation was  minutes ().  Limb Activity There were 16 total limb movements recorded, of this total, 5 were classified as PLMs.  PLM index was 2.2 per hour and PLM associated with Arousals index  was - per hour.  Cardiac Summary The average pulse rate was 71.4 bpm.  The minimum pulse rate was 55.0 bpm while the maximum pulse rate was 102.0 bpm.  Cardiac rhythm was normal/abnormal.  Comments:  Diagnosis: Moderate obstructive sleep apnea with moderately severe oxygen desaturations AHI of 24.3, O2 nadir of 77%, oxygen was below 88% for 65 minutes Required oxygen at 1 L to be added to the system.  Study was limited by insomnia Sleep onset and sleep maintenance insomnia poor sleep efficiency of 33%, REM sleep was not achieved Persistent sleep fragmentation  No significant periodic limb movement  Cardiac rhythm was sinus  ResMed F30i medium size fullface mask was used for the study  Recommendations: Schedule patient for follow-up visit to discuss findings on the sleep study which was significant for moderate obstructive sleep apnea with persistent oxygen desaturations Study was limited by insomnia which should be discussed and addressed with initiation of treatment Patient will need CPAP titration with oxygen supplementation confirmed during the study to have this covered-May be difficult to achieve with noted poor sleep efficiency  Recommend close clinical follow-up for initiation and maintenance of treatment for moderate obstructive sleep apnea  This study was personally reviewed and electronically signed by: Jennet Epley, MD Accredited Board Certified in Sleep Medicine Date/Time: 05/25/24

## 2024-05-25 NOTE — Procedures (Signed)
 [facility] [address] [place], [place] [address] Tel: [phone]   Fax: [phone]  Polysomnography Interpretation  Patient Name:  Stacey [name], Stacey [name] Date:  05/19/2024 Referring Physician:  JENNET EPLEY [phone]) %%startinterp%% Indications for Polysomnography The patient is a 69 year-old Female who is 5' 8 and weighs 208.0 lbs. Her BMI equals 31.9.  A full night polysomnogram was performed to evaluate for -.  Medication  Diazepam    Polysomnogram Data A full night polysomnogram recorded the standard physiologic parameters including EEG, EOG, EMG, EKG, nasal and oral airflow.  Respiratory parameters of chest and abdominal movements were recorded with Respiratory Inductance Plethysmography belts.  Oxygen saturation was recorded by pulse oximetry.   Sleep Architecture The total recording time of the polysomnogram was 411.8 minutes.  The total sleep time was 136.0 minutes.  The patient spent 35.3% of total sleep time in Stage N1, 32.4% in Stage N2, 32.4% in Stages N3, and 0.0% in REM.  Sleep latency was 48.2 minutes.  REM latency was - minutes.  Sleep Efficiency was 33.0%.  Wake after Sleep Onset time was 227.5 minutes.  Respiratory Events The polysomnogram revealed a presence of - obstructive, - central, and - mixed apneas resulting in an Apnea index of - events per hour.  There were 55 hypopneas (>=3% desaturation and/or arousal) resulting in an Apnea\Hypopnea Index (AHI >=3% desaturation and/or arousal) of 24.3 events per hour.  There were 10 hypopneas (>=4% desaturation) resulting in an Apnea\Hypopnea Index (AHI >=4% desaturation) of 4.4 events per hour.  There were 7 Respiratory Effort Related Arousals resulting in a RERA index of 3.1 events per hour. The Respiratory Disturbance Index is 27.4 events per hour.  The snore index was 173.4 events per hour.  Mean oxygen saturation was 90.7%.  The lowest oxygen saturation during sleep was 77.0%.   Time spent <=88% oxygen saturation was 64.7 minutes (16.0%).  Limb Activity There were 16 total limb movements recorded, of this total, 5 were classified as PLMs.  PLM index was 2.2 per hour and PLM associated with Arousals index was - per hour.  Cardiac Summary The average pulse rate was 71.4 bpm.  The minimum pulse rate was 55.0 bpm while the maximum pulse rate was 102.0 bpm.  Cardiac rhythm was normal/abnormal.  Comments:   Diagnosis:  Moderate obstructive sleep apnea with moderately severe oxygen desaturations AHI of 24.3, O2 nadir of 77%, oxygen was below 88% for 65 minutes Required oxygen at 1 L to be added to the system.  Study was limited by insomnia Sleep onset and sleep maintenance insomnia poor sleep efficiency of 33%, REM sleep was not achieved Persistent sleep fragmentation  No significant periodic limb movement  Cardiac rhythm was sinus  ResMed F30i medium size fullface mask was used for the study  Recommendations: Schedule patient for follow-up visit to discuss findings on the sleep study which was significant for moderate obstructive sleep apnea with persistent oxygen desaturations Study was limited by insomnia which should be discussed and addressed with initiation of treatment Patient will need CPAP titration with oxygen supplementation confirmed during the study to have this covered-May be difficult to achieve with noted poor sleep efficiency  Recommend close clinical follow-up for initiation and maintenance of treatment for moderate obstructive sleep apnea  This study was personally reviewed and electronically signed by: JENNET EPLEY, MD Accredited Board Certified in Sleep Medicine Date/Time:  05/25/24   %%endinterp%%   Diagnostic PSG Report  Patient Name: Stacey [name], Stacey [name] Study Date:  05/19/2024  Date of Birth: March 09, 1956 Study Type: Diagnostic  Age: 55 year MRN #: [id number]  Sex: Female Interpreting Physician: NEDA HAMMOND, [id number]  Height: 5' 8  Referring Physician: HAMMOND NEDA [id number])  Weight: 208.0 lbs Recording Tech: Holly Neeriemer RPSGT RST  BMI: 31.9 Scoring Tech: Holly Neeriemer RPSGT RST  ESS: 2 Neck Size: 15   Study Overview  Lights Off: 10:48:43 PM  Count Index  Lights On: 05:40:28 AM Awakenings: 69 30.4  Time in Bed: 411.8 min. Arousals: 93 41.0  Total Sleep Time: 136.0 min. AHI (>=3% Desat and/or Ar.): 55 24.3   Sleep Efficiency: 33.0% AHI (>=4% Desat): 10 4.4   Sleep Latency: 48.2 min. Limb Movements: 16 7.1  Wake After Sleep Onset: 227.5 min. Snore: 393 173.4  REM Latency from Sleep Onset: - min. Desaturations: 60 26.5     Minimum SpO2 TST: 77.0%    Sleep Architecture  % of Time in Bed Stages Time (mins) % Sleep Time  Wake 276.5   Stage N1 48.0 35.3%  Stage N2 44.0 32.4%  Stage N3 44.0 32.4%  REM 0.0 0.0%   Arousal Summary   NREM REM Sleep Index  Respiratory Arousals 38 - 38 16.8  PLM Arousals - - - -  Isolated Limb Movement Arousals 4 - 4 1.8  Snore Arousals 8 - 8 3.5  Spontaneous Arousals 44 - 44 19.4  Total 93 - 93 41.0   Limb Movement Summary   Count Index  Isolated Limb Movements 11 4.9  Periodic Limb Movements (PLMs) 5 2.2  Total Limb Movements 16 7.1    Respiratory Summary   By Sleep Stage By Body Position Total   NREM REM Supine Non-Supine   Time (min) 136.0 0.0 - 136.0 136.0         Obstructive Apnea - - - - -  Mixed Apnea - - - - -  Central Apnea - - - - -  Total Apneas - - - - -  Total Apnea Index - - - - -         Hypopneas (>=3% Desat and/or Ar.) 55 - - 55 55  AHI (>=3% Desat and/or Ar.) 24.3 - - 24.3 24.3         Hypopneas (>=4% Desat) 10 - - 10 10  AHI (>=4% Desat) 4.4 - - 4.4 4.4          RERAs 7 - - 7 7  RERA Index 3.1 - - 3.1 3.1         RDI 27.4 - - 27.4 27.4    Respiratory Event Type Index  Central Apneas -  Obstructive Apneas -  Mixed Apneas -  Central Hypopneas -  Obstructive Hypopneas 24.3  Central Apnea + Hypopnea (CAHI) -  Obstructive Apnea  + Hypopnea (OAHI) 24.3   Respiratory Event Durations   Apnea Hypopnea   NREM REM NREM REM  Average (seconds) - - 23.6 -  Maximum (seconds) - - 41.0 -    Oxygen Saturation Summary   Wake NREM REM TST TIB  Average SpO2 (%) 90.6% 90.9% - 90.9% 90.7%  Minimum SpO2 (%) 77.0% 77.0% - 77.0% 77.0%  Maximum SpO2 (%) 98.0% 95.0% - 95.0% 98.0%   Oxygen Saturation Distribution  Range (%) Time in range (min) Time in range (%)  90.0 - 100.0 269.2 66.8%  80.0 - 90.0 132.0 32.7%  70.0 - 80.0 1.8 0.5%  60.0 - 70.0 - -  50.0 - 60.0 - -  0.0 - 50.0 - -  Time Spent <=88% SpO2  Range (%) Time in range (min) Time in range (%)  0.0 - 88.0 64.7 16.0%      Count Index  Desaturations 60 26.5    Cardiac Summary   Wake NREM REM Sleep Total  Average Pulse Rate (BPM) 73.9 66.3 - 66.3 71.4  Minimum Pulse Rate (BPM) 56.0 55.0 - 55.0 55.0  Maximum Pulse Rate (BPM) 102.0 89.0 - 89.0 102.0   Pulse Rate Distribution:  Range (bpm) Time in range (min) Time in range (%)  0.0 - 40.0 - -  40.0 - 60.0 41.7 10.3%  60.0 - 80.0 292.1 72.3%  80.0 - 100.0 69.9 17.3%  100.0 - 120.0 0.1 0.0%  120.0 - 140.0 - -  140.0 - 200.0 - -      Hypnograms                      Technologist Comments  The patient is a 68 y/o female in the [facility] for a Split-Night study. Associated diagnosis of OSA. The patient arrived at the lab accompanied by her husband. She was very anxious about the study. The patient had a brief panic attack and some anxiety for the first portion of the night but was able to calm down and proceed with the study. The study was performed in Sleep Room #2.   The patient was fitted for a mask prior to wire application.  The ResMed F30i Medium under the nose full face mask was best fit and seal.  The patient was comfortable with this mask.   She was also fitted for a nasal mask but she could not breathe comfortably with mouth closed and did not like it. For reference-- this mask  was a ResMed N20 nasal mask, Medium.   The patient self administered 2.5 MG Diazepam  at 2230 just before study started.  She reported feeling wide awake and hoped it would help her sleep.   The patient displayed low SpO2 saturations after lying down and study started. She met the protocol for supplemental O2 application and was placed on 1.0 LPM at 2349. The patient had very little sleep until approx 0430 when she achieved steady sleep stages NREM 2 and 3. Prior to this, her sleep displayed majority wake and WASO. She did display hypopneic events as she achieved sleep which appeared to contribute to and continue the fragmentation of sleep stages.  Snoring was mainly mild and intermittent with occasional snorts causing arousal.  Restroom visits: 1 ECG: Sinus Ryhthm  PLMs/PLMAs were rare No REM sleep was observed during the study.  Supplemental O2 was applied: 1.0 LPM at 2349 The patient did not meet split night criteria due to insufficient sleep time and the study was continued as a baseline.

## 2024-06-02 ENCOUNTER — Ambulatory Visit (INDEPENDENT_AMBULATORY_CARE_PROVIDER_SITE_OTHER): Admitting: Pulmonary Disease

## 2024-06-02 VITALS — BP 113/73 | HR 75 | Ht 68.0 in | Wt 209.0 lb

## 2024-06-02 DIAGNOSIS — J449 Chronic obstructive pulmonary disease, unspecified: Secondary | ICD-10-CM

## 2024-06-02 DIAGNOSIS — G4733 Obstructive sleep apnea (adult) (pediatric): Secondary | ICD-10-CM | POA: Diagnosis not present

## 2024-06-02 DIAGNOSIS — F17210 Nicotine dependence, cigarettes, uncomplicated: Secondary | ICD-10-CM

## 2024-06-02 NOTE — Progress Notes (Unsigned)
 Stacey [name]    [id number]    1956-06-04  Primary Care Physician:Crawford, Almarie LABOR, MD  Referring Physician: Rollene Almarie LABOR, MD [address] [place],  [place] [address]  Chief complaint:   Patient being seen for sleep disordered breathing  HPI:  Accompanied by spouse Recently had a sleep study performed, suboptimal study and that she did not sleep very well only about 33% efficiency Study did show moderate obstructive sleep apnea, she was placed on oxygen supplementation during the study  Had her come to the office today to discuss findings and then further evaluation and management I believe she will need a titration study to ascertain optimal CPAP therapy and to ascertain whether she needs oxygen supplementation  She is an active smoker  Sleep is erratic  She stated that there was construction going on around the time she had a sleep study and she may elect to wait until the sleep lab relocates before having a titration study performed  Sleep habits are not significantly changed  She is retired  Recently treated for heart failure, had developed leg swelling was treated diuretics, currently on Lasix  and spironolactone   Denies significant shortness of breath at rest Continues to follow-up with cardiology  Denies morning headaches does have night sweats, dryness of her mouth in the morning  Outpatient Encounter Medications as of 06/02/2024  Medication Sig   aspirin EC 81 MG tablet Take 81 mg by mouth daily. Swallow whole.   Calcium-Magnesium-Vitamin D  600-40-500 MG-MG-UNIT TB24 Take by mouth daily.   Cholecalciferol (VITAMIN D -3) 1000 UNITS CAPS Take 250 capsules by mouth daily.   diazepam  (VALIUM ) 5 MG tablet Take 1 tablet (5 mg total) by mouth daily as needed for anxiety.   furosemide  (LASIX ) 40 MG tablet Take 1 tablet (40 mg total) by mouth daily.   Multiple Vitamins-Minerals (CENTRUM SILVER) tablet Take 1 tablet by mouth daily.    spironolactone  (ALDACTONE ) 25 MG tablet Take 1 tablet (25 mg total) by mouth daily.   vitamin B-12 (CYANOCOBALAMIN ) 1000 MCG tablet Take 1,000 mcg by mouth daily.   vitamin C (ASCORBIC ACID) 500 MG tablet Take 1,000 mg by mouth daily.   No facility-administered encounter medications on file as of 06/02/2024.    Allergies as of 06/02/2024 - Review Complete 04/05/2024  Allergen Reaction Noted   Amoxicillin  09/28/2009   Penicillins  09/28/2009    Past Medical History:  Diagnosis Date   Aortic atherosclerosis (HCC)    Breast cancer (HCC) 09/22/2006   L side, s/p lumpectomy, chemotherapy and radiation   Centrilobular emphysema (HCC)    Edema    Leg swelling    Panic disorder    SOB (shortness of breath)    Tobacco abuse     Past Surgical History:  Procedure Laterality Date   BREAST LUMPECTOMY  2008    Family History  Problem Relation Age of Onset   Diabetes Other    Diabetes Mother    Heart disease Father    Breast cancer Sister    Heart disease Brother    Breast cancer Paternal Aunt    Colon cancer Paternal Aunt    Heart disease Maternal Grandfather    Celiac disease Paternal Grandmother    Heart disease Paternal Grandfather     Social History   Socioeconomic History   Marital status: Married    Spouse name: [name]   Number of children: Not on file   Years of education: Not on  file   Highest education level: Not on file  Occupational History   Occupation: RETIRED  Tobacco Use   Smoking status: Every Day    Current packs/day: 1.50    Average packs/day: 1.5 packs/day for 45.0 years (67.5 ttl pk-yrs)    Types: Cigarettes   Smokeless tobacco: Never  Vaping Use   Vaping status: Never Used  Substance and Sexual Activity   Alcohol use: Yes    Alcohol/week: 0.0 standard drinks of alcohol   Drug use: No   Sexual activity: Not on file  Other Topics Concern   Not on file  Social History Narrative   Lives with husband/2025   Social Drivers of Health    Financial Resource Strain: Low Risk  (03/17/2024)   Overall Financial Resource Strain (CARDIA)    Difficulty of Paying Living Expenses: Not very hard  Food Insecurity: No Food Insecurity (03/17/2024)   Hunger Vital Sign    Worried About Running Out of Food in the Last Year: Never true    Ran Out of Food in the Last Year: Never true  Transportation Needs: No Transportation Needs (03/17/2024)   PRAPARE - Administrator, Civil Service (Medical): No    Lack of Transportation (Non-Medical): No  Physical Activity: Inactive (03/17/2024)   Exercise Vital Sign    Days of Exercise per Week: 0 days    Minutes of Exercise per Session: 0 min  Stress: Stress Concern Present (03/17/2024)   [employer] of Occupational Health - Occupational Stress Questionnaire    Feeling of Stress: To some extent  Social Connections: Moderately Isolated (03/17/2024)   Social Connection and Isolation Panel    Frequency of Communication with Friends and Family: Three times a week    Frequency of Social Gatherings with Friends and Family: Not on file    Attends Religious Services: Never    Active Member of Clubs or Organizations: No    Attends Banker Meetings: Never    Marital Status: Married  Catering manager Violence: Not At Risk (03/17/2024)   Humiliation, Afraid, Rape, and Kick questionnaire    Fear of Current or Ex-Partner: No    Emotionally Abused: No    Physically Abused: No    Sexually Abused: No    Review of Systems  Respiratory:  Positive for shortness of breath.   Psychiatric/Behavioral:  Positive for sleep disturbance.     Vitals:   06/02/24 1322  BP: 113/73  Pulse: 75  SpO2: 92%     Physical Exam Constitutional:      Appearance: She is obese.  HENT:     Head: Normocephalic.     Nose: Nose normal.     Mouth/Throat:     Mouth: Mucous membranes are moist.  Eyes:     General: No scleral icterus.    Pupils: Pupils are equal, round, and reactive to light.   Cardiovascular:     Rate and Rhythm: Normal rate and regular rhythm.     Heart sounds: No murmur heard.    No friction rub.  Pulmonary:     Effort: No respiratory distress.     Breath sounds: No stridor. No wheezing or rhonchi.  Musculoskeletal:     Cervical back: No rigidity or tenderness.  Neurological:     Mental Status: She is alert.  Psychiatric:        Mood and Affect: Mood normal.       04/05/2024    9:00 AM  Results of the Epworth flowsheet  Sitting and reading 0  Watching TV 3  Sitting, inactive in a public place (e.g. a theatre or a meeting) 0  As a passenger in a car for an hour without a break 0  Lying down to rest in the afternoon when circumstances permit 2  Sitting and talking to someone 0  Sitting quietly after a lunch without alcohol 0  In a car, while stopped for a few minutes in traffic 0  Total score 5    Data Reviewed: Echocardiogram 02/22/2024 reviewed with decreased ejection fraction of 45 to 50%, diastolic dysfunction  Assessment:  Moderate obstructive sleep apnea -Discussed findings on recent sleep study with patient - Will require a titration study  History of decompensated heart failure Follows with cardiology  Active smoker  Possible underlying obstructive lung disease  Plan/Recommendations:  Schedule for titration study  Schedule for pulmonary function test  Insomnia, fragmentation was felt to be a lot worse during study Patient complained that there was some construction work being done close to the sleep lab  She feels she may want to wait until the sleep lab is relocated which will not happen for another few months     chedule patient for an in-lab sleep study with recent decompensated heart failure, possibility of central sleep apnea does exist Sleep is erratic with multiple awakenings at night, she feels she will not be able to complete her study at home and the likelihood of a Pap false negative study is significant with  erratic sleep - Would benefit best from an in-lab sleep study  Smoking cessation counseling  Encouraged regular exercises for weight loss  Encouraged regarding sleep hygiene, encouraged to continue building a routine for better sleep  Encouraged to call with significant concerns  Risk of not treating sleep disordered breathing discussed with patient   Jennet Epley MD [facility] 06/02/2024, 1:29 PM  CC: Rollene Almarie LABOR, *

## 2024-06-02 NOTE — Patient Instructions (Signed)
 I will see you in about 3 months That appointment can be changed if you have not had your sleep study done by them  Schedule for titration study -Moderate obstructive sleep apnea with oxygen desaturations  Schedule for a breathing study, this can be done on the day you come back in  Call us  with significant concerns

## 2024-06-06 ENCOUNTER — Ambulatory Visit: Admitting: Cardiovascular Disease

## 2024-06-14 ENCOUNTER — Ambulatory Visit: Admitting: Cardiovascular Disease

## 2024-06-30 ENCOUNTER — Ambulatory Visit: Payer: Self-pay

## 2024-06-30 DIAGNOSIS — T148XXA Other injury of unspecified body region, initial encounter: Secondary | ICD-10-CM | POA: Diagnosis not present

## 2024-06-30 DIAGNOSIS — S99922A Unspecified injury of left foot, initial encounter: Secondary | ICD-10-CM | POA: Diagnosis not present

## 2024-06-30 NOTE — Telephone Encounter (Signed)
 FYI Only or Action Required?: FYI only for provider.  Patient was last seen in primary care on 03/29/2024 by Rollene Almarie LABOR, MD.  Called Nurse Triage reporting Foot Injury.  Symptoms began yesterday.  Interventions attempted: Ice/heat application.  Symptoms are: left foot injury with severe pain (patient in tears), unable to bear weight, swelling to left foot, visible deformity gradually worsening.  Triage Disposition: Call EMS 911 Now  Patient/caregiver understands and will follow disposition?: Unsure          Copied from CRM #[id number]. Topic: Clinical - Red Word Triage >> Jun 30, 2024  9:19 AM Pinkey ORN wrote: Red Word that prompted transfer to Nurse Triage: Extreme Pain >> Jun 30, 2024  9:20 AM Pinkey ORN wrote: Patient states has broken her left foot  Reason for Disposition  Looks like a dislocated joint (very crooked or deformed)  Answer Assessment - Initial Assessment Questions Unable to complete triage due to husband interrupted RN asking for appointment. He states patient has obviously deformed foot, unable to bear weight, severe pain and has tears rolling down her face. RN advised ED and husband began yelling Okay so you're not gonna give me an appointment. Bullshit and hung up on triage RN.  1. MECHANISM: How did the injury happen? (e.g., twisting injury, direct blow)      Patient was stepping off the curb, putting their trash cans out. She twisted the foot.  2. ONSET: When did the injury happen? (e.g., minutes or hours ago)      Last night.  3. LOCATION: Where is the injury located?      Left.  4. APPEARANCE of INJURY: What does the injury look like?      Husband states the foot is pretty swollen up. He states it looks like the bone is popping out/deformed. RN confirmed that the bone is not sticking out or breaking the skin.  5. WEIGHT-BEARING: Can you put weight on that foot? Can you walk (four steps or more)?       No.  6. SIZE: For  cuts, bruises, or swelling, ask: How large is it? (e.g., inches or centimeters;  entire joint)      Half the foot is swollen.  7. PAIN: Is there pain? If Yes, ask: How bad is the pain? What does it keep you from doing? (Scale 0-10; or none, mild, moderate, severe)     9/10. He states he gave her his boot from a previous injury to use. She is not able to walk on it.  8. TETANUS: For any breaks in the skin, ask: When was your last tetanus booster?     N/A.  9. OTHER SYMPTOMS: Do you have any other symptoms?      N/A.  10. PREGNANCY: Is there any chance you are pregnant? When was your last menstrual period?       N/A.  Protocols used: Foot Injury-A-AH

## 2024-07-01 DIAGNOSIS — S92355A Nondisplaced fracture of fifth metatarsal bone, left foot, initial encounter for closed fracture: Secondary | ICD-10-CM | POA: Diagnosis not present

## 2024-07-14 NOTE — Telephone Encounter (Signed)
 Copied from CRM [id number]. Topic: Clinical - Request for Lab/Test Order >> Jul 14, 2024  2:03 PM Stacey [name] wrote: Reason for CRM: Patient called in stating Dr. Alvia want her to have an CT scan done in 3 mos and received call to schedule. But I do not show documentation of 49mo follow up. Please call [phone]

## 2024-07-26 DIAGNOSIS — S92355A Nondisplaced fracture of fifth metatarsal bone, left foot, initial encounter for closed fracture: Secondary | ICD-10-CM | POA: Diagnosis not present

## 2024-07-31 ENCOUNTER — Encounter (HOSPITAL_BASED_OUTPATIENT_CLINIC_OR_DEPARTMENT_OTHER): Admitting: Pulmonary Disease

## 2024-08-22 ENCOUNTER — Encounter: Payer: Self-pay | Admitting: Family Medicine

## 2024-08-28 ENCOUNTER — Emergency Department (HOSPITAL_BASED_OUTPATIENT_CLINIC_OR_DEPARTMENT_OTHER)

## 2024-08-28 ENCOUNTER — Inpatient Hospital Stay (HOSPITAL_BASED_OUTPATIENT_CLINIC_OR_DEPARTMENT_OTHER)
Admission: EM | Admit: 2024-08-28 | Discharge: 2024-09-07 | Disposition: A | Source: Home / Self Care | Attending: Internal Medicine | Admitting: Internal Medicine

## 2024-08-28 ENCOUNTER — Encounter (HOSPITAL_BASED_OUTPATIENT_CLINIC_OR_DEPARTMENT_OTHER): Payer: Self-pay

## 2024-08-28 ENCOUNTER — Other Ambulatory Visit: Payer: Self-pay

## 2024-08-28 DIAGNOSIS — J432 Centrilobular emphysema: Secondary | ICD-10-CM | POA: Diagnosis present

## 2024-08-28 DIAGNOSIS — J441 Chronic obstructive pulmonary disease with (acute) exacerbation: Secondary | ICD-10-CM | POA: Diagnosis present

## 2024-08-28 DIAGNOSIS — R0989 Other specified symptoms and signs involving the circulatory and respiratory systems: Secondary | ICD-10-CM | POA: Diagnosis not present

## 2024-08-28 DIAGNOSIS — J9612 Chronic respiratory failure with hypercapnia: Secondary | ICD-10-CM | POA: Diagnosis present

## 2024-08-28 DIAGNOSIS — R0689 Other abnormalities of breathing: Secondary | ICD-10-CM

## 2024-08-28 DIAGNOSIS — J9312 Secondary spontaneous pneumothorax: Secondary | ICD-10-CM | POA: Diagnosis not present

## 2024-08-28 DIAGNOSIS — I700 Atherosclerosis of aorta: Secondary | ICD-10-CM | POA: Diagnosis present

## 2024-08-28 DIAGNOSIS — J9600 Acute respiratory failure, unspecified whether with hypoxia or hypercapnia: Secondary | ICD-10-CM | POA: Diagnosis present

## 2024-08-28 DIAGNOSIS — I2510 Atherosclerotic heart disease of native coronary artery without angina pectoris: Secondary | ICD-10-CM | POA: Diagnosis not present

## 2024-08-28 DIAGNOSIS — Z1152 Encounter for screening for COVID-19: Secondary | ICD-10-CM | POA: Diagnosis not present

## 2024-08-28 DIAGNOSIS — I509 Heart failure, unspecified: Secondary | ICD-10-CM | POA: Diagnosis not present

## 2024-08-28 DIAGNOSIS — Z923 Personal history of irradiation: Secondary | ICD-10-CM | POA: Diagnosis not present

## 2024-08-28 DIAGNOSIS — J939 Pneumothorax, unspecified: Secondary | ICD-10-CM | POA: Diagnosis not present

## 2024-08-28 DIAGNOSIS — J90 Pleural effusion, not elsewhere classified: Secondary | ICD-10-CM | POA: Diagnosis not present

## 2024-08-28 DIAGNOSIS — E872 Acidosis, unspecified: Secondary | ICD-10-CM | POA: Diagnosis present

## 2024-08-28 DIAGNOSIS — F17210 Nicotine dependence, cigarettes, uncomplicated: Secondary | ICD-10-CM | POA: Diagnosis present

## 2024-08-28 DIAGNOSIS — Z720 Tobacco use: Secondary | ICD-10-CM | POA: Diagnosis present

## 2024-08-28 DIAGNOSIS — D751 Secondary polycythemia: Secondary | ICD-10-CM | POA: Diagnosis present

## 2024-08-28 DIAGNOSIS — J440 Chronic obstructive pulmonary disease with acute lower respiratory infection: Secondary | ICD-10-CM | POA: Diagnosis present

## 2024-08-28 DIAGNOSIS — Z7982 Long term (current) use of aspirin: Secondary | ICD-10-CM | POA: Diagnosis not present

## 2024-08-28 DIAGNOSIS — Z833 Family history of diabetes mellitus: Secondary | ICD-10-CM | POA: Diagnosis not present

## 2024-08-28 DIAGNOSIS — R059 Cough, unspecified: Secondary | ICD-10-CM | POA: Diagnosis not present

## 2024-08-28 DIAGNOSIS — J9601 Acute respiratory failure with hypoxia: Secondary | ICD-10-CM | POA: Diagnosis not present

## 2024-08-28 DIAGNOSIS — Z79899 Other long term (current) drug therapy: Secondary | ICD-10-CM | POA: Diagnosis not present

## 2024-08-28 DIAGNOSIS — G4733 Obstructive sleep apnea (adult) (pediatric): Secondary | ICD-10-CM | POA: Diagnosis not present

## 2024-08-28 DIAGNOSIS — R0902 Hypoxemia: Secondary | ICD-10-CM | POA: Diagnosis not present

## 2024-08-28 DIAGNOSIS — Z880 Allergy status to penicillin: Secondary | ICD-10-CM | POA: Diagnosis not present

## 2024-08-28 DIAGNOSIS — E662 Morbid (severe) obesity with alveolar hypoventilation: Secondary | ICD-10-CM | POA: Diagnosis present

## 2024-08-28 DIAGNOSIS — I5033 Acute on chronic diastolic (congestive) heart failure: Secondary | ICD-10-CM | POA: Diagnosis not present

## 2024-08-28 DIAGNOSIS — J9811 Atelectasis: Secondary | ICD-10-CM | POA: Diagnosis not present

## 2024-08-28 DIAGNOSIS — R918 Other nonspecific abnormal finding of lung field: Secondary | ICD-10-CM | POA: Diagnosis not present

## 2024-08-28 DIAGNOSIS — Z9221 Personal history of antineoplastic chemotherapy: Secondary | ICD-10-CM | POA: Diagnosis not present

## 2024-08-28 DIAGNOSIS — Z853 Personal history of malignant neoplasm of breast: Secondary | ICD-10-CM | POA: Diagnosis not present

## 2024-08-28 DIAGNOSIS — I5043 Acute on chronic combined systolic (congestive) and diastolic (congestive) heart failure: Secondary | ICD-10-CM | POA: Diagnosis present

## 2024-08-28 DIAGNOSIS — Z8249 Family history of ischemic heart disease and other diseases of the circulatory system: Secondary | ICD-10-CM | POA: Diagnosis not present

## 2024-08-28 DIAGNOSIS — R0602 Shortness of breath: Secondary | ICD-10-CM | POA: Diagnosis present

## 2024-08-28 DIAGNOSIS — E875 Hyperkalemia: Secondary | ICD-10-CM | POA: Diagnosis present

## 2024-08-28 DIAGNOSIS — J40 Bronchitis, not specified as acute or chronic: Secondary | ICD-10-CM | POA: Diagnosis not present

## 2024-08-28 DIAGNOSIS — Z6832 Body mass index (BMI) 32.0-32.9, adult: Secondary | ICD-10-CM | POA: Diagnosis not present

## 2024-08-28 DIAGNOSIS — J9622 Acute and chronic respiratory failure with hypercapnia: Secondary | ICD-10-CM | POA: Diagnosis present

## 2024-08-28 DIAGNOSIS — J9621 Acute and chronic respiratory failure with hypoxia: Secondary | ICD-10-CM | POA: Diagnosis present

## 2024-08-28 HISTORY — DX: Obstructive sleep apnea (adult) (pediatric): G47.33

## 2024-08-28 HISTORY — DX: Chronic combined systolic (congestive) and diastolic (congestive) heart failure: I50.42

## 2024-08-28 LAB — CBC WITH DIFFERENTIAL/PLATELET
Abs Immature Granulocytes: 0.03 K/uL (ref 0.00–0.07)
Basophils Absolute: 0 K/uL (ref 0.0–0.1)
Basophils Relative: 0 %
Eosinophils Absolute: 0 K/uL (ref 0.0–0.5)
Eosinophils Relative: 0 %
HCT: 52.6 % — ABNORMAL HIGH (ref 36.0–46.0)
Hemoglobin: 17.1 g/dL — ABNORMAL HIGH (ref 12.0–15.0)
Immature Granulocytes: 0 %
Lymphocytes Relative: 13 %
Lymphs Abs: 1.4 K/uL (ref 0.7–4.0)
MCH: 32.8 pg (ref 26.0–34.0)
MCHC: 32.5 g/dL (ref 30.0–36.0)
MCV: 101 fL — ABNORMAL HIGH (ref 80.0–100.0)
Monocytes Absolute: 1.2 K/uL — ABNORMAL HIGH (ref 0.1–1.0)
Monocytes Relative: 11 %
Neutro Abs: 8.1 K/uL — ABNORMAL HIGH (ref 1.7–7.7)
Neutrophils Relative %: 76 %
Platelets: 162 K/uL (ref 150–400)
RBC: 5.21 MIL/uL — ABNORMAL HIGH (ref 3.87–5.11)
RDW: 13.7 % (ref 11.5–15.5)
WBC: 10.9 K/uL — ABNORMAL HIGH (ref 4.0–10.5)
nRBC: 0 % (ref 0.0–0.2)

## 2024-08-28 LAB — COMPREHENSIVE METABOLIC PANEL WITH GFR
ALT: 27 U/L (ref 0–44)
AST: 30 U/L (ref 15–41)
Albumin: 4.2 g/dL (ref 3.5–5.0)
Alkaline Phosphatase: 91 U/L (ref 38–126)
Anion gap: 10 (ref 5–15)
BUN: 12 mg/dL (ref 8–23)
CO2: 32 mmol/L (ref 22–32)
Calcium: 8.9 mg/dL (ref 8.9–10.3)
Chloride: 96 mmol/L — ABNORMAL LOW (ref 98–111)
Creatinine, Ser: 0.79 mg/dL (ref 0.44–1.00)
GFR, Estimated: >60 mL/min (ref >60)
Glucose, Bld: 180 mg/dL — ABNORMAL HIGH (ref 70–99)
Potassium: 4 mmol/L (ref 3.5–5.1)
Sodium: 138 mmol/L (ref 135–145)
Total Bilirubin: 0.6 mg/dL (ref 0.0–1.2)
Total Protein: 7.1 g/dL (ref 6.5–8.1)

## 2024-08-28 LAB — I-STAT VENOUS BLOOD GAS, ED
Acid-Base Excess: 7 mmol/L — ABNORMAL HIGH (ref 0.0–2.0)
Acid-Base Excess: 7 mmol/L — ABNORMAL HIGH (ref 0.0–2.0)
Bicarbonate: 36.4 mmol/L — ABNORMAL HIGH (ref 20.0–28.0)
Bicarbonate: 36.6 mmol/L — ABNORMAL HIGH (ref 20.0–28.0)
Calcium, Ion: 1.08 mmol/L — ABNORMAL LOW (ref 1.15–1.40)
Calcium, Ion: 0.98 mmol/L — ABNORMAL LOW (ref 1.15–1.40)
HCT: 54 % — ABNORMAL HIGH (ref 36.0–46.0)
HCT: 52 % — ABNORMAL HIGH (ref 36.0–46.0)
Hemoglobin: 18.4 g/dL — ABNORMAL HIGH (ref 12.0–15.0)
Hemoglobin: 17.7 g/dL — ABNORMAL HIGH (ref 12.0–15.0)
O2 Saturation: 64 %
O2 Saturation: 74 %
Patient temperature: 98
Patient temperature: 99
Potassium: 4.4 mmol/L (ref 3.5–5.1)
Potassium: 4.2 mmol/L (ref 3.5–5.1)
Sodium: 137 mmol/L (ref 135–145)
Sodium: 139 mmol/L (ref 135–145)
TCO2: 39 mmol/L — ABNORMAL HIGH (ref 22–32)
TCO2: 39 mmol/L — ABNORMAL HIGH (ref 22–32)
pCO2, Ven: 69.3 mmHg — ABNORMAL HIGH (ref 44–60)
pCO2, Ven: 73.1 mmHg (ref 44–60)
pH, Ven: 7.327 (ref 7.25–7.43)
pH, Ven: 7.309 (ref 7.25–7.43)
pO2, Ven: 37 mmHg (ref 32–45)
pO2, Ven: 46 mmHg — ABNORMAL HIGH (ref 32–45)

## 2024-08-28 LAB — TROPONIN T, HIGH SENSITIVITY
Troponin T High Sensitivity: <15 ng/L (ref 0–19)
Troponin T High Sensitivity: 18 ng/L (ref 0–19)

## 2024-08-28 LAB — URINALYSIS, ROUTINE W REFLEX MICROSCOPIC
Bilirubin Urine: NEGATIVE
Glucose, UA: NEGATIVE mg/dL
Ketones, ur: NEGATIVE mg/dL
Leukocytes,Ua: NEGATIVE
Nitrite: NEGATIVE
Protein, ur: >=300 mg/dL — AB
Specific Gravity, Urine: 1.015 (ref 1.005–1.030)
pH: 5.5 (ref 5.0–8.0)

## 2024-08-28 LAB — MAGNESIUM: Magnesium: 1.7 mg/dL (ref 1.7–2.4)

## 2024-08-28 LAB — RESP PANEL BY RT-PCR (RSV, FLU A&B, COVID)  RVPGX2
Influenza A by PCR: NEGATIVE
Influenza B by PCR: NEGATIVE
Resp Syncytial Virus by PCR: NEGATIVE
SARS Coronavirus 2 by RT PCR: NEGATIVE

## 2024-08-28 LAB — URINALYSIS, MICROSCOPIC (REFLEX)

## 2024-08-28 LAB — LACTIC ACID, PLASMA
Lactic Acid, Venous: 2.2 mmol/L (ref 0.5–1.9)
Lactic Acid, Venous: 1.1 mmol/L (ref 0.5–1.9)

## 2024-08-28 LAB — PRO BRAIN NATRIURETIC PEPTIDE: Pro Brain Natriuretic Peptide: 187 pg/mL (ref <300.0)

## 2024-08-28 MED ORDER — SODIUM CHLORIDE 0.9 % IV SOLN
500.0000 mg | Freq: Once | INTRAVENOUS | Status: AC
  Administered 2024-08-28: 500 mg via INTRAVENOUS
  Filled 2024-08-28: qty 5

## 2024-08-28 MED ORDER — IPRATROPIUM-ALBUTEROL 0.5-2.5 (3) MG/3ML IN SOLN
3.0000 mL | Freq: Once | RESPIRATORY_TRACT | Status: AC
  Filled 2024-08-28: qty 3

## 2024-08-28 MED ORDER — IPRATROPIUM-ALBUTEROL 0.5-2.5 (3) MG/3ML IN SOLN
3.0000 mL | Freq: Once | RESPIRATORY_TRACT | Status: AC
  Administered 2024-08-28: 3 mL via RESPIRATORY_TRACT

## 2024-08-28 MED ORDER — METHYLPREDNISOLONE NA SUC (PF) 125 MG IJ SOLR
125.0000 mg | Freq: Once | INTRAMUSCULAR | Status: DC
  Filled 2024-08-28: qty 2

## 2024-08-28 MED ORDER — IPRATROPIUM-ALBUTEROL 0.5-2.5 (3) MG/3ML IN SOLN
3.0000 mL | Freq: Once | RESPIRATORY_TRACT | Status: AC
  Administered 2024-08-28: 3 mL via RESPIRATORY_TRACT
  Filled 2024-08-28: qty 3

## 2024-08-28 MED ORDER — IPRATROPIUM-ALBUTEROL 0.5-2.5 (3) MG/3ML IN SOLN
RESPIRATORY_TRACT | Status: AC
  Administered 2024-08-28: 3 mL via RESPIRATORY_TRACT
  Filled 2024-08-28: qty 3

## 2024-08-28 MED ORDER — METHYLPREDNISOLONE SODIUM SUCC 125 MG IJ SOLR
125.0000 mg | Freq: Once | INTRAMUSCULAR | Status: AC
  Administered 2024-08-28: 125 mg via INTRAVENOUS
  Filled 2024-08-28: qty 2

## 2024-08-28 MED ORDER — FUROSEMIDE 10 MG/ML IJ SOLN
60.0000 mg | Freq: Once | INTRAMUSCULAR | Status: AC
  Administered 2024-08-28: 60 mg via INTRAVENOUS
  Filled 2024-08-28: qty 6

## 2024-08-28 NOTE — ED Notes (Signed)
 Pt placed on Pure [employer]

## 2024-08-28 NOTE — ED Notes (Signed)
 [facility] called for transport.

## 2024-08-28 NOTE — ED Notes (Signed)
 ED Provider at bedside.

## 2024-08-28 NOTE — ED Triage Notes (Addendum)
 Cough, congestion for 3 days. Worsened last night.  Shortness of breath   Lips appear blue, skin pale in triage   RRT called to room  EDP in ED room

## 2024-08-28 NOTE — Progress Notes (Addendum)
 Placed patient on BIPAP from the results of the VBG. She is on PS 7 PEEP 5 35% FIO2.  She is tolerating at this time. Her SPO2 is 96% HR 107 RR 23. RT to monitor and adjust as needed.

## 2024-08-28 NOTE — ED Provider Notes (Signed)
 [place] [facility] Provider Note   CSN: [id number] Arrival date & time: 08/28/24  1611     Patient presents with: Shortness of Breath   Stacey [name] is a 68 y.o. female.   HPI Patient reports he started having a lot of coughing yesterday.  She reports she felt like she had a lot of mucus production and could not lie flat because there was so much mucus.  Patient denies fever.  No chest pain.  She became much more short of breath overnight and today.  Patient denies lower extremity swelling or calf pain.  She reports that she does take Lasix  and spironolactone  regularly which were prescribed by Dr. Court in April.  Patient is a daily smoker of tobacco but denies history of O2 dependency, inhaler use or specific diagnosis of COPD.    Prior to Admission medications   Medication Sig Start Date End Date Taking? Authorizing Provider  aspirin EC 81 MG tablet Take 81 mg by mouth daily. Swallow whole.    [provider]  Calcium-Magnesium -Vitamin D  600-40-500 MG-MG-UNIT TB24 Take by mouth daily.    [provider]  Cholecalciferol (VITAMIN D -3) 1000 UNITS CAPS Take 250 capsules by mouth daily.    [provider]  diazepam  (VALIUM ) 5 MG tablet Take 1 tablet (5 mg total) by mouth daily as needed for anxiety. 12/31/20   Rollene Almarie LABOR, MD  furosemide  (LASIX ) 40 MG tablet Take 1 tablet (40 mg total) by mouth daily. 01/20/24   Court Dorn PARAS, MD  Multiple Vitamins-Minerals (CENTRUM SILVER) tablet Take 1 tablet by mouth daily.    [provider]  spironolactone  (ALDACTONE ) 25 MG tablet Take 1 tablet (25 mg total) by mouth daily. 03/03/24   Goodrich, Callie E, PA-C  vitamin B-12 (CYANOCOBALAMIN ) 1000 MCG tablet Take 1,000 mcg by mouth daily.    [provider]  vitamin C (ASCORBIC ACID) 500 MG tablet Take 1,000 mg by mouth daily.    [provider]    Allergies: Amoxicillin and Penicillins    Review  of Systems  Updated Vital Signs BP 134/78   Pulse 98   Resp (!) 21   SpO2 92%   Physical Exam Constitutional:      Comments: Patient is alert.  Tachypneic.  Pale in appearance but answering questions with full sentences.  HENT:     Head: Normocephalic and atraumatic.     Mouth/Throat:     Pharynx: Oropharynx is clear.  Eyes:     Extraocular Movements: Extraocular movements intact.  Cardiovascular:     Rate and Rhythm: Regular rhythm. Tachycardia present.  Pulmonary:     Comments: Moderate increased work of breathing with tachypnea.  Speaking in full short sentences.  Very soft breath sounds from mid lung field to bases.  Some coarse expiratory wheeze at mid lung field Abdominal:     General: There is no distension.     Palpations: Abdomen is soft.     Tenderness: There is no abdominal tenderness. There is no guarding.  Musculoskeletal:        General: No swelling or tenderness. Normal range of motion.  Skin:    General: Skin is warm and dry.  Neurological:     General: No focal deficit present.     Mental Status: She is oriented to person, place, and time.     (all labs ordered are listed, but only abnormal results are displayed) Labs Reviewed  COMPREHENSIVE METABOLIC PANEL WITH GFR -  Abnormal; Notable for the following components:      Result Value   Chloride 96 (*)    Glucose, Bld 180 (*)    All other components within normal limits  LACTIC ACID, PLASMA - Abnormal; Notable for the following components:   Lactic Acid, Venous 2.2 (*)    All other components within normal limits  CBC WITH DIFFERENTIAL/PLATELET - Abnormal; Notable for the following components:   WBC 10.9 (*)    RBC 5.21 (*)    Hemoglobin 17.1 (*)    HCT 52.6 (*)    MCV 101.0 (*)    Neutro Abs 8.1 (*)    Monocytes Absolute 1.2 (*)    All other components within normal limits  I-STAT VENOUS BLOOD GAS, ED - Abnormal; Notable for the following components:   pCO2, Ven 69.3 (*)    Bicarbonate 36.4 (*)     TCO2 39 (*)    Acid-Base Excess 7.0 (*)    Calcium, Ion 1.08 (*)    HCT 54.0 (*)    Hemoglobin 18.4 (*)    All other components within normal limits  RESP PANEL BY RT-PCR (RSV, FLU A&B, COVID)  RVPGX2  CULTURE, BLOOD (ROUTINE X 2)  CULTURE, BLOOD (ROUTINE X 2)  PRO BRAIN NATRIURETIC PEPTIDE  MAGNESIUM   LACTIC ACID, PLASMA  URINALYSIS, ROUTINE W REFLEX MICROSCOPIC  I-STAT VENOUS BLOOD GAS, ED  TROPONIN T, HIGH SENSITIVITY  TROPONIN T, HIGH SENSITIVITY    EKG: EKG Interpretation Date/Time:  Sunday August 28 2024 16:28:49 EST Ventricular Rate:  120 PR Interval:  146 QRS Duration:  94 QT Interval:  329 QTC Calculation: 465 R Axis:   -81  Text Interpretation: Sinus tachycardia Probable left atrial enlargement Left anterior fascicular block Consider RVH w/ secondary repol abnormality agree, no old comparison on file Confirmed by Armenta Canning [phone]) on 08/28/2024 4:37:00 PM  Radiology: [facility] Chest Portable 1 View Result Date: 08/28/2024 CLINICAL DATA:  Shortness of breath, cough, and congestion. EXAM: PORTABLE CHEST 1 VIEW COMPARISON:  12/05/2023. FINDINGS: Heart is normal in size and the mediastinal contour is within normal limits. Atherosclerotic calcification of the aorta is noted. The pulmonary vasculature is distended. Interstitial prominence is noted bilaterally. No consolidation or pneumothorax is seen. There are suspected small bilateral pleural effusions. No acute osseous abnormality. IMPRESSION: 1. Distended pulmonary vasculature with interstitial prominence bilaterally, possible edema versus infection. 2. Suspected small bilateral pleural effusions. Electronically Signed   By: Leita Birmingham M.D.   On: 08/28/2024 16:55     Procedures  CRITICAL CARE Performed by: Canning Armenta   Total critical care time: 60 minutes  Critical care time was exclusive of separately billable procedures and treating other patients.  Critical care was necessary to treat or prevent  imminent or life-threatening deterioration.  Critical care was time spent personally by me on the following activities: development of treatment plan with patient and/or surrogate as well as nursing, discussions with consultants, evaluation of patient's response to treatment, examination of patient, obtaining history from patient or surrogate, ordering and performing treatments and interventions, ordering and review of laboratory studies, ordering and review of radiographic studies, pulse oximetry and re-evaluation of patient's condition.  Medications Ordered in the ED  azithromycin  (ZITHROMAX ) 500 mg in sodium chloride  0.9 % 250 mL IVPB (500 mg Intravenous New Bag/Given 08/28/24 1912)  ipratropium-albuterol  (DUONEB) 0.5-2.5 (3) MG/3ML nebulizer solution 3 mL (3 mLs Nebulization Given 08/28/24 1728)  ipratropium-albuterol  (DUONEB) 0.5-2.5 (3) MG/3ML nebulizer solution 3 mL (3 mLs Nebulization Given 08/28/24  1634)  furosemide  (LASIX ) injection 60 mg (60 mg Intravenous Given 08/28/24 1733)  methylPREDNISolone  sodium succinate (SOLU-MEDROL ) 125 mg/2 mL injection 125 mg (125 mg Intravenous Given 08/28/24 1905)                                    Medical Decision Making Amount and/or Complexity of Data Reviewed Labs: ordered. Radiology: ordered.  Risk Prescription drug management. Decision regarding hospitalization.  Patient presents.  She has had fairly rapid onset of shortness of breath and presented to the emergency department hypoxic at 64% first reading.  Patient was immediately placed on monitor and supplemental oxygen.  With nonrebreather mask oxygen saturation came up into the high 90s.  Patient maintained clear mental status.  She did not show signs of eminent respiratory collapse.  I obtained history and evaluated chest x-ray at bedside.  Chest x-ray reviewed by myself mild vascular congestion.  No focal consolidations or pneumothorax.  Patient was started on DuoNeb therapy and Lasix .   Differential diagnosis includes COPD.  By review of EMR patient does have emphysema and is an active smoker.  Also, patient has had mild CHF.  She and her husband describe fairly rapid onset of symptoms with a lot of productive coughing but minimal constitutional symptoms and no chest pain.  Venous gas shows pCO2 of 69.  With hypercapnia and COPD exacerbation with possibly some element of CHF, I do believe patient will benefit from BiPAP.  Patient transition to BiPAP and tolerating well.  EKG does not show any acute ischemic appearance.  Troponin less than 15 and BNP 187.  At this time, based on troponin, EKG, BNP and physical exam, ACS and acute CHF appear lower probability.  Will add Solu-Medrol  and continue nebulizer therapy.  Zithromax  added for coverage of COPD exacerbation with history suggestive of infectious component.  Patient is rechecked on BiPAP.  She is tolerating this well and vital signs show improvement in heart rate, blood pressure stable.  Appropriate for admission for COPD exacerbation.  Patient does not show any sign of declining respiratory status.  Consult: Dr. Franky for admission     Final diagnoses:  COPD exacerbation (HCC)  Hypoxia  Hypercapnia    ED Discharge Orders     None          Armenta Canning, MD 08/28/24 [phone]

## 2024-08-29 ENCOUNTER — Inpatient Hospital Stay (HOSPITAL_COMMUNITY)

## 2024-08-29 ENCOUNTER — Encounter (HOSPITAL_COMMUNITY): Payer: Self-pay | Admitting: Internal Medicine

## 2024-08-29 DIAGNOSIS — Z833 Family history of diabetes mellitus: Secondary | ICD-10-CM | POA: Diagnosis not present

## 2024-08-29 DIAGNOSIS — E662 Morbid (severe) obesity with alveolar hypoventilation: Secondary | ICD-10-CM | POA: Diagnosis present

## 2024-08-29 DIAGNOSIS — E872 Acidosis, unspecified: Secondary | ICD-10-CM | POA: Diagnosis present

## 2024-08-29 DIAGNOSIS — Z8249 Family history of ischemic heart disease and other diseases of the circulatory system: Secondary | ICD-10-CM | POA: Diagnosis not present

## 2024-08-29 DIAGNOSIS — Z720 Tobacco use: Secondary | ICD-10-CM

## 2024-08-29 DIAGNOSIS — J9612 Chronic respiratory failure with hypercapnia: Secondary | ICD-10-CM

## 2024-08-29 DIAGNOSIS — Z1152 Encounter for screening for COVID-19: Secondary | ICD-10-CM | POA: Diagnosis not present

## 2024-08-29 DIAGNOSIS — J441 Chronic obstructive pulmonary disease with (acute) exacerbation: Secondary | ICD-10-CM | POA: Diagnosis present

## 2024-08-29 DIAGNOSIS — Z6832 Body mass index (BMI) 32.0-32.9, adult: Secondary | ICD-10-CM | POA: Diagnosis not present

## 2024-08-29 DIAGNOSIS — Z880 Allergy status to penicillin: Secondary | ICD-10-CM | POA: Diagnosis not present

## 2024-08-29 DIAGNOSIS — F17210 Nicotine dependence, cigarettes, uncomplicated: Secondary | ICD-10-CM | POA: Diagnosis present

## 2024-08-29 DIAGNOSIS — J9600 Acute respiratory failure, unspecified whether with hypoxia or hypercapnia: Secondary | ICD-10-CM | POA: Diagnosis present

## 2024-08-29 DIAGNOSIS — Z7982 Long term (current) use of aspirin: Secondary | ICD-10-CM | POA: Diagnosis not present

## 2024-08-29 DIAGNOSIS — J440 Chronic obstructive pulmonary disease with acute lower respiratory infection: Secondary | ICD-10-CM | POA: Diagnosis present

## 2024-08-29 DIAGNOSIS — I5033 Acute on chronic diastolic (congestive) heart failure: Secondary | ICD-10-CM | POA: Diagnosis not present

## 2024-08-29 DIAGNOSIS — Z923 Personal history of irradiation: Secondary | ICD-10-CM | POA: Diagnosis not present

## 2024-08-29 DIAGNOSIS — Z9221 Personal history of antineoplastic chemotherapy: Secondary | ICD-10-CM | POA: Diagnosis not present

## 2024-08-29 DIAGNOSIS — Z853 Personal history of malignant neoplasm of breast: Secondary | ICD-10-CM | POA: Diagnosis not present

## 2024-08-29 DIAGNOSIS — J9621 Acute and chronic respiratory failure with hypoxia: Secondary | ICD-10-CM | POA: Diagnosis present

## 2024-08-29 DIAGNOSIS — I5043 Acute on chronic combined systolic (congestive) and diastolic (congestive) heart failure: Secondary | ICD-10-CM | POA: Diagnosis present

## 2024-08-29 DIAGNOSIS — E875 Hyperkalemia: Secondary | ICD-10-CM | POA: Diagnosis present

## 2024-08-29 DIAGNOSIS — J9601 Acute respiratory failure with hypoxia: Secondary | ICD-10-CM

## 2024-08-29 DIAGNOSIS — Z79899 Other long term (current) drug therapy: Secondary | ICD-10-CM | POA: Diagnosis not present

## 2024-08-29 DIAGNOSIS — R0602 Shortness of breath: Secondary | ICD-10-CM | POA: Diagnosis present

## 2024-08-29 DIAGNOSIS — G4733 Obstructive sleep apnea (adult) (pediatric): Secondary | ICD-10-CM | POA: Diagnosis not present

## 2024-08-29 DIAGNOSIS — J9622 Acute and chronic respiratory failure with hypercapnia: Secondary | ICD-10-CM | POA: Diagnosis present

## 2024-08-29 DIAGNOSIS — J432 Centrilobular emphysema: Secondary | ICD-10-CM | POA: Diagnosis present

## 2024-08-29 DIAGNOSIS — J9811 Atelectasis: Secondary | ICD-10-CM | POA: Diagnosis not present

## 2024-08-29 DIAGNOSIS — D751 Secondary polycythemia: Secondary | ICD-10-CM | POA: Diagnosis present

## 2024-08-29 DIAGNOSIS — I700 Atherosclerosis of aorta: Secondary | ICD-10-CM | POA: Diagnosis present

## 2024-08-29 DIAGNOSIS — J9312 Secondary spontaneous pneumothorax: Secondary | ICD-10-CM | POA: Diagnosis not present

## 2024-08-29 LAB — BLOOD GAS, VENOUS
Acid-Base Excess: 6.2 mmol/L — ABNORMAL HIGH (ref 0.0–2.0)
Acid-Base Excess: 8.3 mmol/L — ABNORMAL HIGH (ref 0.0–2.0)
Bicarbonate: 37.2 mmol/L — ABNORMAL HIGH (ref 20.0–28.0)
Bicarbonate: 38.6 mmol/L — ABNORMAL HIGH (ref 20.0–28.0)
Drawn by: 71375
O2 Saturation: 96.5 %
O2 Saturation: 79 %
Patient temperature: 36.4
Patient temperature: 36.4
pCO2, Ven: 79 mmHg (ref 44–60)
pCO2, Ven: 82 mmHg (ref 44–60)
pH, Ven: 7.28 (ref 7.25–7.43)
pH, Ven: 7.28 (ref 7.25–7.43)
pO2, Ven: 72 mmHg — ABNORMAL HIGH (ref 32–45)
pO2, Ven: 44 mmHg (ref 32–45)

## 2024-08-29 LAB — CBC WITH DIFFERENTIAL/PLATELET
Abs Immature Granulocytes: 0.01 K/uL (ref 0.00–0.07)
Basophils Absolute: 0 K/uL (ref 0.0–0.1)
Basophils Relative: 0 %
Eosinophils Absolute: 0 K/uL (ref 0.0–0.5)
Eosinophils Relative: 0 %
HCT: 52.7 % — ABNORMAL HIGH (ref 36.0–46.0)
Hemoglobin: 16.7 g/dL — ABNORMAL HIGH (ref 12.0–15.0)
Immature Granulocytes: 0 %
Lymphocytes Relative: 6 %
Lymphs Abs: 0.4 K/uL — ABNORMAL LOW (ref 0.7–4.0)
MCH: 32.7 pg (ref 26.0–34.0)
MCHC: 31.7 g/dL (ref 30.0–36.0)
MCV: 103.3 fL — ABNORMAL HIGH (ref 80.0–100.0)
Monocytes Absolute: 0.1 K/uL (ref 0.1–1.0)
Monocytes Relative: 2 %
Neutro Abs: 6.1 K/uL (ref 1.7–7.7)
Neutrophils Relative %: 92 %
Platelets: 163 K/uL (ref 150–400)
RBC: 5.1 MIL/uL (ref 3.87–5.11)
RDW: 13.9 % (ref 11.5–15.5)
WBC: 6.6 K/uL (ref 4.0–10.5)
nRBC: 0 % (ref 0.0–0.2)

## 2024-08-29 LAB — PHOSPHORUS: Phosphorus: 4.6 mg/dL (ref 2.5–4.6)

## 2024-08-29 LAB — ECHOCARDIOGRAM COMPLETE
AR max vel: 1.55 cm2
AV Area VTI: 1.87 cm2
AV Area mean vel: 1.73 cm2
AV Mean grad: 5 mmHg
AV Peak grad: 10.4 mmHg
Ao pk vel: 1.61 m/s
Area-P 1/2: 4.01 cm2
Calc EF: 65.5 %
Height: 68 in
MV VTI: 2.38 cm2
S' Lateral: 2.9 cm
Single Plane A2C EF: 67.4 %
Single Plane A4C EF: 61.7 %
Weight: 3350.99 [oz_av]

## 2024-08-29 LAB — COMPREHENSIVE METABOLIC PANEL WITH GFR
ALT: 27 U/L (ref 0–44)
AST: 24 U/L (ref 15–41)
Albumin: 4.1 g/dL (ref 3.5–5.0)
Alkaline Phosphatase: 86 U/L (ref 38–126)
Anion gap: 11 (ref 5–15)
BUN: 15 mg/dL (ref 8–23)
CO2: 30 mmol/L (ref 22–32)
Calcium: 9 mg/dL (ref 8.9–10.3)
Chloride: 98 mmol/L (ref 98–111)
Creatinine, Ser: 0.72 mg/dL (ref 0.44–1.00)
GFR, Estimated: >60 mL/min (ref >60)
Glucose, Bld: 164 mg/dL — ABNORMAL HIGH (ref 70–99)
Potassium: 4.4 mmol/L (ref 3.5–5.1)
Sodium: 140 mmol/L (ref 135–145)
Total Bilirubin: 0.4 mg/dL (ref 0.0–1.2)
Total Protein: 6.8 g/dL (ref 6.5–8.1)

## 2024-08-29 LAB — PROCALCITONIN: Procalcitonin: <0.1 ng/mL

## 2024-08-29 LAB — PRO BRAIN NATRIURETIC PEPTIDE: Pro Brain Natriuretic Peptide: 237 pg/mL (ref <300.0)

## 2024-08-29 LAB — MRSA NEXT GEN BY PCR, NASAL: MRSA by PCR Next Gen: NOT DETECTED

## 2024-08-29 LAB — MAGNESIUM: Magnesium: 3.2 mg/dL — ABNORMAL HIGH (ref 1.7–2.4)

## 2024-08-29 MED ORDER — ORAL CARE MOUTH RINSE
15.0000 mL | OROMUCOSAL | Status: DC
  Administered 2024-08-29 – 2024-09-07 (×30): 15 mL via OROMUCOSAL

## 2024-08-29 MED ORDER — NICOTINE 21 MG/24HR TD PT24
21.0000 mg | MEDICATED_PATCH | Freq: Every day | TRANSDERMAL | Status: DC | PRN
  Administered 2024-08-31 – 2024-09-03 (×4): 21 mg via TRANSDERMAL
  Filled 2024-08-29 (×5): qty 1

## 2024-08-29 MED ORDER — ONDANSETRON HCL 4 MG/2ML IJ SOLN: 4.0000 mg | Freq: Four times a day (QID) | INTRAMUSCULAR | Status: DC | PRN

## 2024-08-29 MED ORDER — MAGNESIUM SULFATE 2 GM/50ML IV SOLN
2.0000 g | Freq: Once | INTRAVENOUS | Status: AC
  Administered 2024-08-29: 2 g via INTRAVENOUS
  Filled 2024-08-29: qty 50

## 2024-08-29 MED ORDER — AZITHROMYCIN 250 MG PO TABS
250.0000 mg | ORAL_TABLET | Freq: Every day | ORAL | Status: DC
  Administered 2024-08-29 – 2024-09-05 (×8): 250 mg via ORAL
  Filled 2024-08-29 (×8): qty 1

## 2024-08-29 MED ORDER — ORAL CARE MOUTH RINSE: 15.0000 mL | OROMUCOSAL | Status: DC | PRN

## 2024-08-29 MED ORDER — SODIUM CHLORIDE 0.9 % IV SOLN: 500.0000 mg | INTRAVENOUS | Status: DC

## 2024-08-29 MED ORDER — CHLORHEXIDINE GLUCONATE CLOTH 2 % EX PADS
6.0000 | MEDICATED_PAD | Freq: Every day | CUTANEOUS | Status: DC
  Administered 2024-08-29 – 2024-09-03 (×6): 6 via TOPICAL

## 2024-08-29 MED ORDER — ACETAMINOPHEN 650 MG RE SUPP: 650.0000 mg | Freq: Four times a day (QID) | RECTAL | Status: DC | PRN

## 2024-08-29 MED ORDER — BENZONATATE 100 MG PO CAPS
200.0000 mg | ORAL_CAPSULE | Freq: Three times a day (TID) | ORAL | Status: DC
  Administered 2024-08-29 – 2024-09-07 (×27): 200 mg via ORAL
  Filled 2024-08-29 (×27): qty 2

## 2024-08-29 MED ORDER — SPIRONOLACTONE 25 MG PO TABS
25.0000 mg | ORAL_TABLET | Freq: Every day | ORAL | Status: DC
  Administered 2024-08-29 – 2024-08-30 (×2): 25 mg via ORAL
  Filled 2024-08-29 (×2): qty 1

## 2024-08-29 MED ORDER — NICOTINE POLACRILEX 2 MG MT GUM
2.0000 mg | CHEWING_GUM | OROMUCOSAL | Status: DC | PRN
  Administered 2024-08-31: 2 mg via ORAL
  Filled 2024-08-29 (×3): qty 1

## 2024-08-29 MED ORDER — IPRATROPIUM-ALBUTEROL 0.5-2.5 (3) MG/3ML IN SOLN
3.0000 mL | Freq: Four times a day (QID) | RESPIRATORY_TRACT | Status: DC
  Administered 2024-08-29 – 2024-09-01 (×14): 3 mL via RESPIRATORY_TRACT
  Filled 2024-08-29 (×12): qty 3

## 2024-08-29 MED ORDER — METHYLPREDNISOLONE SODIUM SUCC 125 MG IJ SOLR
80.0000 mg | Freq: Two times a day (BID) | INTRAMUSCULAR | Status: DC
  Administered 2024-08-29 – 2024-08-30 (×3): 80 mg via INTRAVENOUS
  Filled 2024-08-29 (×3): qty 2

## 2024-08-29 MED ORDER — FUROSEMIDE 10 MG/ML IJ SOLN
40.0000 mg | Freq: Two times a day (BID) | INTRAMUSCULAR | Status: DC
  Administered 2024-08-29 – 2024-08-30 (×3): 40 mg via INTRAVENOUS
  Filled 2024-08-29 (×3): qty 4

## 2024-08-29 MED ORDER — ALBUTEROL SULFATE (2.5 MG/3ML) 0.083% IN NEBU
2.5000 mg | INHALATION_SOLUTION | RESPIRATORY_TRACT | Status: DC | PRN
  Administered 2024-09-04 (×2): 2.5 mg via RESPIRATORY_TRACT
  Filled 2024-08-29 (×3): qty 3

## 2024-08-29 MED ORDER — DM-GUAIFENESIN ER 30-600 MG PO TB12
1.0000 | ORAL_TABLET | Freq: Two times a day (BID) | ORAL | Status: DC
  Administered 2024-08-29 – 2024-08-31 (×6): 1 via ORAL
  Filled 2024-08-29 (×6): qty 1

## 2024-08-29 MED ORDER — ACETAMINOPHEN 325 MG PO TABS: 650.0000 mg | ORAL_TABLET | Freq: Four times a day (QID) | ORAL | Status: DC | PRN

## 2024-08-29 NOTE — Progress Notes (Signed)
   08/29/24 0008  BiPAP/CPAP/SIPAP  $ Non-Invasive Ventilator  Non-Invasive Vent Initial (transfered from [facility])  $ Face Mask Medium Yes (transfered to [facility] from [facility])  BiPAP/CPAP/SIPAP Pt Type Adult  BiPAP/CPAP/SIPAP SERVO (air)  Mask Type Full face mask  Dentures removed? Not applicable  Mask Size Medium  Set Rate 15 breaths/min  Respiratory Rate 22 breaths/min  Pressure Support 9 cmH20  PEEP 5 cmH20  FiO2 (%) 40 %  Flow Rate 0 lpm  Minute Ventilation 9.3  Leak 63  Peak Inspiratory Pressure (PIP) 8  Tidal Volume (Vt) 462  Patient Home Machine No  Patient Home Mask No  Patient Home Tubing No  Auto Titrate No  Press High Alarm 25 cmH2O  BiPAP/CPAP /SiPAP Vitals  Pulse Rate 96  Resp 20  SpO2 93 %  MEWS Score/Color  MEWS Score 0  MEWS Score Color Green

## 2024-08-29 NOTE — H&P (Signed)
 History and Physical      Stacey [name] FMW:[id number] DOB: [birth year] DOA: 08/28/2024; DOS: 08/29/2024  PCP: Rollene Almarie LABOR, MD  Patient coming from: home   I have personally briefly reviewed patient's old medical records in [facility] Link  Chief Complaint: sob  HPI: Stacey [name] is a 68 y.o. female with medical history significant for chronic systolic/diastolic heart failure, emphysema, chronic tobacco abuse, who is admitted to [facility] on 08/28/2024 by way of transfer from [facility] with acute hypoxic hypercapnic respiratory failure in the setting of suspected acute systolic/diastolic heart failure exacerbation as well as acute COPD exacerbation, after presenting from home to the latter facility complaining of shortness of breath.  The patient reports 3 days of progressive shortness of breath associated with a nonproductive cough, wheezing, orthopnea, as well as mild worsening of edema in the bilateral lower extremities.  Denies any associated chest pain, palpitations, diaphoresis.  Denies any associated subjective fever, chills, rigors, or generalized myalgias.  Not associate with any hemoptysis.  She confirms no known baseline supplemental oxygen requirements.  She has a known history of chronic systolic/diastolic heart failure, with most recent echocardiogram on 02/22/2024 notable for LVEF 45 to 50%, global left ventricular hypokinesis, grade 1 diastolic dysfunction, normal right ventricular systolic function, mildly dilated left atrium and no evidence of significant valvular pathology.  She reports good compliance with her outpatient diuretic regimen which includes Lasix  40 mg p.o. daily as well as spironolactone .  She is also a current, long-term smoker, having spent, on average, 1 of pack per day for greater than 40 years.  He has a history of emphysema with CT chest in 2025 showing evidence of centrilobular emphysema.  Not currently on any scheduled  breathing treatments as an outpatient.    [facility] ED Course:  Vital signs in the ED were notable for the following: Afebrile; initial heart rates were in the 120s 130s, associated with sinus tachycardia, with ensuing decrease in heart rates into the 80s to 90s following initiation of BiPAP; systolic blood pressures initially in the 170s, Sosan decreasing into the 120s to 130s following initiation of PAP as well as IV Lasix ; respiratory rate 16-28, initial oxygen saturations were in the high 60s to low 70s on room air, Sosan improving into the range of 93 to 96% on BiPAP, with most recent settings noted to be 14/5 with 40% FiO2.  Labs were notable for the following: I-STAT VBG notable for the following: 7.327/69.3; CMP notable for the following: Sodium 138 potassium 4.0, bicarbonate 32, creatinine 0.79, anion gap 10, liver enzymes are within normal limits.  Magnesium  level 1.7.  High sensitive troponin I less than 15, with repeat noted to be 18.  proBNP 187, without any prior proBNP data points available for point comparison.  Initial lactate 2.2, with repeat trending down to 1.2.  CBC notable for white blood cell count 10,900 with 76% neutrophils, hemoglobin 17.1.  Urinalysis showed no white blood cells and was negative for leukocyte esterase as well as nitrate.  Blood cultures x 2 were collected prior to initiation of IV antibiotics.  COVID, influenza, RSV PCR were all negative.  Per my interpretation, EKG in ED demonstrated the following: In comparison to most recent prior EKG from 12/28/2023, today's EKG showed sinus tachycardia with heart rate 120, left anterior fascicular block, normal intervals, nonspecific T wave inversions in leads aVL, V1, and V2, of which T wave inversions in V1 and V2 appear unchanged from  most recent prior EKG, demonstrate no evidence of ST changes, including no evidence of ST elevation.  Imaging in the ED, per corresponding formal radiology read, was notable for  the following: 1 view chest x-ray showed increased in pulmonary vascular congestion, along with evidence of bilateral interstitial opacities concerning for edema, as well as small bilateral pleural effusions, in the absence of evidence of infiltrate or pneumothorax.  While in the ED, the following were administered: Lasix  60 mg IV x 1 dose, duo nebulizer treatments x 3, solumedrol 125 mg IV times a dose, azithromycin  500 mg IV x 1 dose.  Subsequently, the patient was [facility] to [facility] for further evaluation and management of presenting acute hypoxic hypercapnic respiratory failure with suspected contributions from acute on chronic systolic/diastolic heart failure as well as acute COPD exacerbation.    Review of Systems: As per HPI otherwise 10 point review of systems negative.   Past Medical History:  Diagnosis Date   Aortic atherosclerosis    Breast cancer (HCC) 09/22/2006   L side, s/p lumpectomy, chemotherapy and radiation   Centrilobular emphysema (HCC)    Chronic combined systolic and diastolic heart failure (HCC)    Edema    Leg swelling    OSA (obstructive sleep apnea)    Panic disorder    SOB (shortness of breath)    Tobacco abuse     Past Surgical History:  Procedure Laterality Date   BREAST LUMPECTOMY  2008    Social History:  reports that she has been smoking cigarettes. She has a 67.5 pack-year smoking history. She has never used smokeless tobacco. She reports current alcohol use. She reports that she does not use drugs.   Allergies  Allergen Reactions   Amoxicillin     REACTION: rash   Penicillins     REACTION: rash    Family History  Problem Relation Age of Onset   Diabetes Other    Diabetes Mother    Heart disease Father    Breast cancer Sister    Heart disease Brother    Breast cancer Paternal Aunt    Colon cancer Paternal Aunt    Heart disease Maternal Grandfather    Celiac disease Paternal Grandmother    Heart disease Paternal Grandfather       Prior to Admission medications   Medication Sig Start Date End Date Taking? Authorizing Provider  aspirin EC 81 MG tablet Take 81 mg by mouth daily. Swallow whole.    [provider]  Calcium-Magnesium -Vitamin D  600-40-500 MG-MG-UNIT TB24 Take by mouth daily.    [provider]  Cholecalciferol (VITAMIN D -3) 1000 UNITS CAPS Take 250 capsules by mouth daily.    [provider]  diazepam  (VALIUM ) 5 MG tablet Take 1 tablet (5 mg total) by mouth daily as needed for anxiety. 12/31/20   Rollene Almarie LABOR, MD  furosemide  (LASIX ) 40 MG tablet Take 1 tablet (40 mg total) by mouth daily. 01/20/24   Court Dorn PARAS, MD  Multiple Vitamins-Minerals (CENTRUM SILVER) tablet Take 1 tablet by mouth daily.    [provider]  spironolactone  (ALDACTONE ) 25 MG tablet Take 1 tablet (25 mg total) by mouth daily. 03/03/24   Goodrich, Callie E, PA-C  vitamin B-12 (CYANOCOBALAMIN ) 1000 MCG tablet Take 1,000 mcg by mouth daily.    [provider]  vitamin C (ASCORBIC ACID) 500 MG tablet Take 1,000 mg by mouth daily.    [provider]     Objective    Physical Exam: Vitals:  08/28/24 2237 08/28/24 2245 08/29/24 0008 08/29/24 0019  BP:    (!) 152/97  Pulse:  93 96   Resp:  (!) 24 20   Temp:    (!) 97.5 F (36.4 C)  TempSrc:    Oral  SpO2: 94% 92% 93% 92%  Weight:    95 kg  Height:    5' 8 (1.727 m)    General: appears to be stated age; alert, oriented; on bipap Skin: warm, dry, no rash Head:  AT/Burns Mouth:  Oral mucosa membranes appear moist, normal dentition Neck: supple; trachea midline Heart:  RRR; did not appreciate any M/R/G Lungs: CTAB, did not appreciate any wheezes, rales, or rhonchi Abdomen: + BS; soft, ND, NT Vascular: 2+ pedal pulses b/l; 2+ radial pulses b/l Extremities: 1-2+ edema in b/l LE's; no muscle wasting     Labs on Admission: I have personally reviewed following labs and imaging studies  CBC: Recent Labs  Lab  08/28/24 1635 08/28/24 1658 08/28/24 2026  WBC 10.9*  --   --   NEUTROABS 8.1*  --   --   HGB 17.1* 18.4* 17.7*  HCT 52.6* 54.0* 52.0*  MCV 101.0*  --   --   PLT 162  --   --    Basic Metabolic Panel: Recent Labs  Lab 08/28/24 1635 08/28/24 1658 08/28/24 2026  NA 138 137 139  K 4.0 4.4 4.2  CL 96*  --   --   CO2 32  --   --   GLUCOSE 180*  --   --   BUN 12  --   --   CREATININE 0.79  --   --   CALCIUM 8.9  --   --   MG 1.7  --   --    GFR: Estimated Creatinine Clearance: 79.9 mL/min (by C-G formula based on SCr of 0.79 mg/dL). Liver Function Tests: Recent Labs  Lab 08/28/24 1635  AST 30  ALT 27  ALKPHOS 91  BILITOT 0.6  PROT 7.1  ALBUMIN 4.2   No results for input(s): LIPASE, AMYLASE in the last 168 hours. No results for input(s): AMMONIA in the last 168 hours. Coagulation Profile: No results for input(s): INR, PROTIME in the last 168 hours. Cardiac Enzymes: No results for input(s): CKTOTAL, CKMB, CKMBINDEX, TROPONINI in the last 168 hours. BNP (last 3 results) Recent Labs    12/28/23 1535 01/04/24 1524 08/28/24 1635  PROBNP 176.0* 202.0* 187.0   HbA1C: No results for input(s): HGBA1C in the last 72 hours. CBG: No results for input(s): GLUCAP in the last 168 hours. Lipid Profile: No results for input(s): CHOL, HDL, LDLCALC, TRIG, CHOLHDL, LDLDIRECT in the last 72 hours. Thyroid  Function Tests: No results for input(s): TSH, T4TOTAL, FREET4, T3FREE, THYROIDAB in the last 72 hours. Anemia Panel: No results for input(s): VITAMINB12, FOLATE, FERRITIN, TIBC, IRON, RETICCTPCT in the last 72 hours. Urine analysis:    Component Value Date/Time   COLORURINE STRAW (A) 08/28/2024 2032   APPEARANCEUR CLEAR 08/28/2024 2032   LABSPEC 1.015 08/28/2024 2032   PHURINE 5.5 08/28/2024 2032   GLUCOSEU NEGATIVE 08/28/2024 2032   GLUCOSEU NEGATIVE 03/14/2013 0907   HGBUR SMALL (A) 08/28/2024 2032   BILIRUBINUR  NEGATIVE 08/28/2024 2032   KETONESUR NEGATIVE 08/28/2024 2032   PROTEINUR >=300 (A) 08/28/2024 2032   UROBILINOGEN 0.2 03/14/2013 0907   NITRITE NEGATIVE 08/28/2024 2032   LEUKOCYTESUR NEGATIVE 08/28/2024 2032    Radiological Exams on Admission: DG Chest Portable 1 View Result Date: 08/28/2024  CLINICAL DATA:  Shortness of breath, cough, and congestion. EXAM: PORTABLE CHEST 1 VIEW COMPARISON:  12/05/2023. FINDINGS: Heart is normal in size and the mediastinal contour is within normal limits. Atherosclerotic calcification of the aorta is noted. The pulmonary vasculature is distended. Interstitial prominence is noted bilaterally. No consolidation or pneumothorax is seen. There are suspected small bilateral pleural effusions. No acute osseous abnormality. IMPRESSION: 1. Distended pulmonary vasculature with interstitial prominence bilaterally, possible edema versus infection. 2. Suspected small bilateral pleural effusions. Electronically Signed   By: Leita Birmingham M.D.   On: 08/28/2024 16:55      Assessment/Plan    Principal Problem:   Acute hypoxic on chronic hypercapnic respiratory failure (HCC) Active Problems:   Tobacco abuse   SOB (shortness of breath)   Acute on chronic combined systolic and diastolic CHF (congestive heart failure) (HCC)   COPD with acute exacerbation (HCC)   Lactic acidosis       #) Acute hypoxic hypercapnic respiratory failure: In the setting of acute respiratory symptoms, including 3 days of progressive shortness of breath with no known baseline supplemental oxygen requirements, initial oxygen saturation was found to be in the high 60s to low 70s on room air, Sosan improving into the mid 90s on BiPAP as further quantified above, thereby meeting criteria for acute hypoxic respiratory failure.  Additionally, hypercapnia noted on presenting VBG, with pCO2 of 69.3.  However, given, stated pH associated with this elevated CO2 level, suspect that there is an element of  chronicity associated with her hypercapnia, potentially be a contribution from obesity hypoventilation syndrome in the setting of presenting BMI in excess of 30 as well as potential contribution from her underlying COPD.   In terms of factors contributing to her acute hypoxia, suspect contribution from acute on chronic systolic/diastolic heart failure as well as a potential secondary contribution from acute COPD exacerbation.   In considering other potential factors contributing to her presenting acute hypoxia, present chest x-ray shows no evidence of infiltrate to suggest pneumonia.  Will add on procalcitonin level to further assess.  ACS appears less likely, in the absence of any recent chest pain, We will EKG shows no evidence of acute ischemic changes, including no evidence of ST elevation, and troponin x 2 were found to be nonelevated, in spite of 3 days of symptoms.  Clinically, presentation appears less suggestive of acute pulmonary embolism, present chest x-ray shows no evidence of underlying pneumothorax.  Additionally, COVID, influenza, and RSV PCR were all negative when checked in [facility] earlier this evening.  Plan: Further evaluation management of suspected acute on chronic systolic/diastolic heart failure, as further detailed below, including Lasix  40 mg IV twice daily.  Repeat proBNP in the morning.  Check updated echocardiogram in the morning.  Monitor strict I's and O's and daily weights.  Continue BiPAP for now.  Further evaluation management of suspected contribution from acute COPD exacerbation, including continuation of Solu-Medrol , duo nebulizers on scheduled basis, as well as as needed albuterol  nebulizers.  Repeat VBG.  Add on procalcitonin level.  Magnesium  sulfate 2 g IV over 2 hours.  Repeat serum magnesium  level and check phosphorus level in the morning.  CMP, CBC in the morning.                   #) Acute on chronic systolic/diastolic heart failure:  In the context of a documented history of chronic systolic/diastolic heart failure, with most recent echocardiogram on 02/22/2024 notable for LVEF 45 to 50%,  with global left ventricular hypokinesis, grade 1 diastolic dysfunction, presentation suggestive of acute exacerbation thereof in the setting of 3 days of progressive shortness of breath associate with orthopnea, worsening of edema in the bilateral lower extremities, proBNP of 187, which appears elevated when taking into account the patient's body habitus, with chest x-ray showing evidence of increase in pulmonary vascular congestion as well as suggestion of bilateral interstitial edema in addition to small bilateral pleural effusions.  Patient reports good compliance with her outpatient regimen of Lasix  40 mg p.o. daily as well as daily spironolactone .  As noted above, ACS appears less likely, the absence of any recent chest pain, along with EKG that shows no evidence of acute ischemic changes, and nonelevated high-sensitivity troponin I x 2 values.  She received Lasix  60 mg IV x 1 dose at [facility] earlier this evening.  Will continue with diuresis via IV Lasix  as well as further trending of volume status and monitoring of renal function/associated electrolytes, as outlined below.  Plan: Lasix  40 mg IV twice daily.  Monitor strict I's and O's and daily weights.  Check proBNP in the morning.  2 g of IV magnesium  sulfate over 2 hours, with repeat magnesium  level ordered for the morning.  CMP, CBC in the morning.  Monitor on telemetry.  Monitor procalcitonin level.  Repeat echocardiogram in the morning.  Continue BiPAP for now.                     #) Acute COPD exacerbation: In setting of document history of emphysema, with centrilobular emphysema noted on prior CT chest will in the setting of a long/current tobacco smoking history, suspect an element of acute exacerbation thereof in the setting of 3 days of progressive shortness  of breath, nonproductive cough.  This is potentially a secondary implication of acute on chronic systolic/diastolic heart failure, as above.  No infiltrate on presenting chest x-ray to suggest underlying pneumonia, but will check procalcitonin level to further assess.  Additionally, COVID, influenza, RSV PCR were all negative.  Started on solumedrol at [facility] earlier this evening.  Will continue existing BiPAP and recheck blood gas, as outlined below.  Plan: IV solumedrol.  Scheduled duo nebulizer treatments, as needed albuterol  nebulizers.  Continue BiPAP for now.  Repeat VBG.  Monitor strict I's and O's and daily weights.  Check procalcitonin level.  Magnesium  sulfate 2 g IV over 2 hours.  Repeat magnesium  level in the morning.  Check serum phosphorus level.  CMP, CBC in the morning.  Counseled the patient on importance of reduction in cigarette smoking.  Continue azithromycin  for associated benefit of reduction in duration of hospitalization associated with COPD exacerbation requiring hospitalization.                    #) Lactic acidosis: Initial lactate mildly elevated 2.2, with repeat trending down to 1.1.  Suspect contribution from generalized relative hypoperfusion as a consequence of decline in oxygen carrying/delivery capacity as a result of initial acute hypoxia, with initial oxygen saturations noted to be in the high 60s to low 70s on room air in the setting of presenting acute on chronic systolic/diastolic heart failure.  In the absence of LipoSil greater than 12,000 and in the absence of objective fever, SIRS criteria for sepsis are not currently met.  No overt evidence of underlying infectious process.  Will check procalcitonin level to further assess.   Plan: Monitor strict I's and O's  and daily weights.  Further evaluation management of acute hypoxic hypercapnic respiratory failure in the setting of acute on chronic systolic/diastolic heart failure exacerbation  as well as suspected acute COPD exacerbation, as above.  Repeat CMP, CBC in the morning.  Add on procalcitonin level.                    #) Chronic tobacco abuse: Patient conveys that they are a current smoker, having smoked approximately 1-1/2 packs/day for greater than 40 years.    Plan: Counseled the patient on the importance of complete smoking discontinuation.  Order placed for prn nicotine  patch for use during this hospitalization.             DVT prophylaxis: SCD's   Code Status: Full code Family Communication: none Disposition Plan: Per Rounding Team Consults called: none;  Admission status: inpatient    I SPENT GREATER THAN 75  MINUTES IN CLINICAL CARE TIME/MEDICAL DECISION-MAKING IN COMPLETING THIS ADMISSION.     Barett Whidbee B Gina Costilla DO [facility] From 7PM - 7AM   08/29/2024, 1:19 AM

## 2024-08-29 NOTE — Plan of Care (Signed)
  Problem: Education: Goal: Knowledge of General Education information will improve Description: Including pain rating scale, medication(s)/side effects and non-pharmacologic comfort measures Outcome: Progressing   Problem: Health Behavior/Discharge Planning: Goal: Ability to manage health-related needs will improve Outcome: Progressing   Problem: Activity: Goal: Risk for activity intolerance will decrease Outcome: Progressing   Problem: Nutrition: Goal: Adequate nutrition will be maintained Outcome: Progressing   Problem: Coping: Goal: Level of anxiety will decrease Outcome: Progressing   Problem: Elimination: Goal: Will not experience complications related to bowel motility Outcome: Progressing Goal: Will not experience complications related to urinary retention Outcome: Progressing   Problem: Safety: Goal: Ability to remain free from injury will improve Outcome: Progressing

## 2024-08-29 NOTE — Progress Notes (Signed)
   08/29/24 0405  BiPAP/CPAP/SIPAP  BiPAP/CPAP/SIPAP Pt Type Adult  BiPAP/CPAP/SIPAP SERVO  Mask Type Full face mask  Dentures removed? Not applicable  Mask Size Medium  Set Rate (S)  18 breaths/min (increased due to VBG results)  Respiratory Rate 20 breaths/min  IPAP (S)  11 cmH20 (above peep.SABRASABRASABRAPressure control)  EPAP 5 cmH2O  PEEP 5 cmH20  FiO2 (%) 40 %  Flow Rate 0 lpm  Minute Ventilation 12.3  Leak 59  Peak Inspiratory Pressure (PIP) 15  Tidal Volume (Vt) 669  Patient Home Machine No  Patient Home Mask No  Patient Home Tubing No  Auto Titrate No  Press High Alarm 25 cmH2O  Device Plugged into RED Power Outlet Yes  BiPAP/CPAP /SiPAP Vitals  Pulse Rate 78  Resp 19  SpO2 93 %  MEWS Score/Color  MEWS Score 0  MEWS Score Color [place]

## 2024-08-29 NOTE — Plan of Care (Signed)

## 2024-08-29 NOTE — Progress Notes (Signed)
  Echocardiogram 2D Echocardiogram has been performed.  Stacey [name] ORN [facility] 08/29/2024, 8:23 AM

## 2024-08-29 NOTE — Progress Notes (Signed)
 [facility] Progress Note  Patient: Stacey [name]     FMW:[id number]  DOA: 08/28/2024   PCP: Rollene Almarie LABOR, MD       Brief hospital course: This is a 68 year old female with chronic systolic and diastolic heart failure, emphysema, tobacco abuse who presented to the hospital with shortness of breath.  She complained of 3 days of progressive shortness of breath, cough, wheezing, orthopnea and worsening of pedal edema. In the ED: Noted to have sinus tachycardia with heart rates in the 120s and 130s, she was in respiratory distress and  was placed on BiPAP. Lactic acid 2.2 WBC 10.9 Chest x-ray reveals distended pulmonary vasculature with interstitial prominence bilaterally Respiratory viral panel, influenza, RSV negative  Given IV Lasix , Solu-Medrol , azithromycin  and nebulizer treatments.  Admitted to the stepdown unit.  Subjective:  Has a cough that is productive. No dyspnea at rest.   Assessment and Plan: Principal Problem:   Acute hypoxic on chronic hypercapnic respiratory failure  - likely acute bronchitis with COPD exacerbation - cont steroids IVF and Nebs - add Azithromycin  - weaned off of BiPAP- on 5 L high flow [employer]- wean as able - cough meds ordered  Active Problems:   Tobacco abuse - smoking 1ppd-  encouraged to stop- wants to hold off on nicoderm patch right now    Acute on chronic combined systolic and diastolic CHF (congestive heart failure)  - cont IV aziLasix and Aldactone     Lactic acidosis - improved      Code Status: Full Code Total time on patient care: 35 min DVT prophylaxis:  SCDs Start: 08/29/24 0019     Objective:   Vitals:   08/29/24 0950 08/29/24 1000 08/29/24 1100 08/29/24 1118  BP:  (!) 121/39 (!) 110/53   Pulse: 95 90 90 (!) 114  Resp: 15 17 13 20   Temp:    98.1 F (36.7 C)  TempSrc:    Oral  SpO2: 96% 94% 93% 93%  Weight:      Height:       Filed Weights   08/29/24 0019  Weight: 95 kg   Exam: General exam:  Appears comfortable  HEENT: oral mucosa moist Respiratory system: wheezing, rhonchi and cough noted Cardiovascular system: S1 & S2 heard - tachycardic  Gastrointestinal system: Abdomen soft, non-tender, nondistended. Normal bowel sounds   Extremities: No cyanosis, clubbing or edema Psychiatry:  Mood & affect appropriate.      CBC: Recent Labs  Lab 08/28/24 1635 08/28/24 1658 08/28/24 2026 08/29/24 0305  WBC 10.9*  --   --  6.6  NEUTROABS 8.1*  --   --  6.1  HGB 17.1* 18.4* 17.7* 16.7*  HCT 52.6* 54.0* 52.0* 52.7*  MCV 101.0*  --   --  103.3*  PLT 162  --   --  163   Basic Metabolic Panel: Recent Labs  Lab 08/28/24 1635 08/28/24 1658 08/28/24 2026 08/29/24 0305  NA 138 137 139 140  K 4.0 4.4 4.2 4.4  CL 96*  --   --  98  CO2 32  --   --  30  GLUCOSE 180*  --   --  164*  BUN 12  --   --  15  CREATININE 0.79  --   --  0.72  CALCIUM 8.9  --   --  9.0  MG 1.7  --   --  3.2*  PHOS  --   --   --  4.6     Scheduled Meds:  Chlorhexidine  Gluconate Cloth  6 each Topical Daily   furosemide   40 mg Intravenous BID   ipratropium-albuterol   3 mL Nebulization Q6H   methylPREDNISolone  (SOLU-MEDROL ) injection  80 mg Intravenous Q12H   mouth rinse  15 mL Mouth Rinse 4 times per day   spironolactone   25 mg Oral Daily    Imaging and lab data personally reviewed   Author: Dorice Stiggers  08/29/2024 12:05 PM  To contact [facility]>   Check the care team in [facility] and look for the attending/consulting [facility] provider listed  Log into [email or web address] and use [place]'s universal password   Go to> [facility]  and find provider  If you still have difficulty reaching the provider, please page the [facility] (Director on Call) for the Hospitalists listed on amion

## 2024-08-29 NOTE — Progress Notes (Addendum)
 Verbal order by Earley, MD to take pt off bipap and do trial on nasal cannula for 45 minutes. If she does well order a regular diet.

## 2024-08-30 LAB — BASIC METABOLIC PANEL WITH GFR
Anion gap: 9 (ref 5–15)
BUN: 31 mg/dL — ABNORMAL HIGH (ref 8–23)
CO2: 34 mmol/L — ABNORMAL HIGH (ref 22–32)
Calcium: 9.1 mg/dL (ref 8.9–10.3)
Chloride: 97 mmol/L — ABNORMAL LOW (ref 98–111)
Creatinine, Ser: 0.92 mg/dL (ref 0.44–1.00)
GFR, Estimated: >60 mL/min (ref >60)
Glucose, Bld: 169 mg/dL — ABNORMAL HIGH (ref 70–99)
Potassium: 5 mmol/L (ref 3.5–5.1)
Sodium: 140 mmol/L (ref 135–145)

## 2024-08-30 MED ORDER — METHYLPREDNISOLONE SODIUM SUCC 40 MG IJ SOLR
40.0000 mg | Freq: Two times a day (BID) | INTRAMUSCULAR | Status: DC
  Administered 2024-08-30 – 2024-09-02 (×6): 40 mg via INTRAVENOUS
  Filled 2024-08-30 (×6): qty 1

## 2024-08-30 MED ORDER — SALINE SPRAY 0.65 % NA SOLN
1.0000 | NASAL | Status: DC | PRN
  Filled 2024-08-30 (×2): qty 44

## 2024-08-30 MED ORDER — FUROSEMIDE 40 MG PO TABS
40.0000 mg | ORAL_TABLET | Freq: Every day | ORAL | Status: DC
  Administered 2024-08-31 – 2024-09-06 (×7): 40 mg via ORAL
  Filled 2024-08-30 (×8): qty 1

## 2024-08-30 MED ORDER — HYDROXYZINE HCL 10 MG PO TABS
10.0000 mg | ORAL_TABLET | Freq: Once | ORAL | Status: AC | PRN
  Administered 2024-08-30: 10 mg via ORAL
  Filled 2024-08-30: qty 1

## 2024-08-30 NOTE — Progress Notes (Signed)
 SATURATION QUALIFICATIONS: (This note is used to comply with regulatory documentation for home oxygen)  Patient Saturations on 5L at Rest = 94%  Patient Saturations on 5L while Ambulating = 87%  Please briefly explain why patient needs home oxygen:   Patient needs supplemental oxygen to maintain their oxygen saturation.

## 2024-08-30 NOTE — Progress Notes (Signed)
 Patient's HR sustaining >140-160 for at least 3 hours. Patient verbalized anxiety about overall condition, smoking history, needing oxygen, and wearing BIPAP/CPAP. Patient received anti-anxiety medication as prescribed. Nursing staff provided active listening, therapeutic communication, and non-pharmaceutical methods to reduce anxiety. Those interventions have not successfully decreased HR or anxiety. Bedside RN to contact provider for additional recommendations.

## 2024-08-30 NOTE — Progress Notes (Addendum)
 [facility] Progress Note  Patient: Stacey [name]     FMW:[id number]  DOA: 08/28/2024   PCP: Rollene Almarie LABOR, MD       Brief hospital course: This is a 68 year old female with chronic systolic and diastolic heart failure, emphysema, tobacco abuse who presented to the hospital with shortness of breath.  She complained of 3 days of progressive shortness of breath, cough, wheezing, orthopnea and worsening of pedal edema. In the ED: Noted to have sinus tachycardia with heart rates in the 120s and 130s, she was in respiratory distress and  was placed on BiPAP. Lactic acid 2.2 WBC 10.9 Chest x-ray reveals distended pulmonary vasculature with interstitial prominence bilaterally Respiratory viral panel, influenza, RSV negative  Given IV Lasix , Solu-Medrol , azithromycin  and nebulizer treatments.  Admitted to the stepdown unit.  Subjective:  Cough is better than yesterday but not resolved.  Has not ambulated yet.  Assessment and Plan: Principal Problem:   Acute hypoxic on chronic hypercapnic respiratory failure  - likely acute bronchitis with COPD exacerbation - cont steroids IV and Nebs - 12/8- added Azithromycin   - on 5 L high flow [place]-attempted to wean down however pulse ox dropped to 83% at rest - Follows with Dr. Neda -outpatient pulmonary function tests were being scheduled for her  Active Problems:   Tobacco abuse - smoking 1ppd-  encouraged to stop- wants to hold off on nicoderm patch right now    Acute on chronic combined systolic and diastolic CHF (congestive heart failure)  - Can change IV Lasix  to oral today - Continue Aldactone   Polycythemia - ? If due to chronic hypoxia- she states that a cardiology PA once checked her pulse ox in the office and it was in the 80s- She does not use have O2 prescribed, however    Lactic acidosis - improved  Sleep apnea       Code Status: Full Code Total time on patient care: 35 min DVT prophylaxis:  SCDs Start:  08/29/24 0019     Objective:   Vitals:   08/30/24 0500 08/30/24 0614 08/30/24 0700 08/30/24 0730  BP: 133/61  136/65   Pulse: 77  91   Resp: 11  14   Temp:    98.5 F (36.9 C)  TempSrc:    Oral  SpO2: 97%  90%   Weight:  95.5 kg    Height:       Filed Weights   08/29/24 0019 08/30/24 0614  Weight: 95 kg 95.5 kg   Exam: General exam: Appears comfortable  HEENT: oral mucosa moist Respiratory system: Mild rhonchi bilaterally, continued cough, poor air movement Cardiovascular system: S1 & S2 heard   Gastrointestinal system: Abdomen soft, non-tender, nondistended. Normal bowel sounds   Extremities: No cyanosis, clubbing or edema Psychiatry:  Mood & affect appropriate.      CBC: Recent Labs  Lab 08/28/24 1635 08/28/24 1658 08/28/24 2026 08/29/24 0305  WBC 10.9*  --   --  6.6  NEUTROABS 8.1*  --   --  6.1  HGB 17.1* 18.4* 17.7* 16.7*  HCT 52.6* 54.0* 52.0* 52.7*  MCV 101.0*  --   --  103.3*  PLT 162  --   --  163   Basic Metabolic Panel: Recent Labs  Lab 08/28/24 1635 08/28/24 1658 08/28/24 2026 08/29/24 0305 08/30/24 0306  NA 138 137 139 140 140  K 4.0 4.4 4.2 4.4 5.0  CL 96*  --   --  98 97*  CO2 32  --   --  30 34*  GLUCOSE 180*  --   --  164* 169*  BUN 12  --   --  15 31*  CREATININE 0.79  --   --  0.72 0.92  CALCIUM 8.9  --   --  9.0 9.1  MG 1.7  --   --  3.2*  --   PHOS  --   --   --  4.6  --      Scheduled Meds:  azithromycin   250 mg Oral q1800   benzonatate   200 mg Oral TID   Chlorhexidine  Gluconate Cloth  6 each Topical Daily   dextromethorphan -guaiFENesin   1 tablet Oral BID   furosemide   40 mg Intravenous BID   ipratropium-albuterol   3 mL Nebulization Q6H   methylPREDNISolone  (SOLU-MEDROL ) injection  80 mg Intravenous Q12H   mouth rinse  15 mL Mouth Rinse 4 times per day   spironolactone   25 mg Oral Daily    Imaging and lab data personally reviewed   Author: Sharni Negron  08/30/2024 9:49 AM  To contact [facility]>    Check the care team in [facility] and look for the attending/consulting [facility] provider listed  Log into [email or web address] and use [place]'s universal password   Go to> [facility]  and find provider  If you still have difficulty reaching the provider, please page the [facility] (Director on Call) for the Hospitalists listed on amion

## 2024-08-30 NOTE — Progress Notes (Signed)
   08/30/24 2014  BiPAP/CPAP/SIPAP  BiPAP/CPAP/SIPAP Pt Type Adult  Reason BIPAP/CPAP not in use Non-compliant (pt currently refusing cpap for the night. RT made pt aware if she cahnged her mind to let us  know.)

## 2024-08-30 NOTE — Plan of Care (Signed)

## 2024-08-31 ENCOUNTER — Inpatient Hospital Stay (HOSPITAL_COMMUNITY)

## 2024-08-31 LAB — CBC
HCT: 51.7 % — ABNORMAL HIGH (ref 36.0–46.0)
Hemoglobin: 16.3 g/dL — ABNORMAL HIGH (ref 12.0–15.0)
MCH: 32.8 pg (ref 26.0–34.0)
MCHC: 31.5 g/dL (ref 30.0–36.0)
MCV: 104 fL — ABNORMAL HIGH (ref 80.0–100.0)
Platelets: 183 K/uL (ref 150–400)
RBC: 4.97 MIL/uL (ref 3.87–5.11)
RDW: 13.8 % (ref 11.5–15.5)
WBC: 11.1 K/uL — ABNORMAL HIGH (ref 4.0–10.5)
nRBC: 0 % (ref 0.0–0.2)

## 2024-08-31 LAB — BASIC METABOLIC PANEL WITH GFR
Anion gap: 5 (ref 5–15)
BUN: 34 mg/dL — ABNORMAL HIGH (ref 8–23)
CO2: 40 mmol/L — ABNORMAL HIGH (ref 22–32)
Calcium: 9 mg/dL (ref 8.9–10.3)
Chloride: 98 mmol/L (ref 98–111)
Creatinine, Ser: 0.62 mg/dL (ref 0.44–1.00)
GFR, Estimated: >60 mL/min (ref >60)
Glucose, Bld: 96 mg/dL (ref 70–99)
Potassium: 4.2 mmol/L (ref 3.5–5.1)
Sodium: 142 mmol/L (ref 135–145)

## 2024-08-31 LAB — MAGNESIUM: Magnesium: 2.6 mg/dL — ABNORMAL HIGH (ref 1.7–2.4)

## 2024-08-31 NOTE — TOC Initial Note (Signed)
 Transition of Care [facility]) - Initial/Assessment Note    Patient Details  Name: Stacey [name] MRN: [id number] Date of Birth: [birth year]  [facility]) CM/SW Contact:    Stacey ONEIDA Anon, RN Phone Number: 08/31/2024, 4:06 PM  Clinical Narrative:                 Pt is from home. Pt presented to the ED with complaints of cough/ congestion for 3 days and SOB. Pt requiring 7L O2 HFNC at this time. Pt does not wear home O2 at baseline. Potential need for home O2 at discharge. ICM following for any DC planning needs.  Expected Discharge Plan: Home/Self Care Barriers to Discharge: Continued Medical Work up   Patient Goals and CMS Choice Patient states their goals for this hospitalization and ongoing recovery are:: Home CMS Medicare.gov Compare Post Acute Care list provided to:: Patient Choice offered to / list presented to : Patient [place] ownership interest in [facility].provided to:: Patient    Expected Discharge Plan and Services In-house Referral: NA Discharge Planning Services: CM Consult Post Acute Care Choice: Durable Medical Equipment Living arrangements for the past 2 months: Single Family Home                                      Prior Living Arrangements/Services Living arrangements for the past 2 months: Single Family Home Lives with:: Spouse Patient language and need for interpreter reviewed:: Yes Do you feel safe going back to the place where you live?: Yes      Need for Family Participation in Patient Care: Yes (Comment) Care giver support system in place?: Yes (comment) Current home services: Other (comment) (None) Criminal Activity/Legal Involvement Pertinent to Current Situation/Hospitalization: No - Comment as needed  Activities of Daily Living   ADL Screening (condition at time of admission) Independently performs ADLs?: Yes (appropriate for developmental age) Is the patient deaf or have difficulty hearing?: No Does the  patient have difficulty seeing, even when wearing glasses/contacts?: No Does the patient have difficulty concentrating, remembering, or making decisions?: No  Permission Sought/Granted Permission sought to share information with : Family Supports    Share Information with NAME: Stacey [name] (Spouse)  [phone]           Emotional Assessment Appearance:: Appears stated age Attitude/Demeanor/Rapport: Unable to Assess Affect (typically observed): Unable to Assess   Alcohol / Substance Use: Tobacco Use Psych Involvement: No (comment)  Admission diagnosis:  Acute respiratory failure (HCC) [J96.00] Hypercapnia [R06.89] Hypoxia [R09.02] COPD exacerbation (HCC) [J44.1] Patient Active Problem List   Diagnosis Date Noted   SOB (shortness of breath) 08/29/2024   Acute on chronic combined systolic and diastolic CHF (congestive heart failure) (HCC) 08/29/2024   COPD with acute exacerbation (HCC) 08/29/2024   Lactic acidosis 08/29/2024   Acute hypoxic on chronic hypercapnic respiratory failure (HCC) 08/28/2024   Pulmonary hypertension (HCC) 03/29/2024   Obstructive sleep apnea 01/20/2024   Panic disorder 01/04/2024   Emphysema lung (HCC) 01/04/2024   Aortic atherosclerosis 01/04/2024   Hypoxia 03/07/2022   Arthralgia 08/28/2021   Osteoporosis 12/31/2020   Anxiety 12/31/2020   Right shoulder pain 06/23/2020   Prediabetes 06/13/2020   Lump in neck 04/04/2019   B12 deficiency 10/28/2017   Vitamin D  deficiency 10/28/2017   Routine general medical examination at a health care facility 01/15/2016   Migraines 01/15/2016   History of left breast cancer 09/05/2010  Tobacco abuse 09/05/2010   PCP:  Rollene Almarie LABOR, MD Pharmacy:   [facility] [place],  - [address] [place] [address] Phone: [phone] Fax: [phone]     Social Drivers of Health (SDOH) Social History: SDOH Screenings   Food Insecurity: No Food  Insecurity (08/29/2024)  Housing: Unknown (08/29/2024)  Transportation Needs: No Transportation Needs (08/29/2024)  Utilities: Not At Risk (08/29/2024)  Alcohol Screen: Low Risk  (03/17/2024)  Depression (PHQ2-9): Low Risk  (03/29/2024)  Financial Resource Strain: Low Risk  (03/17/2024)  Physical Activity: Inactive (03/17/2024)  Social Connections: Moderately Isolated (08/29/2024)  Stress: Stress Concern Present (03/17/2024)  Tobacco Use: High Risk (08/29/2024)  Health Literacy: Adequate Health Literacy (03/17/2024)   SDOH Interventions: Housing Interventions: Intervention Not Indicated Utilities Interventions: Patient Declined   Readmission Risk Interventions    08/31/2024    4:01 PM  Readmission Risk Prevention Plan  Post Dischage Appt Complete  Medication Screening Complete  Transportation Screening Complete

## 2024-08-31 NOTE — Plan of Care (Signed)
°  Problem: Health Behavior/Discharge Planning: Goal: Ability to manage health-related needs will improve Outcome: Progressing   Problem: Clinical Measurements: Goal: Will remain free from infection Outcome: Progressing   Problem: Activity: Goal: Risk for activity intolerance will decrease Outcome: Progressing   Problem: Nutrition: Goal: Adequate nutrition will be maintained Outcome: Progressing   Problem: Coping: Goal: Level of anxiety will decrease Outcome: Progressing   Problem: Elimination: Goal: Will not experience complications related to urinary retention Outcome: Progressing

## 2024-08-31 NOTE — Progress Notes (Signed)
 [facility] Progress Note Patient: Stacey [name] FMW:[id number] DOB: [birth year]  DOA: 08/28/2024 DOS: the patient was seen and examined on 08/31/2024  Brief Hospital Course: Patient with PMH of chronic mild CHF, COPD, active smoker, obesity, breast cancer, OSA presented to hospital with complaints of cough and shortness of breath. Currently found to have acute COPD exacerbation with acute hypoxic respiratory failure. Assessment and Plan: Acute hypoxic respiratory failure. Acute COPD exacerbation secondary to bronchitis. Presents with complaints of cough and shortness of breath. Currently on 7 L of oxygen.  Saturation drops to 88% on 5 L. Chest x-ray shows no acute abnormality. Bilateral expiratory wheezes heard although improving. For now continue with steroids, nebulizer therapy, azithromycin . Monitor for now.  Acute on chronic hypercarbic respiratory failure. OSA. Obesity. Body mass index is 32.62 kg/m.  Patient had a sleep study outpatient at which patient was hypoxic. Time spent <=88% oxygen saturation was 64. 7 minutes ( 16. 0% ) . Was recommended CPAP titration. ABG shows pCO2 of 70s. BiPAP is ordered for as needed.  Cannot tolerate it for now. Continue on nocturnal oxygen upon discharge.  Acute on chronic combined CHF. Echocardiogram this admission shows improvement in EF 65%. Echocardiogram in June 2024 EF 45 to 50%. Grade 1 diastolic dysfunction. No significant valvular pathology. Treated with IV Lasix  earlier. Currently on oral Lasix . On aspirin, Aldactone .  Resume aspirin.  Holding Aldactone  due to hyperkalemia.  Active smoker. Smokes 1 pack a day. Follows up with pulmonary outpatient. Wants to quit.  Macrocytosis. Polycythemia Likely related to vitamin B12 deficiency. Continue supplementation. Polycythemia mostly in the setting of active smoking. Outpatient follow-up   Subjective: Shows improvement in breathing.  No nausea no vomiting.  No chest  pain.  Continues to have some cough.  Unable to tolerate BiPAP last night.  Physical Exam: General: in Mild distress, No Rash Cardiovascular: S1 and S2 Present, No Murmur Respiratory: Good respiratory effort, Bilateral Air entry present.  Faint basal crackles, bilateral expiratory wheezes Abdomen: Bowel Sound present, No tenderness Extremities: No edema Neuro: Alert and oriented x3, no new focal deficit   Data Reviewed: I have Reviewed nursing notes, Vitals, and Lab results. Since last encounter, pertinent lab results CBC and BMP   . I have ordered test including BMP and magnesium   .   Disposition: Status is: Inpatient Remains inpatient appropriate because: Monitor for improvement in respiratory status  SCDs Start: 08/29/24 0019  Family Communication: Family at bedside Level of care: Stepdown continue for today Vitals:   08/31/24 1150 08/31/24 1222 08/31/24 1408 08/31/24 1500  BP:  119/76 136/62   Pulse:  (!) 112 (!) 120 98  Resp:  (!) 21 16 16   Temp: (!) 97.4 F (36.3 C)     TempSrc: Oral     SpO2:  (!) 84% (!) 85% 95%  Weight:      Height:         Author: Yetta Blanch, MD 08/31/2024 4:10 PM  Please look on [email or web address] to find out who is on call.

## 2024-08-31 NOTE — Hospital Course (Addendum)
 Patient with PMH of chronic mild CHF, COPD, active smoker, obesity, breast cancer, OSA presented to hospital with complaints of cough and shortness of breath. Currently found to have acute COPD exacerbation with acute hypoxic respiratory failure. Assessment and Plan: Acute hypoxic respiratory failure. Acute COPD exacerbation secondary to bronchitis. Small pneumothorax Presents with complaints of cough and shortness of breath. Chest x-ray shows no acute abnormality. Continuing steroids and nebulizer and antibiotic. Switching from IV to oral steroids. Oxygenation was improving but on 12/13 worsen again. A CT PE protocol was performed which shows evidence of a small pneumothorax on the left. No PE. Pulmonary consulted.  Currently treating conservatively with hyperoxygenation and incentive spirometry. Appreciate pulmonary consultation currently initiating sodium chloride  nebulization therapy, budesonide , Brovana  and Yupelri .  Avoiding NIPPV.  Acute on chronic hypercarbic respiratory failure. OSA. Obesity. Body mass index is 31.78 kg/m.  Patient had a sleep study outpatient at which patient was hypoxic. Time spent <=88% oxygen saturation was 64. 7 minutes ( 16. 0% ) . Was recommended CPAP titration. ABG shows pCO2 of 70s. BiPAP is ordered for as needed.  Cannot tolerate it for now. Unable to complete CPAP titration. Continue on nocturnal oxygen upon discharge.  Acute on chronic combined CHF. Echocardiogram this admission shows improvement in EF 65%. Echocardiogram in June 2024 EF 45 to 50%. Grade 1 diastolic dysfunction. No significant valvular pathology. Treated with IV Lasix  earlier. Currently on oral Lasix . On aspirin, Aldactone .  Resume aspirin. Aldactone  was held due to hyperkalemia.  Now at present holding due to normal blood pressure.  Active smoker. Smokes 1 pack a day. Follows up with pulmonary outpatient. Wants to quit.  Macrocytosis. Polycythemia Likely related to  vitamin B12 deficiency.  Which is chronic. Continue supplementation. Polycythemia mostly in the setting of active smoking. Outpatient follow-up

## 2024-08-31 NOTE — Plan of Care (Signed)
  Problem: Clinical Measurements: Goal: Respiratory complications will improve Outcome: Progressing   Problem: Activity: Goal: Risk for activity intolerance will decrease Outcome: Progressing   Problem: Nutrition: Goal: Adequate nutrition will be maintained Outcome: Progressing   Problem: Elimination: Goal: Will not experience complications related to urinary retention Outcome: Progressing   

## 2024-09-01 LAB — MAGNESIUM: Magnesium: 2.2 mg/dL (ref 1.7–2.4)

## 2024-09-01 LAB — BASIC METABOLIC PANEL WITH GFR
Anion gap: 10 (ref 5–15)
BUN: 33 mg/dL — ABNORMAL HIGH (ref 8–23)
CO2: 36 mmol/L — ABNORMAL HIGH (ref 22–32)
Calcium: 9.1 mg/dL (ref 8.9–10.3)
Chloride: 95 mmol/L — ABNORMAL LOW (ref 98–111)
Creatinine, Ser: 0.71 mg/dL (ref 0.44–1.00)
GFR, Estimated: >60 mL/min (ref >60)
Glucose, Bld: 135 mg/dL — ABNORMAL HIGH (ref 70–99)
Potassium: 4.2 mmol/L (ref 3.5–5.1)
Sodium: 142 mmol/L (ref 135–145)

## 2024-09-01 MED ORDER — DEXTROMETHORPHAN POLISTIREX ER 30 MG/5ML PO SUER
30.0000 mg | Freq: Two times a day (BID) | ORAL | Status: DC
  Administered 2024-09-01 – 2024-09-07 (×13): 30 mg via ORAL
  Filled 2024-09-01 (×14): qty 5

## 2024-09-01 MED ORDER — ENOXAPARIN SODIUM 40 MG/0.4ML IJ SOSY
40.0000 mg | PREFILLED_SYRINGE | INTRAMUSCULAR | Status: DC
  Administered 2024-09-01 – 2024-09-07 (×7): 40 mg via SUBCUTANEOUS
  Filled 2024-09-01 (×7): qty 0.4

## 2024-09-01 MED ORDER — GUAIFENESIN ER 600 MG PO TB12
600.0000 mg | ORAL_TABLET | Freq: Two times a day (BID) | ORAL | Status: DC
  Administered 2024-09-01 – 2024-09-07 (×13): 600 mg via ORAL
  Filled 2024-09-01 (×13): qty 1

## 2024-09-01 MED ORDER — MAGIC MOUTHWASH
5.0000 mL | Freq: Four times a day (QID) | ORAL | Status: DC
  Administered 2024-09-01 – 2024-09-07 (×22): 5 mL via ORAL
  Filled 2024-09-01 (×25): qty 5

## 2024-09-01 MED FILL — Lorazepam Tab 0.5 MG: 0.5000 mg | ORAL | Qty: 1 | Status: CN

## 2024-09-01 MED FILL — Lorazepam Tab 0.5 MG: 0.5000 mg | ORAL | Qty: 1 | Status: AC

## 2024-09-01 NOTE — Plan of Care (Signed)
  Problem: Education: Goal: Knowledge of General Education information will improve Description: Including pain rating scale, medication(s)/side effects and non-pharmacologic comfort measures Outcome: Progressing   Problem: Clinical Measurements: Goal: Ability to maintain clinical measurements within normal limits will improve Outcome: Progressing Goal: Respiratory complications will improve Outcome: Progressing   Problem: Activity: Goal: Risk for activity intolerance will decrease Outcome: Progressing   Problem: Nutrition: Goal: Adequate nutrition will be maintained Outcome: Progressing   Problem: Coping: Goal: Level of anxiety will decrease Outcome: Progressing   Problem: Safety: Goal: Ability to remain free from injury will improve Outcome: Progressing

## 2024-09-01 NOTE — Plan of Care (Signed)

## 2024-09-01 NOTE — Progress Notes (Signed)
 [facility] Progress Note Patient: Stacey [name] FMW:[id number] DOB: 1955-11-11  DOA: 08/28/2024 DOS: the patient was seen and examined on 09/01/2024  Brief Hospital Course: Patient with PMH of chronic mild CHF, COPD, active smoker, obesity, breast cancer, OSA presented to hospital with complaints of cough and shortness of breath. Currently found to have acute COPD exacerbation with acute hypoxic respiratory failure. Assessment and Plan: Acute hypoxic respiratory failure. Acute COPD exacerbation secondary to bronchitis. Presents with complaints of cough and shortness of breath. Currently on 5 L of oxygen.  Saturation at 89%. Chest x-ray shows no acute abnormality. Bilateral expiratory wheezes heard although improving. For now continue with steroids, nebulizer therapy, azithromycin . Monitor for now.  Transfer out of the stepdown unit.  Acute on chronic hypercarbic respiratory failure. OSA. Obesity. Body mass index is 32.62 kg/m.  Patient had a sleep study outpatient at which patient was hypoxic. Time spent <=88% oxygen saturation was 64. 7 minutes ( 16. 0% ) . Was recommended CPAP titration. ABG shows pCO2 of 70s. BiPAP is ordered for as needed.  Cannot tolerate it for now. Continue CPAP nightly. Continue on nocturnal oxygen upon discharge.  Acute on chronic combined CHF. Echocardiogram this admission shows improvement in EF 65%. Echocardiogram in June 2024 EF 45 to 50%. Grade 1 diastolic dysfunction. No significant valvular pathology. Treated with IV Lasix  earlier. Currently on oral Lasix . On aspirin, Aldactone .  Resume aspirin.  Holding Aldactone  due to hyperkalemia.  Active smoker. Smokes 1 pack a day. Follows up with pulmonary outpatient. Wants to quit.  Macrocytosis. Polycythemia Likely related to vitamin B12 deficiency. Continue supplementation. Polycythemia mostly in the setting of active smoking. Outpatient follow-up   Subjective: No nausea no vomiting.   Ongoing cough and fatigue.  Reports that she has been cranky.  Physical Exam: Bilateral expiratory wheezes. S1-S2 present Bowel sounds are Basilar crackles. No edema.  No thrush.  Data Reviewed: I have Reviewed nursing notes, Vitals, and Lab results. Since last encounter, pertinent lab results CBC and BMP   . I have ordered test including CBC and BMP  .   Disposition: Status is: Inpatient Remains inpatient appropriate because: Monitor for improvement in oxygenation  enoxaparin (LOVENOX) injection 40 mg Start: 09/01/24 1000 SCDs Start: 08/29/24 0019   Family Communication: Family at bedside Level of care: Telemetry switching from stepdown to telemetry Vitals:   09/01/24 1100 09/01/24 1235 09/01/24 1402 09/01/24 1721  BP:  (!) 110/92  129/78  Pulse: (!) 107 (!) 103  98  Resp: 15 20  (!) 22  Temp:  98.2 F (36.8 C)  97.7 F (36.5 C)  TempSrc:    Oral  SpO2: (!) 89% 91% 90% 94%  Weight:      Height:         Author: Yetta Blanch, MD 09/01/2024 6:27 PM  Please look on [email or web address] to find out who is on call.

## 2024-09-02 LAB — BASIC METABOLIC PANEL WITH GFR
Anion gap: 9 (ref 5–15)
BUN: 29 mg/dL — ABNORMAL HIGH (ref 8–23)
CO2: 37 mmol/L — ABNORMAL HIGH (ref 22–32)
Calcium: 9.1 mg/dL (ref 8.9–10.3)
Chloride: 94 mmol/L — ABNORMAL LOW (ref 98–111)
Creatinine, Ser: 0.69 mg/dL (ref 0.44–1.00)
GFR, Estimated: >60 mL/min (ref >60)
Glucose, Bld: 143 mg/dL — ABNORMAL HIGH (ref 70–99)
Potassium: 4.1 mmol/L (ref 3.5–5.1)
Sodium: 140 mmol/L (ref 135–145)

## 2024-09-02 LAB — CULTURE, BLOOD (ROUTINE X 2)
Culture: NO GROWTH
Culture: NO GROWTH

## 2024-09-02 LAB — CBC
HCT: 51.5 % — ABNORMAL HIGH (ref 36.0–46.0)
Hemoglobin: 16.5 g/dL — ABNORMAL HIGH (ref 12.0–15.0)
MCH: 32.9 pg (ref 26.0–34.0)
MCHC: 32 g/dL (ref 30.0–36.0)
MCV: 102.6 fL — ABNORMAL HIGH (ref 80.0–100.0)
Platelets: 185 K/uL (ref 150–400)
RBC: 5.02 MIL/uL (ref 3.87–5.11)
RDW: 13.5 % (ref 11.5–15.5)
WBC: 8.4 K/uL (ref 4.0–10.5)
nRBC: 0 % (ref 0.0–0.2)

## 2024-09-02 LAB — MAGNESIUM: Magnesium: 2.4 mg/dL (ref 1.7–2.4)

## 2024-09-02 MED ORDER — IPRATROPIUM-ALBUTEROL 0.5-2.5 (3) MG/3ML IN SOLN
3.0000 mL | Freq: Three times a day (TID) | RESPIRATORY_TRACT | Status: DC
  Administered 2024-09-02 – 2024-09-04 (×7): 3 mL via RESPIRATORY_TRACT
  Filled 2024-09-02 (×7): qty 3

## 2024-09-02 MED ORDER — PREDNISONE 50 MG PO TABS
50.0000 mg | ORAL_TABLET | Freq: Every day | ORAL | Status: DC
  Administered 2024-09-03: 50 mg via ORAL
  Filled 2024-09-02: qty 1

## 2024-09-02 MED ORDER — VITAMIN B-12 1000 MCG PO TABS
1000.0000 ug | ORAL_TABLET | Freq: Every day | ORAL | Status: DC
  Administered 2024-09-02 – 2024-09-07 (×6): 1000 ug via ORAL
  Filled 2024-09-02 (×6): qty 1

## 2024-09-02 NOTE — Progress Notes (Signed)
 [facility] Progress Note Patient: Stacey [name] FMW:[id number] DOB: [birth year]  DOA: 08/28/2024 DOS: the patient was seen and examined on 09/02/2024  Brief Hospital Course: Patient with PMH of chronic mild CHF, COPD, active smoker, obesity, breast cancer, OSA presented to hospital with complaints of cough and shortness of breath. Currently found to have acute COPD exacerbation with acute hypoxic respiratory failure. Assessment and Plan: Acute hypoxic respiratory failure. Acute COPD exacerbation secondary to bronchitis. Presents with complaints of cough and shortness of breath. Oxygenation improving.  Currently on 3 LPM at rest but required more than 4 L upon exertion and saturation still dropped to 82%. Chest x-ray shows no acute abnormality. Improving examination. Continuing steroids and nebulizer and antibiotic. Switching from IV to oral steroids.  Acute on chronic hypercarbic respiratory failure. OSA. Obesity. Body mass index is 31.78 kg/m.  Patient had a sleep study outpatient at which patient was hypoxic. Time spent <=88% oxygen saturation was 64. 7 minutes ( 16. 0% ) . Was recommended CPAP titration. ABG shows pCO2 of 70s. BiPAP is ordered for as needed.  Cannot tolerate it for now. Unable to complete CPAP titration. Continue on nocturnal oxygen upon discharge.  Acute on chronic combined CHF. Echocardiogram this admission shows improvement in EF 65%. Echocardiogram in June 2024 EF 45 to 50%. Grade 1 diastolic dysfunction. No significant valvular pathology. Treated with IV Lasix  earlier. Currently on oral Lasix . On aspirin, Aldactone .  Resume aspirin. Aldactone  was held due to hyperkalemia.  Now at present holding due to normal blood pressure.  Active smoker. Smokes 1 pack a day. Follows up with pulmonary outpatient. Wants to quit.  Macrocytosis. Polycythemia Likely related to vitamin B12 deficiency.  Which is chronic. Continue supplementation. Polycythemia  mostly in the setting of active smoking. Outpatient follow-up   Subjective: No nausea no vomiting no fever no chills.  Breathing improving.  Physical Exam: Faint basilar crackles. S1-S2 present  bowel sound present No edema. No thrush.  Data Reviewed: I have Reviewed nursing notes, Vitals, and Lab results. Since last encounter, pertinent lab results CBC and BMP   .   Disposition: Status is: Inpatient Remains inpatient appropriate because: Monitor for improvement in oxygenation  enoxaparin (LOVENOX) injection 40 mg Start: 09/01/24 1000 SCDs Start: 08/29/24 0019   Family Communication: Husband at bedside Level of care: Telemetry   Vitals:   09/02/24 0131 09/02/24 0405 09/02/24 0604 09/02/24 1503  BP: 127/78  130/82 125/64  Pulse: 90  89 100  Resp: 20  18   Temp: 98.9 F (37.2 C)   97.9 F (36.6 C)  TempSrc: Oral   Oral  SpO2: 91%  93% 90%  Weight:  94.8 kg    Height:         Author: Yetta Blanch, MD 09/02/2024 5:23 PM  Please look on [email or web address] to find out who is on call.

## 2024-09-02 NOTE — Plan of Care (Signed)

## 2024-09-02 NOTE — Progress Notes (Signed)
°   09/02/24 0029  BiPAP/CPAP/SIPAP  BiPAP/CPAP/SIPAP Pt Type Adult  Reason BIPAP/CPAP not in use Non-compliant (pt refusing)

## 2024-09-03 ENCOUNTER — Inpatient Hospital Stay (HOSPITAL_COMMUNITY)

## 2024-09-03 DIAGNOSIS — G4733 Obstructive sleep apnea (adult) (pediatric): Secondary | ICD-10-CM

## 2024-09-03 DIAGNOSIS — F17210 Nicotine dependence, cigarettes, uncomplicated: Secondary | ICD-10-CM

## 2024-09-03 DIAGNOSIS — J9612 Chronic respiratory failure with hypercapnia: Secondary | ICD-10-CM | POA: Diagnosis not present

## 2024-09-03 DIAGNOSIS — J9601 Acute respiratory failure with hypoxia: Secondary | ICD-10-CM | POA: Diagnosis not present

## 2024-09-03 DIAGNOSIS — I5033 Acute on chronic diastolic (congestive) heart failure: Secondary | ICD-10-CM

## 2024-09-03 DIAGNOSIS — J9811 Atelectasis: Secondary | ICD-10-CM

## 2024-09-03 DIAGNOSIS — J9312 Secondary spontaneous pneumothorax: Secondary | ICD-10-CM

## 2024-09-03 LAB — PRO BRAIN NATRIURETIC PEPTIDE: Pro Brain Natriuretic Peptide: <50 pg/mL (ref <300.0)

## 2024-09-03 LAB — BASIC METABOLIC PANEL WITH GFR
Anion gap: 7 (ref 5–15)
BUN: 27 mg/dL — ABNORMAL HIGH (ref 8–23)
CO2: 39 mmol/L — ABNORMAL HIGH (ref 22–32)
Calcium: 9.4 mg/dL (ref 8.9–10.3)
Chloride: 94 mmol/L — ABNORMAL LOW (ref 98–111)
Creatinine, Ser: 0.99 mg/dL (ref 0.44–1.00)
GFR, Estimated: >60 mL/min (ref >60)
Glucose, Bld: 130 mg/dL — ABNORMAL HIGH (ref 70–99)
Potassium: 3.9 mmol/L (ref 3.5–5.1)
Sodium: 140 mmol/L (ref 135–145)

## 2024-09-03 LAB — MAGNESIUM: Magnesium: 2.4 mg/dL (ref 1.7–2.4)

## 2024-09-03 MED ORDER — ARFORMOTEROL TARTRATE 15 MCG/2ML IN NEBU
15.0000 ug | INHALATION_SOLUTION | Freq: Two times a day (BID) | RESPIRATORY_TRACT | Status: DC
  Administered 2024-09-03 – 2024-09-07 (×8): 15 ug via RESPIRATORY_TRACT
  Filled 2024-09-03 (×8): qty 2

## 2024-09-03 MED ORDER — IOHEXOL 350 MG/ML SOLN
75.0000 mL | Freq: Once | INTRAVENOUS | Status: AC | PRN
  Administered 2024-09-03: 75 mL via INTRAVENOUS

## 2024-09-03 MED ORDER — REVEFENACIN 175 MCG/3ML IN SOLN
175.0000 ug | Freq: Every day | RESPIRATORY_TRACT | Status: DC
  Administered 2024-09-04 – 2024-09-07 (×4): 175 ug via RESPIRATORY_TRACT
  Filled 2024-09-03 (×4): qty 3

## 2024-09-03 MED ORDER — BUDESONIDE 0.25 MG/2ML IN SUSP
0.2500 mg | Freq: Two times a day (BID) | RESPIRATORY_TRACT | Status: DC
  Administered 2024-09-03 – 2024-09-07 (×8): 0.25 mg via RESPIRATORY_TRACT
  Filled 2024-09-03 (×8): qty 2

## 2024-09-03 MED ORDER — METHYLPREDNISOLONE SODIUM SUCC 40 MG IJ SOLR
40.0000 mg | Freq: Two times a day (BID) | INTRAMUSCULAR | Status: DC
  Administered 2024-09-03 – 2024-09-05 (×5): 40 mg via INTRAVENOUS
  Filled 2024-09-03 (×5): qty 1

## 2024-09-03 MED ORDER — SODIUM CHLORIDE 3 % IN NEBU
4.0000 mL | INHALATION_SOLUTION | Freq: Three times a day (TID) | RESPIRATORY_TRACT | Status: AC
  Administered 2024-09-04 – 2024-09-06 (×7): 4 mL via RESPIRATORY_TRACT
  Filled 2024-09-03 (×9): qty 4

## 2024-09-03 MED ADMIN — Lorazepam Tab 0.5 MG: 0.5 mg | ORAL | NDC 69315090401

## 2024-09-03 NOTE — Plan of Care (Signed)

## 2024-09-03 NOTE — Progress Notes (Signed)
 [facility] Progress Note Patient: Stacey [name] FMW:[id number] DOB: [birth year]  DOA: 08/28/2024 DOS: the patient was seen and examined on 09/03/2024  Brief Hospital Course: Patient with PMH of chronic mild CHF, COPD, active smoker, obesity, breast cancer, OSA presented to hospital with complaints of cough and shortness of breath. Currently found to have acute COPD exacerbation with acute hypoxic respiratory failure. Assessment and Plan: Acute hypoxic respiratory failure. Acute COPD exacerbation secondary to bronchitis. Small pneumothorax Presents with complaints of cough and shortness of breath. Chest x-ray shows no acute abnormality. Continuing steroids and nebulizer and antibiotic. Switching from IV to oral steroids. Oxygenation was improving but on 12/13 worsen again. A CT PE protocol was performed which shows evidence of a small pneumothorax on the left. No PE. Pulmonary consulted.  Currently treating conservatively with hyperoxygenation and incentive spirometry. Appreciate pulmonary consultation currently initiating sodium chloride  nebulization therapy, budesonide , Brovana  and Yupelri .  Avoiding NIPPV.  Acute on chronic hypercarbic respiratory failure. OSA. Obesity. Body mass index is 31.78 kg/m.  Patient had a sleep study outpatient at which patient was hypoxic. Time spent <=88% oxygen saturation was 64. 7 minutes ( 16. 0% ) . Was recommended CPAP titration. ABG shows pCO2 of 70s. BiPAP is ordered for as needed.  Cannot tolerate it for now. Unable to complete CPAP titration. Continue on nocturnal oxygen upon discharge.  Acute on chronic combined CHF. Echocardiogram this admission shows improvement in EF 65%. Echocardiogram in June 2024 EF 45 to 50%. Grade 1 diastolic dysfunction. No significant valvular pathology. Treated with IV Lasix  earlier. Currently on oral Lasix . On aspirin, Aldactone .  Resume aspirin. Aldactone  was held due to hyperkalemia.  Now at present  holding due to normal blood pressure.  Active smoker. Smokes 1 pack a day. Follows up with pulmonary outpatient. Wants to quit.  Macrocytosis. Polycythemia Likely related to vitamin B12 deficiency.  Which is chronic. Continue supplementation. Polycythemia mostly in the setting of active smoking. Outpatient follow-up   Subjective: No nausea or vomiting.  Oxygenation worsened to 7 L.  No chest pain.  No abdominal pain.  Cough improving.  Physical Exam: Bilateral basilar crackles. No wheezing. S1-S2 present. Bowel sound present but No edema.  Data Reviewed: I have Reviewed nursing notes, Vitals, and Lab results. Since last encounter, pertinent lab results CBC and BMP   . I have ordered test including CBC and BMP  . I have discussed pt's care plan and test results with pulmonary  . I have ordered imaging CT PE protocol  .   Disposition: Status is: Inpatient Remains inpatient appropriate because: Monitor for improvement in pneumothorax and oxygenation  enoxaparin  (LOVENOX ) injection 40 mg Start: 09/01/24 1000 SCDs Start: 08/29/24 0019   Family Communication: Family at bedside Level of care: Telemetry continue Vitals:   09/03/24 0918 09/03/24 1117 09/03/24 1327 09/03/24 1359  BP:    111/64  Pulse:    89  Resp:    19  Temp:    (!) 97.4 F (36.3 C)  TempSrc:    Oral  SpO2: 94% 94% 92% 95%  Weight:      Height:         Author: Yetta Blanch, MD 09/03/2024 7:05 PM  Please look on [email or web address] to find out who is on call.

## 2024-09-03 NOTE — Progress Notes (Signed)
 Pt sats 94% on 7 LPM HF.

## 2024-09-03 NOTE — Consult Note (Signed)
 NAME:  Stacey [name], MRN:  [id number], DOB:  1956/02/12, LOS: 5 ADMISSION DATE:  08/28/2024, CONSULTATION DATE:  09/03/2024 REFERRING MD:  Dr. Tobie, CHIEF COMPLAINT:  pneumothorax   History of Present Illness:  Stacey [name] is a 68 y/o F with a PMH significant for chronic diastolic heart failure, moderate OSA, emphysema (no prior PFTs), nicotine  dependence with current use who presents for acute on chronic hypercapnic & hypoxic respiratory failure in the setting of acute on chronic diastolic heart failure and COPD exacerbation now found to have small left pneumothorax. Pulmonology consulted for evaluation and management of the latter.  The patient is a 68 year old with COPD who presents with worsening respiratory symptoms andpneumothorax. She is accompanied by her husband, who has been with her throughout the hospital stay.  She initially experienced symptoms resembling a severe chest cold, including a sore throat, chest congestion, and difficulty breathing. Despite using cough drops, her symptoms did not improve, and her condition worsened, leading to an ER visit due to severe symptoms such as difficulty walking and cyanosis of the hands. She was subsequently transferred to the ICU step-down unit.  She has a history of COPD and has recently experienced symptoms including wheezing, congestion, and coughing. Prior to this episode, she was not on any breathing treatments at home. In the hospital, she has been receiving nebulizer treatments and steroids and, according to her spouse, was on a BiPAP machine for the first day and a half of her ICU stay.  She has a history of moderate sleep apnea. She reports that her sleep study was unsuccessful because she only slept one hour, which she attributes to nervousness and construction noise outside the window. She has not been using a CPAP machine recently.  She has a history of smoking for approximately 50 years, having started as a teenager. She has not  smoked for six days since her hospital admission and is currently using a nicotine  patch.  In April, she experienced significant swelling in her feet and ankles, which led to a series of tests. She is currently taking Lasix  and spironolactone  for this condition.  She was informed that she has a small pneumothorax on the left side. She has been using an incentive spirometer to help expand her lungs.  She has been around elderly family members recently but reports no one else being sick. She has no children or grandchildren and enjoys social activities such as watching football and playing pool.  Pertinent  Medical History   Past Medical History:  Diagnosis Date   Aortic atherosclerosis    Breast cancer (HCC) 09/22/2006   L side, s/p lumpectomy, chemotherapy and radiation   Centrilobular emphysema (HCC)    Chronic combined systolic and diastolic heart failure (HCC)    Edema    Leg swelling    OSA (obstructive sleep apnea)    Panic disorder    SOB (shortness of breath)    Tobacco abuse    Significant Hospital Events: Including procedures, antibiotic start and stop dates in addition to other pertinent events   12/13 --> worsening hypoxia, CTA Chest obtained, no PE, L small pneumothorax  Interim History / Subjective:  As above  Objective    Blood pressure 111/64, pulse 89, temperature (!) 97.4 F (36.3 C), temperature source Oral, resp. rate 19, height 5' 8 (1.727 m), weight 93.1 kg, SpO2 95%.        Intake/Output Summary (Last 24 hours) at 09/03/2024 1635 Last data filed at 09/03/2024 0818 Gross per 24  hour  Intake 360 ml  Output 350 ml  Net 10 ml   Filed Weights   08/31/24 0500 09/02/24 0405 09/03/24 0550  Weight: 97.3 kg 94.8 kg 93.1 kg    Examination: General: well appearing, no distress HENT: anicteric sclera, well injected conjunctivae, PERRL Lungs: diminished breath sounds in apices, clear otherwise, no wheezing, + clubbing Cardiovascular: distant heart  sounds Abdomen: soft, non-tender Extremities: no edema Neuro: A&O x 3 GU: deferred  Resolved problem list   Assessment and Plan   Acute Hypoxic Respiratory Failure due to COPD exacerbation Atelectasis Pneumothorax Acute exacerbation likely due to viral infection. Smoking cessation critical to prevent future exacerbations. - Continue albuterol  and ipratropium nebulizer treatments TID - Initiated sodium chloride  nebs TID - Initiated budesonide  nebulizer twice daily. - Initiated Brovana  nebulizer twice daily. - Initiated Yupelri  once daily - Continue prescribed steroids. - Chest PT with flutter valve - Encouraged smoking cessation and nicotine  patch use. - Educated on avoiding triggers like open flames and crowded places. - Would qualify for pulmonary rehab as an OP - Needs follow up in pulmonology office once discharge  Left-sided secondary spontaneous pneumothorax Small pneumothorax secondary to COPD exacerbation, not visible on x-ray, not requiring chest tube. - Administered high-flow oxygen therapy for nitrogen washout and lung re-expansion. - Instructed on incentive spirometer use ten times hourly while awake. - Monitor with periodic chest x-rays for pneumothorax stability. Ordered for 7 AM on 12/14. - Avoid NIPPV to prevent pneumothorax expansion  Moderate OSA: - Hold off NIPPV to avoid pneumothorax expansion  Acute on chronic diastolic heart failure > Appears relatively euvolemic on examination. - Agree with maintenance diuretics.  Patient Lines/Drains/Airways Status     Active Line/Drains/Airways     Name Placement date Placement time Site Days   Peripheral IV 08/28/24 20 G 1 Right Antecubital 08/28/24  1632  Antecubital  6   Peripheral IV 08/28/24 20 G Posterior;Right Hand 08/28/24  1653  Hand  6            Labs   CBC: Recent Labs  Lab 08/28/24 1635 08/28/24 1658 08/28/24 2026 08/29/24 0305 08/31/24 0942 09/02/24 0342  WBC 10.9*  --   --  6.6  11.1* 8.4  NEUTROABS 8.1*  --   --  6.1  --   --   HGB 17.1* 18.4* 17.7* 16.7* 16.3* 16.5*  HCT 52.6* 54.0* 52.0* 52.7* 51.7* 51.5*  MCV 101.0*  --   --  103.3* 104.0* 102.6*  PLT 162  --   --  163 183 185    Basic Metabolic Panel: Recent Labs  Lab 08/29/24 0305 08/30/24 0306 08/31/24 0942 09/01/24 0823 09/02/24 0342 09/03/24 1253  NA 140 140 142 142 140 140  K 4.4 5.0 4.2 4.2 4.1 3.9  CL 98 97* 98 95* 94* 94*  CO2 30 34* 40* 36* 37* 39*  GLUCOSE 164* 169* 96 135* 143* 130*  BUN 15 31* 34* 33* 29* 27*  CREATININE 0.72 0.92 0.62 0.71 0.69 0.99  CALCIUM 9.0 9.1 9.0 9.1 9.1 9.4  MG 3.2*  --  2.6* 2.2 2.4 2.4  PHOS 4.6  --   --   --   --   --    GFR: Estimated Creatinine Clearance: 64 mL/min (by C-G formula based on SCr of 0.99 mg/dL). Recent Labs  Lab 08/28/24 1635 08/28/24 1910 08/29/24 0305 08/31/24 0942 09/02/24 0342  PROCALCITON  --   --  <0.10  --   --   WBC 10.9*  --  6.6 11.1* 8.4  LATICACIDVEN 2.2* 1.1  --   --   --     Liver Function Tests: Recent Labs  Lab 08/28/24 1635 08/29/24 0305  AST 30 24  ALT 27 27  ALKPHOS 91 86  BILITOT 0.6 0.4  PROT 7.1 6.8  ALBUMIN 4.2 4.1   No results for input(s): LIPASE, AMYLASE in the last 168 hours. No results for input(s): AMMONIA in the last 168 hours.  ABG    Component Value Date/Time   HCO3 38.6 (H) 08/29/2024 0659   TCO2 39 (H) 08/28/2024 2026   O2SAT 79 08/29/2024 0659     Coagulation Profile: No results for input(s): INR, PROTIME in the last 168 hours.  Cardiac Enzymes: No results for input(s): CKTOTAL, CKMB, CKMBINDEX, TROPONINI in the last 168 hours.  HbA1C: Hgb A1c MFr Bld  Date/Time Value Ref Range Status  12/28/2023 03:35 PM 6.5 4.6 - 6.5 % Final    Comment:    Glycemic Control Guidelines for People with Diabetes:Non Diabetic:  <6%Goal of Therapy: <7%Additional Action Suggested:  >8%   03/10/2023 10:28 AM 6.4 4.6 - 6.5 % Final    Comment:    Glycemic Control Guidelines  for People with Diabetes:Non Diabetic:  <6%Goal of Therapy: <7%Additional Action Suggested:  >8%     CBG: No results for input(s): GLUCAP in the last 168 hours.  Review of Systems:   Not obtained  Past Medical History:  She,  has a past medical history of Aortic atherosclerosis, Breast cancer (HCC) (09/22/2006), Centrilobular emphysema (HCC), Chronic combined systolic and diastolic heart failure (HCC), Edema, Leg swelling, OSA (obstructive sleep apnea), Panic disorder, SOB (shortness of breath), and Tobacco abuse.   Surgical History:   Past Surgical History:  Procedure Laterality Date   BREAST LUMPECTOMY  2008     Social History:   reports that she has been smoking cigarettes. She has a 67.5 pack-year smoking history. She has never used smokeless tobacco. She reports current alcohol use. She reports that she does not use drugs.   Family History:  Her family history includes Breast cancer in her paternal aunt and sister; Celiac disease in her paternal grandmother; Colon cancer in her paternal aunt; Diabetes in her mother and another family member; Heart disease in her brother, father, maternal grandfather, and paternal grandfather.   Allergies Allergies[1]   Home Medications  Prior to Admission medications  Medication Sig Start Date End Date Taking? Authorizing Provider  aspirin EC 81 MG tablet Take 81 mg by mouth daily. Swallow whole.   Yes [provider]  Calcium-Magnesium -Vitamin D  600-40-500 MG-MG-UNIT TB24 Take 1 tablet by mouth daily.   Yes [provider]  Cholecalciferol (VITAMIN D -3) 1000 UNITS CAPS Take 1 capsule by mouth daily.   Yes [provider]  diazepam  (VALIUM ) 5 MG tablet Take 1 tablet (5 mg total) by mouth daily as needed for anxiety. 12/31/20  Yes Rollene Almarie LABOR, MD  furosemide  (LASIX ) 40 MG tablet Take 1 tablet (40 mg total) by mouth daily. 01/20/24  Yes Court Dorn PARAS, MD  Multiple Vitamins-Minerals (CENTRUM SILVER)  tablet Take 1 tablet by mouth daily.   Yes [provider]  spironolactone  (ALDACTONE ) 25 MG tablet Take 1 tablet (25 mg total) by mouth daily. 03/03/24  Yes Goodrich, Callie E, PA-C  vitamin B-12 (CYANOCOBALAMIN ) 1000 MCG tablet Take 1,000 mcg by mouth daily.   Yes [provider]  vitamin C (ASCORBIC ACID) 500 MG tablet Take 500 mg by mouth daily.  Yes [provider]     Thank you for this interesting consult. I have spent 55 minutes evaluating patient, reviewing chart, and discussing plan of care with patient, family, and primary medical team. If you have any questions or concerns please reach out to me via secure chat.  Paula Southerly, MD [facility]           [1]  Allergies Allergen Reactions   Amoxicillin     REACTION: rash   Penicillins     REACTION: rash

## 2024-09-03 NOTE — Progress Notes (Signed)
 RT notified RN of low O2. Pt placed on 3 LPM [place]. RN and family at bedside.

## 2024-09-04 ENCOUNTER — Inpatient Hospital Stay (HOSPITAL_COMMUNITY)

## 2024-09-04 LAB — CBC
HCT: 50.3 % — ABNORMAL HIGH (ref 36.0–46.0)
Hemoglobin: 16.1 g/dL — ABNORMAL HIGH (ref 12.0–15.0)
MCH: 32.5 pg (ref 26.0–34.0)
MCHC: 32 g/dL (ref 30.0–36.0)
MCV: 101.6 fL — ABNORMAL HIGH (ref 80.0–100.0)
Platelets: 172 K/uL (ref 150–400)
RBC: 4.95 MIL/uL (ref 3.87–5.11)
RDW: 13.2 % (ref 11.5–15.5)
WBC: 13.3 K/uL — ABNORMAL HIGH (ref 4.0–10.5)
nRBC: 0 % (ref 0.0–0.2)

## 2024-09-04 LAB — BASIC METABOLIC PANEL WITH GFR
Anion gap: 12 (ref 5–15)
BUN: 25 mg/dL — ABNORMAL HIGH (ref 8–23)
CO2: 31 mmol/L (ref 22–32)
Calcium: 9.2 mg/dL (ref 8.9–10.3)
Chloride: 95 mmol/L — ABNORMAL LOW (ref 98–111)
Creatinine, Ser: 0.73 mg/dL (ref 0.44–1.00)
GFR, Estimated: >60 mL/min (ref >60)
Glucose, Bld: 137 mg/dL — ABNORMAL HIGH (ref 70–99)
Potassium: 4.4 mmol/L (ref 3.5–5.1)
Sodium: 137 mmol/L (ref 135–145)

## 2024-09-04 LAB — MAGNESIUM: Magnesium: 2.7 mg/dL — ABNORMAL HIGH (ref 1.7–2.4)

## 2024-09-04 MED ADMIN — Lorazepam Tab 0.5 MG: 0.5 mg | ORAL | NDC 69315090401

## 2024-09-04 NOTE — Progress Notes (Signed)
 [facility] PROGRESS NOTE    Progress Note  Stacey [name]  FMW:[id number] DOB: 1955/10/03 DOA: 08/28/2024 PCP: Rollene Almarie LABOR, MD     Brief Narrative:   Stacey [name] is an 68 y.o. female past medical history HFpEF, active smoker obesity breast cancer comes in complaining of cough and shortness of breath found to be in acute respiratory failure with hypoxia due to COPD exacerbation.  Assessment/Plan:   Acute hypoxic on chronic hypercapnic respiratory failure secondary to acute bronchitis and small pneumothorax: She was started on IV steroids nebulizer and antibiotics. Still requiring 9 L of high flow nasal cannula to keep saturations greater 95%. Oxygen saturations were improving but worsened on 09/03/2024. CT angio on 09/03/2024 showed no evidence of PE, small pneumothorax 5 to 10% and small bronchial wall thickening Pulmonary was consulted recommended conservative management.  They also recommended to avoid BiPAP. Continue budesonide  Brovana  Yupelri .  They also recommended to initiate sodium chloride  nebs 3 times daily continue prescribed steroids. Repeated chest x-ray showed persistent left apical pneumothorax and persistent left base collapsed lung. Appreciate pulmonary's assistance. Smoking cessation.  Moderate obstructive sleep apnea/possibly obesity hypoventilation syndrome: Patient is sleep study as an outpatient and she was hypoxic. ABG showed pCO2 of 70, she could not tolerate BiPAP due to small pneumothorax. Continue nocturnal oxygen supplementation.  Acute on chronic HFpEF: 2D echo showed diastolic dysfunction with preserved EF. She was started on IV Lasix  now has been switched to oral Lasix . Aldactone  was held due to hyperkalemia.  Active tobacco abuse Noted.  Macrocytosis/polycythemia: Likely related to B12 deficiency currently on supplementation. Will need to check saturations with ambulation she has an elevated hemoglobin which may qualify her  for oxygen due to hypoxia.   DVT prophylaxis: lovenox  Family Communication:none Status is: Inpatient Remains inpatient appropriate because: Acute respiratory failure with hypoxia secondary to acute bronchitis and small pneumothorax    Code Status:     Code Status Orders  (From admission, onward)           Start     Ordered   08/29/24 0020  Full code  Continuous       Question:  By:  Answer:  Consent: discussion documented in EHR   08/29/24 0020           Code Status History     This patient has a current code status but no historical code status.         IV Access:   Peripheral IV   Procedures and diagnostic studies:   DG CHEST PORT 1 VIEW Result Date: 09/04/2024 CLINICAL DATA:  Pneumothorax ex. EXAM: PORTABLE CHEST 1 VIEW COMPARISON:  08/31/2024 FINDINGS: Findings suspicious for persistent left apical pneumothorax since CT yesterday despite lung marking seen cranial to the apparent pleural line. Left base collapse/consolidation again noted. The cardio pericardial silhouette is enlarged. No acute bony abnormality. Telemetry leads overlie the chest. IMPRESSION: 1. Findings suspicious for persistent left apical pneumothorax despite lung marking seen cranial to the apparent pleural line. 2. Persistent left base collapse/consolidation. Electronically Signed   By: Camellia Candle M.D.   On: 09/04/2024 07:57   CT Angio Chest Pulmonary Embolism (PE) W or WO Contrast Addendum Date: 09/03/2024 ADDENDUM REPORT: 09/03/2024 15:57 ADDENDUM: Critical Value/emergent results were called by telephone at the time of interpretation on 09/03/2024 at 3:51 pm to provider [facility] PATEL , who verbally acknowledged these results. Electronically Signed   By: Leita Birmingham M.D.   On: 09/03/2024 15:57   Result Date: 09/03/2024 CLINICAL  DATA:  Pulmonary embolism suspected, high probability. EXAM: CT ANGIOGRAPHY CHEST WITH CONTRAST TECHNIQUE: Multidetector CT imaging of the chest was performed  using the standard protocol during bolus administration of intravenous contrast. Multiplanar CT image reconstructions and MIPs were obtained to evaluate the vascular anatomy. RADIATION DOSE REDUCTION: This exam was performed according to the departmental dose-optimization program which includes automated exposure control, adjustment of the mA and/or kV according to patient size and/or use of iterative reconstruction technique. CONTRAST:  75mL OMNIPAQUE  IOHEXOL  350 MG/ML SOLN COMPARISON:  02/04/2024. FINDINGS: Cardiovascular: The heart is enlarged and there is no pericardial effusion. Scattered coronary artery calcifications are noted. There is atherosclerotic calcification of the aorta without evidence of aneurysm. The pulmonary trunk is normal in caliber. No evidence of pulmonary embolism is seen. Mediastinum/Nodes: No enlarged mediastinal, hilar, or axillary lymph nodes. Thyroid  gland, trachea, and esophagus demonstrate no significant findings. A small amount air is noted in the epicardial fat. Lungs/Pleura: Small pneumothorax is present on the left of approximately 5-10%. Atelectasis is noted bilaterally. Centrilobular emphysema is noted. Peribronchial cuffing is present bilaterally, which may be infectious or inflammatory. Upper Abdomen: A few scattered hypodensities are present in the liver which are too small to further characterize. There is a cyst in the left kidney. No acute abnormality is seen. Musculoskeletal: Degenerative changes are present in the thoracic spine. No acute osseous abnormality is seen. Review of the MIP images confirms the above findings. IMPRESSION: 1. No evidence of pulmonary embolism. 2. Small left pneumothorax of approximally 5-10%. 3. Bronchial wall thickening bilaterally, which may be infectious or inflammatory. 4. Atelectasis at the lung bases bilaterally. 5. Coronary artery calcifications and aortic atherosclerosis. Electronically Signed: By: Leita Birmingham M.D. On: 09/03/2024  15:45     Medical Consultants:   None.   Subjective:    Stacey [name] she feels about the same she  Objective:    Vitals:   09/03/24 1359 09/03/24 2142 09/04/24 0513 09/04/24 0847  BP: 111/64 115/66 122/67   Pulse: 89 99 88   Resp: 19 16 20    Temp: (!) 97.4 F (36.3 C) 98.1 F (36.7 C) (!) 97.4 F (36.3 C)   TempSrc: Oral Oral Oral   SpO2: 95% 94% 99% 99%  Weight:   94.1 kg   Height:       SpO2: 99 % O2 Flow Rate (L/min): (S) 9 L/min (Weaned to 7 L HFNC, safety valve checked, H20 bottle refilled.) FiO2 (%): 50 %   Intake/Output Summary (Last 24 hours) at 09/04/2024 1022 Last data filed at 09/03/2024 2200 Gross per 24 hour  Intake 120 ml  Output --  Net 120 ml   Filed Weights   09/02/24 0405 09/03/24 0550 09/04/24 0513  Weight: 94.8 kg 93.1 kg 94.1 kg    Exam: General exam: In no acute distress. Respiratory system: Good air movement and clear to auscultation. Cardiovascular system: S1 & S2 heard, RRR. No JVD. Gastrointestinal system: Abdomen is nondistended, soft and nontender.  Extremities: No pedal edema. Skin: No rashes, lesions or ulcers Psychiatry: Judgement and insight appear normal. Mood & affect appropriate.    Data Reviewed:    Labs: Basic Metabolic Panel: Recent Labs  Lab 08/29/24 0305 08/30/24 0306 08/31/24 0942 09/01/24 0823 09/02/24 0342 09/03/24 1253 09/04/24 0414  NA 140   < > 142 142 140 140 137  K 4.4   < > 4.2 4.2 4.1 3.9 4.4  CL 98   < > 98 95* 94* 94* 95*  CO2 30   < >  40* 36* 37* 39* 31  GLUCOSE 164*   < > 96 135* 143* 130* 137*  BUN 15   < > 34* 33* 29* 27* 25*  CREATININE 0.72   < > 0.62 0.71 0.69 0.99 0.73  CALCIUM 9.0   < > 9.0 9.1 9.1 9.4 9.2  MG 3.2*  --  2.6* 2.2 2.4 2.4 2.7*  PHOS 4.6  --   --   --   --   --   --    < > = values in this interval not displayed.   GFR Estimated Creatinine Clearance: 79.6 mL/min (by C-G formula based on SCr of 0.73 mg/dL). Liver Function Tests: Recent Labs  Lab  08/28/24 1635 08/29/24 0305  AST 30 24  ALT 27 27  ALKPHOS 91 86  BILITOT 0.6 0.4  PROT 7.1 6.8  ALBUMIN 4.2 4.1   No results for input(s): LIPASE, AMYLASE in the last 168 hours. No results for input(s): AMMONIA in the last 168 hours. Coagulation profile No results for input(s): INR, PROTIME in the last 168 hours. COVID-19 Labs  No results for input(s): DDIMER, FERRITIN, LDH, CRP in the last 72 hours.  Lab Results  Component Value Date   SARSCOV2NAA NEGATIVE 08/28/2024    CBC: Recent Labs  Lab 08/28/24 1635 08/28/24 1658 08/28/24 2026 08/29/24 0305 08/31/24 0942 09/02/24 0342 09/04/24 0414  WBC 10.9*  --   --  6.6 11.1* 8.4 13.3*  NEUTROABS 8.1*  --   --  6.1  --   --   --   HGB 17.1*   < > 17.7* 16.7* 16.3* 16.5* 16.1*  HCT 52.6*   < > 52.0* 52.7* 51.7* 51.5* 50.3*  MCV 101.0*  --   --  103.3* 104.0* 102.6* 101.6*  PLT 162  --   --  163 183 185 172   < > = values in this interval not displayed.   Cardiac Enzymes: No results for input(s): CKTOTAL, CKMB, CKMBINDEX, TROPONINI in the last 168 hours. BNP (last 3 results) Recent Labs    08/28/24 1635 08/29/24 0305 09/03/24 1253  PROBNP 187.0 237.0 <50.0   CBG: No results for input(s): GLUCAP in the last 168 hours. D-Dimer: No results for input(s): DDIMER in the last 72 hours. Hgb A1c: No results for input(s): HGBA1C in the last 72 hours. Lipid Profile: No results for input(s): CHOL, HDL, LDLCALC, TRIG, CHOLHDL, LDLDIRECT in the last 72 hours. Thyroid  function studies: No results for input(s): TSH, T4TOTAL, T3FREE, THYROIDAB in the last 72 hours.  Invalid input(s): FREET3 Anemia work up: No results for input(s): VITAMINB12, FOLATE, FERRITIN, TIBC, IRON, RETICCTPCT in the last 72 hours. Sepsis Labs: Recent Labs  Lab 08/28/24 1635 08/28/24 1910 08/29/24 0305 08/31/24 0942 09/02/24 0342 09/04/24 0414  PROCALCITON  --   --  <0.10  --   --    --   WBC 10.9*  --  6.6 11.1* 8.4 13.3*  LATICACIDVEN 2.2* 1.1  --   --   --   --    Microbiology Recent Results (from the past 240 hours)  Culture, blood (routine x 2)     Status: None   Collection Time: 08/28/24  4:35 PM   Specimen: BLOOD  Result Value Ref Range Status   Specimen Description   Final    BLOOD RIGHT ANTECUBITAL Performed at [facility], [address] [place], [place] [address]    Special Requests   Final    BOTTLES DRAWN AEROBIC  AND ANAEROBIC Blood Culture results may not be optimal due to an inadequate volume of blood received in culture bottles Performed at [facility], [address] [place], [place] [address]    Culture   Final    NO GROWTH 5 DAYS Performed at [facility], [address] [place], [place] [address]    Report Status 09/02/2024 FINAL  Final  Resp panel by RT-PCR (RSV, Flu A&B, Covid) Anterior Nasal Swab     Status: None   Collection Time: 08/28/24  4:35 PM   Specimen: Anterior Nasal Swab  Result Value Ref Range Status   SARS Coronavirus 2 by RT PCR NEGATIVE NEGATIVE Final    Comment: (NOTE) SARS-CoV-2 target nucleic acids are NOT DETECTED.  The SARS-CoV-2 RNA is generally detectable in upper respiratory specimens during the acute phase of infection. The lowest concentration of SARS-CoV-2 viral copies this assay can detect is 138 copies/mL. A negative result does not preclude SARS-Cov-2 infection and should not be used as the sole basis for treatment or other patient management decisions. A negative result may occur with  improper specimen collection/handling, submission of specimen other than nasopharyngeal swab, presence of viral mutation(s) within the areas targeted by this assay, and inadequate number of viral copies(<138 copies/mL). A negative result must be combined with clinical observations, patient history, and epidemiological information. The expected result is Negative.  Fact Sheet for  Patients:  bloggercourse.com  Fact Sheet for Healthcare Providers:  seriousbroker.it  This test is no t yet approved or cleared by the [place]  FDA and  has been authorized for detection and/or diagnosis of SARS-CoV-2 by FDA under an Emergency Use Authorization (EUA). This EUA will remain  in effect (meaning this test can be used) for the duration of the COVID-19 declaration under Section 564(b)(1) of the Act, 21 U.S.C.section 360bbb-3(b)(1), unless the authorization is terminated  or revoked sooner.       Influenza A by PCR NEGATIVE NEGATIVE Final   Influenza B by PCR NEGATIVE NEGATIVE Final    Comment: (NOTE) The Xpert Xpress SARS-CoV-2/FLU/RSV plus assay is intended as an aid in the diagnosis of influenza from Nasopharyngeal swab specimens and should not be used as a sole basis for treatment. Nasal washings and aspirates are unacceptable for Xpert Xpress SARS-CoV-2/FLU/RSV testing.  Fact Sheet for Patients: bloggercourse.com  Fact Sheet for Healthcare Providers: seriousbroker.it  This test is not yet approved or cleared by the [place]  FDA and has been authorized for detection and/or diagnosis of SARS-CoV-2 by FDA under an Emergency Use Authorization (EUA). This EUA will remain in effect (meaning this test can be used) for the duration of the COVID-19 declaration under Section 564(b)(1) of the Act, 21 U.S.C. section 360bbb-3(b)(1), unless the authorization is terminated or revoked.     Resp Syncytial Virus by PCR NEGATIVE NEGATIVE Final    Comment: (NOTE) Fact Sheet for Patients: bloggercourse.com  Fact Sheet for Healthcare Providers: seriousbroker.it  This test is not yet approved or cleared by the [place]  FDA and has been authorized for detection and/or diagnosis of SARS-CoV-2 by FDA under an Emergency  Use Authorization (EUA). This EUA will remain in effect (meaning this test can be used) for the duration of the COVID-19 declaration under Section 564(b)(1) of the Act, 21 U.S.C. section 360bbb-3(b)(1), unless the authorization is terminated or revoked.  Performed at [facility], [address] [place], [place] [address]   Culture, blood (routine x 2)  Status: None   Collection Time: 08/28/24  4:40 PM   Specimen: BLOOD RIGHT HAND  Result Value Ref Range Status   Specimen Description   Final    BLOOD RIGHT HAND Performed at [facility], [address] [place], [place] [address]    Special Requests   Final    BOTTLES DRAWN AEROBIC AND ANAEROBIC Blood Culture results may not be optimal due to an inadequate volume of blood received in culture bottles Performed at [facility], [address] [place], [place] [address]    Culture   Final    NO GROWTH 5 DAYS Performed at [facility], [address], [place], [place] [address]    Report Status 09/02/2024 FINAL  Final  MRSA Next Gen by PCR, Nasal     Status: None   Collection Time: 08/29/24 12:26 AM   Specimen: Nasal Mucosa; Nasal Swab  Result Value Ref Range Status   MRSA by PCR Next Gen NOT DETECTED NOT DETECTED Final    Comment: (NOTE) The GeneXpert MRSA Assay (FDA approved for NASAL specimens only), is one component of a comprehensive MRSA colonization surveillance program. It is not intended to diagnose MRSA infection nor to guide or monitor treatment for MRSA infections. Test performance is not FDA approved in patients less than 72 years old. Performed at [facility], [address], [place], [place] [address]      Medications:    arformoterol   15 mcg Nebulization BID   azithromycin   250 mg Oral q1800   benzonatate   200 mg Oral TID   budesonide  (PULMICORT ) nebulizer solution  0.25 mg Nebulization BID   vitamin B-12  1,000 mcg Oral Daily    dextromethorphan   30 mg Oral BID   enoxaparin  (LOVENOX ) injection  40 mg Subcutaneous Q24H   furosemide   40 mg Oral Daily   guaiFENesin   600 mg Oral BID   magic mouthwash  5 mL Oral QID   methylPREDNISolone  (SOLU-MEDROL ) injection  40 mg Intravenous BID   mouth rinse  15 mL Mouth Rinse 4 times per day   revefenacin   175 mcg Nebulization Daily   sodium chloride  HYPERTONIC  4 mL Nebulization TID   Continuous Infusions:    LOS: 6 days   Erle Odell Castor  [facility]  09/04/2024, 10:22 AM

## 2024-09-04 NOTE — Progress Notes (Signed)
 NAME:  Stacey [name], MRN:  [id number], DOB:  1956-07-16, LOS: 6 ADMISSION DATE:  08/28/2024, CONSULTATION DATE:  09/04/2024 REFERRING MD:  Dr. Tobie, CHIEF COMPLAINT:  pneumothorax   History of Present Illness:  Stacey [name] is a 68 y/o F with a PMH significant for chronic diastolic heart failure, moderate OSA, emphysema (no prior PFTs), nicotine  dependence with current use who presents for acute on chronic hypercapnic & hypoxic respiratory failure in the setting of acute on chronic diastolic heart failure and COPD exacerbation now found to have small left pneumothorax. Pulmonology consulted for evaluation and management of the latter.  The patient is a 68 year old with COPD who presents with worsening respiratory symptoms andpneumothorax. She is accompanied by her husband, who has been with her throughout the hospital stay.  She initially experienced symptoms resembling a severe chest cold, including a sore throat, chest congestion, and difficulty breathing. Despite using cough drops, her symptoms did not improve, and her condition worsened, leading to an ER visit due to severe symptoms such as difficulty walking and cyanosis of the hands. She was subsequently transferred to the ICU step-down unit.  She has a history of COPD and has recently experienced symptoms including wheezing, congestion, and coughing. Prior to this episode, she was not on any breathing treatments at home. In the hospital, she has been receiving nebulizer treatments and steroids and, according to her spouse, was on a BiPAP machine for the first day and a half of her ICU stay.  She has a history of moderate sleep apnea. She reports that her sleep study was unsuccessful because she only slept one hour, which she attributes to nervousness and construction noise outside the window. She has not been using a CPAP machine recently.  She has a history of smoking for approximately 50 years, having started as a teenager. She has not  smoked for six days since her hospital admission and is currently using a nicotine  patch.  In April, she experienced significant swelling in her feet and ankles, which led to a series of tests. She is currently taking Lasix  and spironolactone  for this condition.  She was informed that she has a small pneumothorax on the left side. She has been using an incentive spirometer to help expand her lungs.  She has been around elderly family members recently but reports no one else being sick. She has no children or grandchildren and enjoys social activities such as watching football and playing pool.  Pertinent  Medical History   Past Medical History:  Diagnosis Date   Aortic atherosclerosis    Breast cancer (HCC) 09/22/2006   L side, s/p lumpectomy, chemotherapy and radiation   Centrilobular emphysema (HCC)    Chronic combined systolic and diastolic heart failure (HCC)    Edema    Leg swelling    OSA (obstructive sleep apnea)    Panic disorder    SOB (shortness of breath)    Tobacco abuse    Significant Hospital Events: Including procedures, antibiotic start and stop dates in addition to other pertinent events   12/13 --> worsening hypoxia, CTA Chest obtained, no PE, L small pneumothorax 12/14 --> AM CXR, no clear evidence of pneumothorax, weaned down to 5 L O2 by [place]  Interim History / Subjective:  Today, patient is a little anxious about not making headway in oxygen requirements. I reassured her that she is actually making a lot of headway. The patient notes dyspnea has slightly improved compared to yesterday.  Objective    Blood  pressure 122/67, pulse 88, temperature (!) 97.4 F (36.3 C), temperature source Oral, resp. rate 20, height 5' 8 (1.727 m), weight 94.1 kg, SpO2 97%.        Intake/Output Summary (Last 24 hours) at 09/04/2024 1327 Last data filed at 09/03/2024 2200 Gross per 24 hour  Intake 120 ml  Output --  Net 120 ml   Filed Weights   09/02/24 0405 09/03/24 0550  09/04/24 0513  Weight: 94.8 kg 93.1 kg 94.1 kg    Examination: General: NAD, alert Eyes: PERRL, no scleral icterus ENMT: oropharynx clear, good dentition, no oral lesions, mallampati score IV Skin: warm, intact, no rashes Neck: JVD 9 cm H2O CV: distant heart sounds, no peripheral edema Resp: diminished in apices, moving air in all lobes, clear, no rhonchi or wheezing. Neuro: Awake alert oriented to person place time and situation  Resolved problem list   Assessment and Plan   Acute Hypoxic Respiratory Failure due to COPD exacerbation Atelectasis Pneumothorax Acute exacerbation likely due to viral infection. Smoking cessation critical to prevent future exacerbations. - Continue ICS/LAMA/LABA - Continue bronchodilators, mucolytics, and chest physiotherapy - Continue steroids - Encouraged smoking cessation and nicotine  patch use. - Will need follow up in pulmonary office two weeks post discharge  Left-sided secondary spontaneous pneumothorax Small pneumothorax secondary to COPD exacerbation, not visible on x-ray, not requiring chest tube. CXR completed at noon without visible pneumothorax on my review. - Continue oxygen supplementation to meet goal > 92% - Instructed on incentive spirometer use ten times hourly while awake. - Likely will need a repeat dry CT chest in a few days to ensure resolution since pneumothorax not visible on CXR. - Avoid NIPPV to prevent pneumothorax expansion  Moderate OSA: - Hold off NIPPV to avoid pneumothorax expansion  Acute on chronic diastolic heart failure > Appears relatively euvolemic on examination. - Agree with maintenance diuretics.  Patient Lines/Drains/Airways Status     Active Line/Drains/Airways     Name Placement date Placement time Site Days   Peripheral IV 08/28/24 20 G 1 Right Antecubital 08/28/24  1632  Antecubital  7   Peripheral IV 08/28/24 20 G Posterior;Right Hand 08/28/24  1653  Hand  7            Labs    CBC: Recent Labs  Lab 08/28/24 1635 08/28/24 1658 08/28/24 2026 08/29/24 0305 08/31/24 0942 09/02/24 0342 09/04/24 0414  WBC 10.9*  --   --  6.6 11.1* 8.4 13.3*  NEUTROABS 8.1*  --   --  6.1  --   --   --   HGB 17.1*   < > 17.7* 16.7* 16.3* 16.5* 16.1*  HCT 52.6*   < > 52.0* 52.7* 51.7* 51.5* 50.3*  MCV 101.0*  --   --  103.3* 104.0* 102.6* 101.6*  PLT 162  --   --  163 183 185 172   < > = values in this interval not displayed.    Basic Metabolic Panel: Recent Labs  Lab 08/29/24 0305 08/30/24 0306 08/31/24 0942 09/01/24 0823 09/02/24 0342 09/03/24 1253 09/04/24 0414  NA 140   < > 142 142 140 140 137  K 4.4   < > 4.2 4.2 4.1 3.9 4.4  CL 98   < > 98 95* 94* 94* 95*  CO2 30   < > 40* 36* 37* 39* 31  GLUCOSE 164*   < > 96 135* 143* 130* 137*  BUN 15   < > 34* 33* 29* 27* 25*  CREATININE 0.72   < > 0.62 0.71 0.69 0.99 0.73  CALCIUM 9.0   < > 9.0 9.1 9.1 9.4 9.2  MG 3.2*  --  2.6* 2.2 2.4 2.4 2.7*  PHOS 4.6  --   --   --   --   --   --    < > = values in this interval not displayed.   GFR: Estimated Creatinine Clearance: 79.6 mL/min (by C-G formula based on SCr of 0.73 mg/dL). Recent Labs  Lab 08/28/24 1635 08/28/24 1910 08/29/24 0305 08/31/24 0942 09/02/24 0342 09/04/24 0414  PROCALCITON  --   --  <0.10  --   --   --   WBC 10.9*  --  6.6 11.1* 8.4 13.3*  LATICACIDVEN 2.2* 1.1  --   --   --   --     Liver Function Tests: Recent Labs  Lab 08/28/24 1635 08/29/24 0305  AST 30 24  ALT 27 27  ALKPHOS 91 86  BILITOT 0.6 0.4  PROT 7.1 6.8  ALBUMIN 4.2 4.1   No results for input(s): LIPASE, AMYLASE in the last 168 hours. No results for input(s): AMMONIA in the last 168 hours.  ABG    Component Value Date/Time   HCO3 38.6 (H) 08/29/2024 0659   TCO2 39 (H) 08/28/2024 2026   O2SAT 79 08/29/2024 0659     Coagulation Profile: No results for input(s): INR, PROTIME in the last 168 hours.  Cardiac Enzymes: No results for input(s): CKTOTAL,  CKMB, CKMBINDEX, TROPONINI in the last 168 hours.  HbA1C: Hgb A1c MFr Bld  Date/Time Value Ref Range Status  12/28/2023 03:35 PM 6.5 4.6 - 6.5 % Final    Comment:    Glycemic Control Guidelines for People with Diabetes:Non Diabetic:  <6%Goal of Therapy: <7%Additional Action Suggested:  >8%   03/10/2023 10:28 AM 6.4 4.6 - 6.5 % Final    Comment:    Glycemic Control Guidelines for People with Diabetes:Non Diabetic:  <6%Goal of Therapy: <7%Additional Action Suggested:  >8%     CBG: No results for input(s): GLUCAP in the last 168 hours.  Review of Systems:   Not obtained  Past Medical History:  She,  has a past medical history of Aortic atherosclerosis, Breast cancer (HCC) (09/22/2006), Centrilobular emphysema (HCC), Chronic combined systolic and diastolic heart failure (HCC), Edema, Leg swelling, OSA (obstructive sleep apnea), Panic disorder, SOB (shortness of breath), and Tobacco abuse.   Surgical History:   Past Surgical History:  Procedure Laterality Date   BREAST LUMPECTOMY  2008     Social History:   reports that she has been smoking cigarettes. She has a 67.5 pack-year smoking history. She has never used smokeless tobacco. She reports current alcohol use. She reports that she does not use drugs.   Family History:  Her family history includes Breast cancer in her paternal aunt and sister; Celiac disease in her paternal grandmother; Colon cancer in her paternal aunt; Diabetes in her mother and another family member; Heart disease in her brother, father, maternal grandfather, and paternal grandfather.   Allergies Allergies[1]   Home Medications  Prior to Admission medications  Medication Sig Start Date End Date Taking? Authorizing Provider  aspirin EC 81 MG tablet Take 81 mg by mouth daily. Swallow whole.   Yes [provider]  Calcium-Magnesium -Vitamin D  600-40-500 MG-MG-UNIT TB24 Take 1 tablet by mouth daily.   Yes [provider]   Cholecalciferol (VITAMIN D -3) 1000 UNITS CAPS Take 1 capsule by mouth daily.  Yes [provider]  diazepam  (VALIUM ) 5 MG tablet Take 1 tablet (5 mg total) by mouth daily as needed for anxiety. 12/31/20  Yes Rollene Almarie LABOR, MD  furosemide  (LASIX ) 40 MG tablet Take 1 tablet (40 mg total) by mouth daily. 01/20/24  Yes Court Dorn PARAS, MD  Multiple Vitamins-Minerals (CENTRUM SILVER) tablet Take 1 tablet by mouth daily.   Yes [provider]  spironolactone  (ALDACTONE ) 25 MG tablet Take 1 tablet (25 mg total) by mouth daily. 03/03/24  Yes Goodrich, Callie E, PA-C  vitamin B-12 (CYANOCOBALAMIN ) 1000 MCG tablet Take 1,000 mcg by mouth daily.   Yes [provider]  vitamin C (ASCORBIC ACID) 500 MG tablet Take 500 mg by mouth daily.   Yes [provider]     Thank you for this interesting consult. I have spent 35 minutes evaluating patient, reviewing chart, and discussing plan of care with patient, family, and primary medical team. If you have any questions or concerns please reach out to me via secure chat.  Paula Southerly, MD [facility]            [1]  Allergies Allergen Reactions   Amoxicillin     REACTION: rash   Penicillins     REACTION: rash

## 2024-09-04 NOTE — Progress Notes (Signed)
 RN weaned pt to 5 L [place], manufacturer recommends a liter flow of 6 or >, RT suggested a regular nasal cannula, pt refused to wear the [place] that is currently in stock due to a plastic piece on [place] that cuts her underneath nose, upper lip. RT will try to find a nasal cannula in house that doesn't have that feature to wean appropriately.  Pt also wanted an extension with the green [place], not recommended by manufacturer to apply extension to green [place]ter,  pt educated. MD stated she can use an 02 tank with ambulation, pt notified. Pt will remain on the [place] @ 5 L @ this time.

## 2024-09-05 ENCOUNTER — Inpatient Hospital Stay (HOSPITAL_COMMUNITY)

## 2024-09-05 DIAGNOSIS — R918 Other nonspecific abnormal finding of lung field: Secondary | ICD-10-CM | POA: Diagnosis not present

## 2024-09-05 DIAGNOSIS — J939 Pneumothorax, unspecified: Secondary | ICD-10-CM | POA: Diagnosis not present

## 2024-09-05 DIAGNOSIS — J90 Pleural effusion, not elsewhere classified: Secondary | ICD-10-CM | POA: Diagnosis not present

## 2024-09-05 MED ORDER — METHYLPREDNISOLONE SODIUM SUCC 40 MG IJ SOLR: 40.0000 mg | Freq: Every day | INTRAMUSCULAR | Status: DC

## 2024-09-05 MED ORDER — LORAZEPAM 0.5 MG PO TABS
0.2500 mg | ORAL_TABLET | Freq: Three times a day (TID) | ORAL | Status: DC | PRN
  Administered 2024-09-05: 21:00:00 0.25 mg via ORAL
  Filled 2024-09-05: qty 1

## 2024-09-05 NOTE — Progress Notes (Signed)
 [facility] PROGRESS NOTE    Progress Note  Carrigan Delafuente  FMW:[id number] DOB: 1956/04/30 DOA: 08/28/2024 PCP: Rollene Almarie LABOR, MD    Brief Narrative:   Stacey [name] is an 68 y.o. female past medical history HFpEF, active smoker obesity breast cancer comes in complaining of cough and shortness of breath found to be in acute respiratory failure with hypoxia due to COPD exacerbation. CT angio on 09/03/2024 showed no evidence of PE, small pneumothorax 5 to 10% and small bronchial wall thickening. Assessment/Plan:   Acute hypoxic on chronic hypercapnic respiratory failure secondary to acute bronchitis and small pneumothorax: She was started on IV steroids nebulizer and antibiotics. Requiring 4 L to keep saturations greater 95%. Pulmonary was consulted they also recommended to avoid BiPAP. Continue budesonide  Brovana  Yupelri .   They also recommended to initiate sodium chloride  nebs 3 times daily continue prescribed steroids. Appreciate pulmonary's assistance. Smoking cessation. Chest x-ray today showed what appears to be an increase in lung collapse versus moderate left-sided pleural effusion.  Moderate obstructive sleep apnea/possibly obesity hypoventilation syndrome: Patient is sleep study as an outpatient and she was hypoxic. ABG showed pCO2 of 70, she could not tolerate BiPAP due to small pneumothorax. Continue nocturnal oxygen supplementation.  Acute on chronic HFpEF: 2D echo showed diastolic dysfunction with preserved EF. She was started on IV Lasix  now has been switched to oral Lasix . Aldactone  was held due to hyperkalemia.  Active tobacco abuse Noted.  Macrocytosis/polycythemia: Likely related to B12 deficiency currently on supplementation. Will need to check saturations with ambulation she has an elevated hemoglobin which may qualify her for oxygen due to hypoxia.   DVT prophylaxis: lovenox  Family Communication:none Status is: Inpatient Remains inpatient  appropriate because: Acute respiratory failure with hypoxia secondary to acute bronchitis and small pneumothorax    Code Status:     Code Status Orders  (From admission, onward)           Start     Ordered   08/29/24 0020  Full code  Continuous       Question:  By:  Answer:  Consent: discussion documented in EHR   08/29/24 0020           Code Status History     This patient has a current code status but no historical code status.         IV Access:   Peripheral IV   Procedures and diagnostic studies:   DG CHEST PORT 1 VIEW Result Date: 09/05/2024 EXAM: 1 AP VIEW XRAY OF THE CHEST 09/05/2024 05:28:00 AM COMPARISON: 09/04/2024 CLINICAL HISTORY: Pneumothorax FINDINGS: LUNGS AND PLEURA: Increased moderate left pleural effusion. Trace left apical pneumothorax, similar. Mildly increased interstitial markings. Increased left basilar homogeneous opacities. HEART AND MEDIASTINUM: Surgical clips over left hilum. Atherosclerotic calcifications. No acute abnormality of the cardiac and mediastinal silhouettes. BONES AND SOFT TISSUES: No acute osseous abnormality. IMPRESSION: 1. Trace left apical pneumothorax, similar to prior study. 2. Increased moderate left pleural effusion. 3. Increased left basilar homogeneous opacities. Electronically signed by: Waddell Calk MD 09/05/2024 06:53 AM EST RP Workstation: [id number]   DG CHEST PORT 1 VIEW Result Date: 09/04/2024 CLINICAL DATA:  [id number] Pneumothorax [id number] EXAM: PORTABLE CHEST 1 VIEW COMPARISON:  September 04, 2024, September 03, 2024 FINDINGS: The cardiomediastinal silhouette is unchanged in contour.Sclerotic calcifications. Small LEFT pleural effusion. Favored trace LEFT apical pneumothorax. Bibasilar linear opacities, likely atelectasis. IMPRESSION: 1. Favored trace LEFT apical pneumothorax. 2. Small LEFT pleural effusion. Electronically Signed   By: Corean Salter M.D.  On: 09/04/2024 15:59   DG CHEST PORT 1 VIEW Result  Date: 09/04/2024 CLINICAL DATA:  Pneumothorax ex. EXAM: PORTABLE CHEST 1 VIEW COMPARISON:  08/31/2024 FINDINGS: Findings suspicious for persistent left apical pneumothorax since CT yesterday despite lung marking seen cranial to the apparent pleural line. Left base collapse/consolidation again noted. The cardio pericardial silhouette is enlarged. No acute bony abnormality. Telemetry leads overlie the chest. IMPRESSION: 1. Findings suspicious for persistent left apical pneumothorax despite lung marking seen cranial to the apparent pleural line. 2. Persistent left base collapse/consolidation. Electronically Signed   By: Camellia Candle M.D.   On: 09/04/2024 07:57   CT Angio Chest Pulmonary Embolism (PE) W or WO Contrast Addendum Date: 09/03/2024 ADDENDUM REPORT: 09/03/2024 15:57 ADDENDUM: Critical Value/emergent results were called by telephone at the time of interpretation on 09/03/2024 at 3:51 pm to provider [facility] PATEL , who verbally acknowledged these results. Electronically Signed   By: Leita Birmingham M.D.   On: 09/03/2024 15:57   Result Date: 09/03/2024 CLINICAL DATA:  Pulmonary embolism suspected, high probability. EXAM: CT ANGIOGRAPHY CHEST WITH CONTRAST TECHNIQUE: Multidetector CT imaging of the chest was performed using the standard protocol during bolus administration of intravenous contrast. Multiplanar CT image reconstructions and MIPs were obtained to evaluate the vascular anatomy. RADIATION DOSE REDUCTION: This exam was performed according to the departmental dose-optimization program which includes automated exposure control, adjustment of the mA and/or kV according to patient size and/or use of iterative reconstruction technique. CONTRAST:  75mL OMNIPAQUE  IOHEXOL  350 MG/ML SOLN COMPARISON:  02/04/2024. FINDINGS: Cardiovascular: The heart is enlarged and there is no pericardial effusion. Scattered coronary artery calcifications are noted. There is atherosclerotic calcification of the aorta without  evidence of aneurysm. The pulmonary trunk is normal in caliber. No evidence of pulmonary embolism is seen. Mediastinum/Nodes: No enlarged mediastinal, hilar, or axillary lymph nodes. Thyroid  gland, trachea, and esophagus demonstrate no significant findings. A small amount air is noted in the epicardial fat. Lungs/Pleura: Small pneumothorax is present on the left of approximately 5-10%. Atelectasis is noted bilaterally. Centrilobular emphysema is noted. Peribronchial cuffing is present bilaterally, which may be infectious or inflammatory. Upper Abdomen: A few scattered hypodensities are present in the liver which are too small to further characterize. There is a cyst in the left kidney. No acute abnormality is seen. Musculoskeletal: Degenerative changes are present in the thoracic spine. No acute osseous abnormality is seen. Review of the MIP images confirms the above findings. IMPRESSION: 1. No evidence of pulmonary embolism. 2. Small left pneumothorax of approximally 5-10%. 3. Bronchial wall thickening bilaterally, which may be infectious or inflammatory. 4. Atelectasis at the lung bases bilaterally. 5. Coronary artery calcifications and aortic atherosclerosis. Electronically Signed: By: Leita Birmingham M.D. On: 09/03/2024 15:45     Medical Consultants:   None.   Subjective:    Kaelah Hayashi relates her breathing is slightly better.  Objective:    Vitals:   09/04/24 2008 09/05/24 0500 09/05/24 0509 09/05/24 0757  BP: 124/67 117/62    Pulse: 92 97    Resp: 20 19    Temp: 97.7 F (36.5 C) 97.9 F (36.6 C)    TempSrc: Oral Oral    SpO2: 94% 92%  95%  Weight:   94.4 kg   Height:       SpO2: 95 % O2 Flow Rate (L/min): 4 L/min FiO2 (%): 40 %   Intake/Output Summary (Last 24 hours) at 09/05/2024 9187 Last data filed at 09/05/2024 0546 Gross per 24 hour  Intake  200 ml  Output --  Net 200 ml   Filed Weights   09/03/24 0550 09/04/24 0513 09/05/24 0509  Weight: 93.1 kg 94.1 kg 94.4  kg    Exam: General exam: In no acute distress. Respiratory system: Good air movement and clear to auscultation. Cardiovascular system: S1 & S2 heard, RRR. No JVD. Gastrointestinal system: Abdomen is nondistended, soft and nontender.  Extremities: No pedal edema. Skin: No rashes, lesions or ulcers Psychiatry: Judgement and insight appear normal. Mood & affect appropriate. Data Reviewed:    Labs: Basic Metabolic Panel: Recent Labs  Lab 08/31/24 0942 09/01/24 0823 09/02/24 0342 09/03/24 1253 09/04/24 0414  NA 142 142 140 140 137  K 4.2 4.2 4.1 3.9 4.4  CL 98 95* 94* 94* 95*  CO2 40* 36* 37* 39* 31  GLUCOSE 96 135* 143* 130* 137*  BUN 34* 33* 29* 27* 25*  CREATININE 0.62 0.71 0.69 0.99 0.73  CALCIUM 9.0 9.1 9.1 9.4 9.2  MG 2.6* 2.2 2.4 2.4 2.7*   GFR Estimated Creatinine Clearance: 79.7 mL/min (by C-G formula based on SCr of 0.73 mg/dL). Liver Function Tests: No results for input(s): AST, ALT, ALKPHOS, BILITOT, PROT, ALBUMIN in the last 168 hours.  No results for input(s): LIPASE, AMYLASE in the last 168 hours. No results for input(s): AMMONIA in the last 168 hours. Coagulation profile No results for input(s): INR, PROTIME in the last 168 hours. COVID-19 Labs  No results for input(s): DDIMER, FERRITIN, LDH, CRP in the last 72 hours.  Lab Results  Component Value Date   SARSCOV2NAA NEGATIVE 08/28/2024    CBC: Recent Labs  Lab 08/31/24 0942 09/02/24 0342 09/04/24 0414  WBC 11.1* 8.4 13.3*  HGB 16.3* 16.5* 16.1*  HCT 51.7* 51.5* 50.3*  MCV 104.0* 102.6* 101.6*  PLT 183 185 172   Cardiac Enzymes: No results for input(s): CKTOTAL, CKMB, CKMBINDEX, TROPONINI in the last 168 hours. BNP (last 3 results) Recent Labs    08/28/24 1635 08/29/24 0305 09/03/24 1253  PROBNP 187.0 237.0 <50.0   CBG: No results for input(s): GLUCAP in the last 168 hours. D-Dimer: No results for input(s): DDIMER in the last 72  hours. Hgb A1c: No results for input(s): HGBA1C in the last 72 hours. Lipid Profile: No results for input(s): CHOL, HDL, LDLCALC, TRIG, CHOLHDL, LDLDIRECT in the last 72 hours. Thyroid  function studies: No results for input(s): TSH, T4TOTAL, T3FREE, THYROIDAB in the last 72 hours.  Invalid input(s): FREET3 Anemia work up: No results for input(s): VITAMINB12, FOLATE, FERRITIN, TIBC, IRON, RETICCTPCT in the last 72 hours. Sepsis Labs: Recent Labs  Lab 08/31/24 9057 09/02/24 0342 09/04/24 0414  WBC 11.1* 8.4 13.3*   Microbiology Recent Results (from the past 240 hours)  Culture, blood (routine x 2)     Status: None   Collection Time: 08/28/24  4:35 PM   Specimen: BLOOD  Result Value Ref Range Status   Specimen Description   Final    BLOOD RIGHT ANTECUBITAL Performed at [facility], [address] [place], [place] [address]    Special Requests   Final    BOTTLES DRAWN AEROBIC AND ANAEROBIC Blood Culture results may not be optimal due to an inadequate volume of blood received in culture bottles Performed at [facility], [address] [place], [place] [address]    Culture   Final    NO GROWTH 5 DAYS Performed at [facility], [address] [place], [place] [address]  Report Status 09/02/2024 FINAL  Final  Resp panel by RT-PCR (RSV, Flu A&B, Covid) Anterior Nasal Swab     Status: None   Collection Time: 08/28/24  4:35 PM   Specimen: Anterior Nasal Swab  Result Value Ref Range Status   SARS Coronavirus 2 by RT PCR NEGATIVE NEGATIVE Final    Comment: (NOTE) SARS-CoV-2 target nucleic acids are NOT DETECTED.  The SARS-CoV-2 RNA is generally detectable in upper respiratory specimens during the acute phase of infection. The lowest concentration of SARS-CoV-2 viral copies this assay can detect is 138 copies/mL. A negative result does not preclude SARS-Cov-2 infection and should not be used as the sole basis  for treatment or other patient management decisions. A negative result may occur with  improper specimen collection/handling, submission of specimen other than nasopharyngeal swab, presence of viral mutation(s) within the areas targeted by this assay, and inadequate number of viral copies(<138 copies/mL). A negative result must be combined with clinical observations, patient history, and epidemiological information. The expected result is Negative.  Fact Sheet for Patients:  bloggercourse.com  Fact Sheet for Healthcare Providers:  seriousbroker.it  This test is no t yet approved or cleared by the [place]  FDA and  has been authorized for detection and/or diagnosis of SARS-CoV-2 by FDA under an Emergency Use Authorization (EUA). This EUA will remain  in effect (meaning this test can be used) for the duration of the COVID-19 declaration under Section 564(b)(1) of the Act, 21 U.S.C.section 360bbb-3(b)(1), unless the authorization is terminated  or revoked sooner.       Influenza A by PCR NEGATIVE NEGATIVE Final   Influenza B by PCR NEGATIVE NEGATIVE Final    Comment: (NOTE) The Xpert Xpress SARS-CoV-2/FLU/RSV plus assay is intended as an aid in the diagnosis of influenza from Nasopharyngeal swab specimens and should not be used as a sole basis for treatment. Nasal washings and aspirates are unacceptable for Xpert Xpress SARS-CoV-2/FLU/RSV testing.  Fact Sheet for Patients: bloggercourse.com  Fact Sheet for Healthcare Providers: seriousbroker.it  This test is not yet approved or cleared by the [place]  FDA and has been authorized for detection and/or diagnosis of SARS-CoV-2 by FDA under an Emergency Use Authorization (EUA). This EUA will remain in effect (meaning this test can be used) for the duration of the COVID-19 declaration under Section 564(b)(1) of the Act, 21  U.S.C. section 360bbb-3(b)(1), unless the authorization is terminated or revoked.     Resp Syncytial Virus by PCR NEGATIVE NEGATIVE Final    Comment: (NOTE) Fact Sheet for Patients: bloggercourse.com  Fact Sheet for Healthcare Providers: seriousbroker.it  This test is not yet approved or cleared by the [place]  FDA and has been authorized for detection and/or diagnosis of SARS-CoV-2 by FDA under an Emergency Use Authorization (EUA). This EUA will remain in effect (meaning this test can be used) for the duration of the COVID-19 declaration under Section 564(b)(1) of the Act, 21 U.S.C. section 360bbb-3(b)(1), unless the authorization is terminated or revoked.  Performed at [facility], [address] [place], [place] [address]   Culture, blood (routine x 2)     Status: None   Collection Time: 08/28/24  4:40 PM   Specimen: BLOOD RIGHT HAND  Result Value Ref Range Status   Specimen Description   Final    BLOOD RIGHT HAND Performed at [facility], [address], [place], [place] [address]    Special Requests   Final    BOTTLES DRAWN  AEROBIC AND ANAEROBIC Blood Culture results may not be optimal due to an inadequate volume of blood received in culture bottles Performed at [facility], [address] [place], [place] [address]    Culture   Final    NO GROWTH 5 DAYS Performed at [facility], [address], [place], [place] [address]    Report Status 09/02/2024 FINAL  Final  MRSA Next Gen by PCR, Nasal     Status: None   Collection Time: 08/29/24 12:26 AM   Specimen: Nasal Mucosa; Nasal Swab  Result Value Ref Range Status   MRSA by PCR Next Gen NOT DETECTED NOT DETECTED Final    Comment: (NOTE) The GeneXpert MRSA Assay (FDA approved for NASAL specimens only), is one component of a comprehensive MRSA colonization surveillance program. It is not intended to diagnose MRSA  infection nor to guide or monitor treatment for MRSA infections. Test performance is not FDA approved in patients less than 38 years old. Performed at [facility], [address] [place], [place] [address]      Medications:    arformoterol   15 mcg Nebulization BID   azithromycin   250 mg Oral q1800   benzonatate   200 mg Oral TID   budesonide  (PULMICORT ) nebulizer solution  0.25 mg Nebulization BID   vitamin B-12  1,000 mcg Oral Daily   dextromethorphan   30 mg Oral BID   enoxaparin  (LOVENOX ) injection  40 mg Subcutaneous Q24H   furosemide   40 mg Oral Daily   guaiFENesin   600 mg Oral BID   magic mouthwash  5 mL Oral QID   methylPREDNISolone  (SOLU-MEDROL ) injection  40 mg Intravenous BID   mouth rinse  15 mL Mouth Rinse 4 times per day   revefenacin   175 mcg Nebulization Daily   sodium chloride  HYPERTONIC  4 mL Nebulization TID   Continuous Infusions:    LOS: 7 days   Erle Odell Castor  [facility]  09/05/2024, 8:12 AM

## 2024-09-05 NOTE — Progress Notes (Signed)
 Mobility Specialist - Progress Note  ([place] 4L) Pre-mobility: 97 bpm HR, 98% SpO2 During mobility: 152 bpm HR, 92% SpO2 Post-mobility: 81 bpm HR, 96% SPO2   09/05/24 1511  Mobility  Activity Ambulated with assistance  Level of Assistance Standby assist, set-up cues, supervision of patient - no hands on  Assistive Device None  Distance Ambulated (ft) 150 ft  Range of Motion/Exercises Active  Activity Response Tolerated fair  Mobility visit 1 Mobility  Mobility Specialist Start Time (ACUTE ONLY) 1458  Mobility Specialist Stop Time (ACUTE ONLY) 1511  Mobility Specialist Time Calculation (min) (ACUTE ONLY) 13 min   Pt was found on recliner chair and agreeable to mobilize. Had x1 brief standing rest break due to increase HR. Able to decrease to 106 bpm within 1 min. At EOS returned to recliner chair with all needs met. Husband in room.   Erminio Leos,  Mobility Specialist Can be reached via Secure Chat

## 2024-09-05 NOTE — Progress Notes (Addendum)
 NAME:  Stacey [name], MRN:  [id number], DOB:  1956/06/01, LOS: 7 ADMISSION DATE:  08/28/2024, CONSULTATION DATE:  09/05/2024 REFERRING MD:  Dr. Tobie, CHIEF COMPLAINT:  pneumothorax   History of Present Illness:  Stacey [name] is a 68 y/o F with a PMH significant for chronic diastolic heart failure, moderate OSA, emphysema (no prior PFTs), nicotine  dependence with current use who presents for acute on chronic hypercapnic & hypoxic respiratory failure in the setting of acute on chronic diastolic heart failure and COPD exacerbation now found to have small left pneumothorax. Pulmonology consulted for evaluation and management of the latter.  The Stacey [name] is a 68 year old with COPD who presents with worsening respiratory symptoms andpneumothorax. Stacey [name] is accompanied by her husband, who has been with her throughout the hospital stay.  Stacey [name] initially experienced symptoms resembling a severe chest cold, including a sore throat, chest congestion, and difficulty breathing. Despite using cough drops, her symptoms did not improve, and her condition worsened, leading to an ER visit due to severe symptoms such as difficulty walking and cyanosis of the hands. Stacey [name] was subsequently transferred to the ICU step-down unit.  Stacey [name] has a history of COPD and has recently experienced symptoms including wheezing, congestion, and coughing. Prior to this episode, Stacey [name] was not on any breathing treatments at home. In the hospital, Stacey [name] has been receiving nebulizer treatments and steroids and, according to her spouse, was on a BiPAP machine for the first day and a half of her ICU stay.  Stacey [name] has a history of moderate sleep apnea. Stacey [name] reports that her sleep study was unsuccessful because Stacey [name] only slept one hour, which Stacey [name] attributes to nervousness and construction noise outside the window. Stacey [name] has not been using a CPAP machine recently.  Stacey [name] has a history of smoking for approximately 50 years, having started as a teenager. Stacey [name] has not  smoked for six days since her hospital admission and is currently using a nicotine  patch.  In April, Stacey [name] experienced significant swelling in her feet and ankles, which led to a series of tests. Stacey [name] is currently taking Lasix  and spironolactone  for this condition.  Stacey [name] was informed that Stacey [name] has a small pneumothorax on the left side. Stacey [name] has been using an incentive spirometer to help expand her lungs.  Stacey [name] has been around elderly family members recently but reports no one else being sick. Stacey [name] has no children or grandchildren and enjoys social activities such as watching football and playing pool.  Pertinent  Medical History   Past Medical History:  Diagnosis Date   Aortic atherosclerosis    Breast cancer (HCC) 09/22/2006   L side, s/p lumpectomy, chemotherapy and radiation   Centrilobular emphysema (HCC)    Chronic combined systolic and diastolic heart failure (HCC)    Edema    Leg swelling    OSA (obstructive sleep apnea)    Panic disorder    SOB (shortness of breath)    Tobacco abuse    Significant Hospital Events: Including procedures, antibiotic start and stop dates in addition to other pertinent events   12/13 --> worsening hypoxia, CTA Chest obtained, no PE, L small pneumothorax 12/14 --> AM CXR, no clear evidence of pneumothorax, weaned down to 5 L O2 by [place]  Interim History / Subjective:  Intermittently feeling dizzy today.  But breathing is better overall.  Denies any wheezing.  Objective    Blood pressure 106/63, pulse 97, temperature 97.9 F (36.6 C), temperature source Oral, resp. rate 19, height 5' 8 (1.727 m), weight  94.4 kg, SpO2 96%.    FiO2 (%):  [40 %] 40 %   Intake/Output Summary (Last 24 hours) at 09/05/2024 1034 Last data filed at 09/05/2024 0546 Gross per 24 hour  Intake 200 ml  Output --  Net 200 ml   Filed Weights   09/03/24 0550 09/04/24 0513 09/05/24 0509  Weight: 93.1 kg 94.1 kg 94.4 kg    Examination: General: Elderly female not in any  distress on nasal cannula. Lungs: clear to auscultation bilaterally.  No wheezing appreciated. Heart: regular rate rhythm, no murmur appreciated.  Abdomen: non tender, non distended. Normal BS.  Neuro: axox 3.  Moving all extremities.   Resolved problem list   Assessment and Plan   Acute Hypoxic Respiratory Failure due to COPD exacerbation Atelectasis Pneumothorax Acute exacerbation likely due to viral infection. Smoking cessation critical to prevent future exacerbations. - Continue ICS/LAMA/LABA.  Will need to transition to Trelegy/Breztri  [depending on what insurance covers] on discharge - Continue bronchodilators, mucolytics, and chest physiotherapy - Continue steroids + ppx abx course.  Transition to daily Methylpred. - Encouraged smoking cessation and nicotine  patch use. - Will schedule outpatient follow-up in pulmonary office close to discharge.  Left-sided secondary spontaneous pneumothorax Chest x-ray and CT scan both reviewed by me.  Scout images of CT scan shows blunting of left costophrenic angle.  CT in addition shows underlying emphysematous changes along with left basilar atelectasis/scarring.  This blunting continues on current chest x-ray done on 12/15 which shows persistent left small apical pneumothorax which is not amenable to any interventions. Pneumothorax likely secondary to coughing from COPD exacerbation. - Continue oxygen supplementation for now due to pneumothorax.  Once confirmed stability can transition to goal of SpO2 88-92% - Instructed on incentive spirometer use ten times hourly while awake. - Will order CT scan for tomorrow to evaluate for pneumothorax as well as left ?pleural effusion.  Left costophrenic blunting is likely due to atelectasis. - Will resume respiratory assist [RAD] device tomorrow.  Moderate OSA: - Will resume RAD tomorrow.  Acute on chronic diastolic heart failure > Appears relatively euvolemic on examination. - Agree with  maintenance diuretics.  Stacey [name] Lines/Drains/Airways Status     Active Line/Drains/Airways     Name Placement date Placement time Site Days   Peripheral IV 08/28/24 20 G 1 Right Antecubital 08/28/24  1632  Antecubital  8   Peripheral IV 08/28/24 20 G Posterior;Right Hand 08/28/24  1653  Hand  8            Labs   CBC: Recent Labs  Lab 08/31/24 0942 09/02/24 0342 09/04/24 0414  WBC 11.1* 8.4 13.3*  HGB 16.3* 16.5* 16.1*  HCT 51.7* 51.5* 50.3*  MCV 104.0* 102.6* 101.6*  PLT 183 185 172    Basic Metabolic Panel: Recent Labs  Lab 08/31/24 0942 09/01/24 0823 09/02/24 0342 09/03/24 1253 09/04/24 0414  NA 142 142 140 140 137  K 4.2 4.2 4.1 3.9 4.4  CL 98 95* 94* 94* 95*  CO2 40* 36* 37* 39* 31  GLUCOSE 96 135* 143* 130* 137*  BUN 34* 33* 29* 27* 25*  CREATININE 0.62 0.71 0.69 0.99 0.73  CALCIUM 9.0 9.1 9.1 9.4 9.2  MG 2.6* 2.2 2.4 2.4 2.7*   GFR: Estimated Creatinine Clearance: 79.7 mL/min (by C-G formula based on SCr of 0.73 mg/dL). Recent Labs  Lab 08/31/24 0942 09/02/24 0342 09/04/24 0414  WBC 11.1* 8.4 13.3*    Liver Function Tests: No results for input(s): AST, ALT, ALKPHOS, BILITOT,  PROT, ALBUMIN in the last 168 hours.  No results for input(s): LIPASE, AMYLASE in the last 168 hours. No results for input(s): AMMONIA in the last 168 hours.  ABG    Component Value Date/Time   HCO3 38.6 (H) 08/29/2024 0659   TCO2 39 (H) 08/28/2024 2026   O2SAT 79 08/29/2024 0659     Coagulation Profile: No results for input(s): INR, PROTIME in the last 168 hours.  Cardiac Enzymes: No results for input(s): CKTOTAL, CKMB, CKMBINDEX, TROPONINI in the last 168 hours.  HbA1C: Hgb A1c MFr Bld  Date/Time Value Ref Range Status  12/28/2023 03:35 PM 6.5 4.6 - 6.5 % Final    Comment:    Glycemic Control Guidelines for People with Diabetes:Non Diabetic:  <6%Goal of Therapy: <7%Additional Action Suggested:  >8%   03/10/2023 10:28 AM 6.4  4.6 - 6.5 % Final    Comment:    Glycemic Control Guidelines for People with Diabetes:Non Diabetic:  <6%Goal of Therapy: <7%Additional Action Suggested:  >8%     CBG: No results for input(s): GLUCAP in the last 168 hours.  Review of Systems:   Not obtained  Past Medical History:  Stacey [name],  has a past medical history of Aortic atherosclerosis, Breast cancer (HCC) (09/22/2006), Centrilobular emphysema (HCC), Chronic combined systolic and diastolic heart failure (HCC), Edema, Leg swelling, OSA (obstructive sleep apnea), Panic disorder, SOB (shortness of breath), and Tobacco abuse.   Surgical History:   Past Surgical History:  Procedure Laterality Date   BREAST LUMPECTOMY  2008     Social History:   reports that Stacey [name] has been smoking cigarettes. Stacey [name] has a 67.5 pack-year smoking history. Stacey [name] has never used smokeless tobacco. Stacey [name] reports current alcohol use. Stacey [name] reports that Stacey [name] does not use drugs.   Family History:  Her family history includes Breast cancer in her paternal aunt and sister; Celiac disease in her paternal grandmother; Colon cancer in her paternal aunt; Diabetes in her mother and another family member; Heart disease in her brother, father, maternal grandfather, and paternal grandfather.   Allergies Allergies[1]   Home Medications  Prior to Admission medications  Medication Sig Start Date End Date Taking? Authorizing Provider  aspirin EC 81 MG tablet Take 81 mg by mouth daily. Swallow whole.   Yes [provider]  Calcium-Magnesium -Vitamin D  600-40-500 MG-MG-UNIT TB24 Take 1 tablet by mouth daily.   Yes [provider]  Cholecalciferol (VITAMIN D -3) 1000 UNITS CAPS Take 1 capsule by mouth daily.   Yes [provider]  diazepam  (VALIUM ) 5 MG tablet Take 1 tablet (5 mg total) by mouth daily as needed for anxiety. 12/31/20  Yes Rollene Almarie LABOR, MD  furosemide  (LASIX ) 40 MG tablet Take 1 tablet (40 mg total) by mouth daily. 01/20/24  Yes Court Dorn PARAS, MD  Multiple Vitamins-Minerals (CENTRUM SILVER) tablet Take 1 tablet by mouth daily.   Yes [provider]  spironolactone  (ALDACTONE ) 25 MG tablet Take 1 tablet (25 mg total) by mouth daily. 03/03/24  Yes Goodrich, Callie E, PA-C  vitamin B-12 (CYANOCOBALAMIN ) 1000 MCG tablet Take 1,000 mcg by mouth daily.   Yes [provider]  vitamin C (ASCORBIC ACID) 500 MG tablet Take 500 mg by mouth daily.   Yes [provider]    Total care time 45 minutes   Care time was exclusive of separately billable procedures and treating other patients.  Care was necessary to treat or prevent imminent or life-threatening deterioration.   Care was time spent personally by me  on the following activities: development of treatment plan with Stacey [name] and/or surrogate as well as nursing, discussions with consultants, evaluation of Stacey [name]'s response to treatment, examination of Stacey [name], obtaining history from Stacey [name] or surrogate, ordering and performing treatments and interventions, ordering and review of laboratory studies, ordering and review of radiographic studies and pulse oximetry.   Sammi JONETTA Fredericks, MD Pulmonary, Critical Care and Sleep Attending.  Pager: [phone]  09/05/2024, 10:41 AM            [1]  Allergies Allergen Reactions   Amoxicillin     REACTION: rash   Penicillins     REACTION: rash

## 2024-09-06 ENCOUNTER — Encounter (HOSPITAL_COMMUNITY): Payer: Self-pay | Admitting: Internal Medicine

## 2024-09-06 ENCOUNTER — Inpatient Hospital Stay (HOSPITAL_COMMUNITY)

## 2024-09-06 ENCOUNTER — Encounter

## 2024-09-06 ENCOUNTER — Ambulatory Visit: Admitting: Pulmonary Disease

## 2024-09-06 ENCOUNTER — Telehealth: Payer: Self-pay

## 2024-09-06 NOTE — Telephone Encounter (Signed)
 Appointment with Dr. Neda requested.

## 2024-09-06 NOTE — Progress Notes (Signed)
 Mobility Specialist - Progress Note  (RA) Pre-mobility: 85 bpm HR, 93% SpO2 During mobility: 90 bpm HR, 89% SpO2 Post-mobility: 83 bpm HR, 93% SPO2   09/06/24 1228  Mobility  Activity Ambulated with assistance  Level of Assistance Standby assist, set-up cues, supervision of patient - no hands on  Assistive Device None  Distance Ambulated (ft) 200 ft  Range of Motion/Exercises Active  Activity Response Tolerated well  Mobility visit 1 Mobility  Mobility Specialist Start Time (ACUTE ONLY) 1215  Mobility Specialist Stop Time (ACUTE ONLY) 1228  Mobility Specialist Time Calculation (min) (ACUTE ONLY) 13 min   Pt was found on recliner chair and agreeable to mobilize. Encouraged pursed lip breathing throughout session. At EOS returned to recliner chair with all needs met. Call bell in reach and husband in room.   Erminio Leos,  Mobility Specialist Can be reached via Secure Chat

## 2024-09-06 NOTE — Progress Notes (Signed)
 [facility] PROGRESS NOTE    Progress Note  Stacey [name]  FMW:[id number] DOB: [birth year] DOA: 08/28/2024 PCP: Rollene Almarie LABOR, MD    Brief Narrative:   Stacey [name] is an 68 y.o. female past medical history HFpEF, active smoker obesity breast cancer comes in complaining of cough and shortness of breath found to be in acute respiratory failure with hypoxia due to COPD exacerbation. CT angio on 09/03/2024 showed no evidence of PE, small pneumothorax 5 to 10% and small bronchial wall thickening. Assessment/Plan:   Acute hypoxic on chronic hypercapnic respiratory failure secondary to acute bronchitis and small pneumothorax: She has completed course of antibiotics and steroids. Still requiring 4 L to keep saturations greater 95%. Pulmonary was consulted they also recommended to avoid BiPAP. Continue budesonide  Brovana  Yupelri .   CT of the chest shows resolved pneumothorax, no pleural effusion.  Some narrowing of atelectasis on the left Appreciate pulmonary's assistance.  Moderate obstructive sleep apnea/possibly obesity hypoventilation syndrome: Patient sleep study as an outpatient and she was hypoxic. ABG showed pCO2 of 70, she could not tolerate BiPAP due to small pneumothorax. Continue nocturnal oxygen supplementation.  Acute on chronic HFpEF: 2D echo showed diastolic dysfunction with preserved EF. She was started on IV Lasix  now has been switched to oral Lasix . Aldactone  was held due to hyperkalemia.  Active tobacco abuse Noted.  Macrocytosis/polycythemia: Likely related to B12 deficiency currently on supplementation. Will need to check saturations with ambulation she has an elevated hemoglobin which may qualify her for oxygen due to hypoxia.   DVT prophylaxis: lovenox  Family Communication:none Status is: Inpatient Remains inpatient appropriate because: Acute respiratory failure with hypoxia secondary to acute bronchitis and small pneumothorax    Code  Status:     Code Status Orders  (From admission, onward)           Start     Ordered   08/29/24 0020  Full code  Continuous       Question:  By:  Answer:  Consent: discussion documented in EHR   08/29/24 0020           Code Status History     This patient has a current code status but no historical code status.         IV Access:   Peripheral IV   Procedures and diagnostic studies:   CT CHEST WO CONTRAST Result Date: 09/06/2024 CLINICAL DATA:  Left-sided pneumothorax. EXAM: CT CHEST WITHOUT CONTRAST TECHNIQUE: Multidetector CT imaging of the chest was performed following the standard protocol without IV contrast. RADIATION DOSE REDUCTION: This exam was performed according to the departmental dose-optimization program which includes automated exposure control, adjustment of the mA and/or kV according to patient size and/or use of iterative reconstruction technique. COMPARISON:  11/05/2023 and 12/29/2023 FINDINGS: Cardiovascular: Heart is normal size. Calcified plaque over the left anterior descending coronary artery. Thoracic aorta is normal in caliber. There is calcified plaque over the descending thoracic aorta. Pulmonary arterial system is unremarkable on this noncontrast exam. Remaining vascular structures are unremarkable. Mediastinum/Nodes: No mediastinal or hilar adenopathy. Remaining mediastinal structures are normal. Lungs/Pleura: Lungs are adequately inflated. Atelectatic change over the anteromedial right middle lobe as well as linear atelectasis right lower lobe. Previously seen left-sided pneumothorax is not visualized. Atelectatic change over the lower lobe and lingula. Mild posterior dependent atelectasis over the posterior left upper lobe. No effusion. Airways are unremarkable. Upper Abdomen: Minimal calcified plaque over the visualized upper abdominal aorta. Few small liver hypodensities unchanged and likely cysts, but too  small to characterize stable partially  visualized cyst over the upper pole left kidney. Musculoskeletal: No focal abnormality. IMPRESSION: 1. No acute cardiopulmonary disease. Previously seen left-sided pneumothorax is not visualized. 2. Atelectatic change over the right middle lobe, right lower lobe, left lower lobe and lingula. 3. Aortic atherosclerosis. Atherosclerotic coronary artery disease. 4. Few small liver hypodensities unchanged and likely cysts, but too small to characterize. Stable partially visualized cyst over the upper pole left kidney. Aortic Atherosclerosis (ICD10-I70.0). Electronically Signed   By: Toribio Agreste M.D.   On: 09/06/2024 08:10   DG CHEST PORT 1 VIEW Result Date: 09/05/2024 EXAM: 1 AP VIEW XRAY OF THE CHEST 09/05/2024 05:28:00 AM COMPARISON: 09/04/2024 CLINICAL HISTORY: Pneumothorax FINDINGS: LUNGS AND PLEURA: Increased moderate left pleural effusion. Trace left apical pneumothorax, similar. Mildly increased interstitial markings. Increased left basilar homogeneous opacities. HEART AND MEDIASTINUM: Surgical clips over left hilum. Atherosclerotic calcifications. No acute abnormality of the cardiac and mediastinal silhouettes. BONES AND SOFT TISSUES: No acute osseous abnormality. IMPRESSION: 1. Trace left apical pneumothorax, similar to prior study. 2. Increased moderate left pleural effusion. 3. Increased left basilar homogeneous opacities. Electronically signed by: Waddell Calk MD 09/05/2024 06:53 AM EST RP Workstation: [id number]   DG CHEST PORT 1 VIEW Result Date: 09/04/2024 CLINICAL DATA:  [id number] Pneumothorax [id number] EXAM: PORTABLE CHEST 1 VIEW COMPARISON:  September 04, 2024, September 03, 2024 FINDINGS: The cardiomediastinal silhouette is unchanged in contour.Sclerotic calcifications. Small LEFT pleural effusion. Favored trace LEFT apical pneumothorax. Bibasilar linear opacities, likely atelectasis. IMPRESSION: 1. Favored trace LEFT apical pneumothorax. 2. Small LEFT pleural effusion. Electronically Signed   By:  Corean Salter M.D.   On: 09/04/2024 15:59     Medical Consultants:   None.   Subjective:    Stacey [name] relates her breathing is better.  Objective:    Vitals:   09/05/24 1959 09/05/24 2002 09/06/24 0402 09/06/24 0409  BP:   108/64   Pulse:   64   Resp:   20   Temp:   98 F (36.7 C)   TempSrc:   Oral   SpO2: 97% 97% 97%   Weight:    94.5 kg  Height:       SpO2: 97 % O2 Flow Rate (L/min): 4 L/min FiO2 (%): 40 %  No intake or output data in the 24 hours ending 09/06/24 0838  Filed Weights   09/04/24 0513 09/05/24 0509 09/06/24 0409  Weight: 94.1 kg 94.4 kg 94.5 kg    Exam: General exam: In no acute distress. Respiratory system: Good air movement and clear to auscultation. Cardiovascular system: S1 & S2 heard, RRR. No JVD. Gastrointestinal system: Abdomen is nondistended, soft and nontender.  Extremities: No pedal edema. Skin: No rashes, lesions or ulcers Psychiatry: Judgement and insight appear normal. Mood & affect appropriate. Data Reviewed:    Labs: Basic Metabolic Panel: Recent Labs  Lab 08/31/24 0942 09/01/24 0823 09/02/24 0342 09/03/24 1253 09/04/24 0414  NA 142 142 140 140 137  K 4.2 4.2 4.1 3.9 4.4  CL 98 95* 94* 94* 95*  CO2 40* 36* 37* 39* 31  GLUCOSE 96 135* 143* 130* 137*  BUN 34* 33* 29* 27* 25*  CREATININE 0.62 0.71 0.69 0.99 0.73  CALCIUM 9.0 9.1 9.1 9.4 9.2  MG 2.6* 2.2 2.4 2.4 2.7*   GFR Estimated Creatinine Clearance: 79.7 mL/min (by C-G formula based on SCr of 0.73 mg/dL). Liver Function Tests: No results for input(s): AST, ALT, ALKPHOS, BILITOT, PROT, ALBUMIN in the last 168 hours.  No results for input(s): LIPASE, AMYLASE in the last 168 hours. No results for input(s): AMMONIA in the last 168 hours. Coagulation profile No results for input(s): INR, PROTIME in the last 168 hours. COVID-19 Labs  No results for input(s): DDIMER, FERRITIN, LDH, CRP in the last 72 hours.  Lab Results   Component Value Date   SARSCOV2NAA NEGATIVE 08/28/2024    CBC: Recent Labs  Lab 08/31/24 0942 09/02/24 0342 09/04/24 0414  WBC 11.1* 8.4 13.3*  HGB 16.3* 16.5* 16.1*  HCT 51.7* 51.5* 50.3*  MCV 104.0* 102.6* 101.6*  PLT 183 185 172   Cardiac Enzymes: No results for input(s): CKTOTAL, CKMB, CKMBINDEX, TROPONINI in the last 168 hours. BNP (last 3 results) Recent Labs    08/28/24 1635 08/29/24 0305 09/03/24 1253  PROBNP 187.0 237.0 <50.0   CBG: No results for input(s): GLUCAP in the last 168 hours. D-Dimer: No results for input(s): DDIMER in the last 72 hours. Hgb A1c: No results for input(s): HGBA1C in the last 72 hours. Lipid Profile: No results for input(s): CHOL, HDL, LDLCALC, TRIG, CHOLHDL, LDLDIRECT in the last 72 hours. Thyroid  function studies: No results for input(s): TSH, T4TOTAL, T3FREE, THYROIDAB in the last 72 hours.  Invalid input(s): FREET3 Anemia work up: No results for input(s): VITAMINB12, FOLATE, FERRITIN, TIBC, IRON, RETICCTPCT in the last 72 hours. Sepsis Labs: Recent Labs  Lab 08/31/24 9057 09/02/24 0342 09/04/24 0414  WBC 11.1* 8.4 13.3*   Microbiology Recent Results (from the past 240 hours)  Culture, blood (routine x 2)     Status: None   Collection Time: 08/28/24  4:35 PM   Specimen: BLOOD  Result Value Ref Range Status   Specimen Description   Final    BLOOD RIGHT ANTECUBITAL Performed at [facility], [address] [place], [place] [address]    Special Requests   Final    BOTTLES DRAWN AEROBIC AND ANAEROBIC Blood Culture results may not be optimal due to an inadequate volume of blood received in culture bottles Performed at [facility], [address] [place], [place] [address]    Culture   Final    NO GROWTH 5 DAYS Performed at [facility], [address], [place], [place] [address]    Report Status 09/02/2024 FINAL  Final  Resp panel by  RT-PCR (RSV, Flu A&B, Covid) Anterior Nasal Swab     Status: None   Collection Time: 08/28/24  4:35 PM   Specimen: Anterior Nasal Swab  Result Value Ref Range Status   SARS Coronavirus 2 by RT PCR NEGATIVE NEGATIVE Final    Comment: (NOTE) SARS-CoV-2 target nucleic acids are NOT DETECTED.  The SARS-CoV-2 RNA is generally detectable in upper respiratory specimens during the acute phase of infection. The lowest concentration of SARS-CoV-2 viral copies this assay can detect is 138 copies/mL. A negative result does not preclude SARS-Cov-2 infection and should not be used as the sole basis for treatment or other patient management decisions. A negative result may occur with  improper specimen collection/handling, submission of specimen other than nasopharyngeal swab, presence of viral mutation(s) within the areas targeted by this assay, and inadequate number of viral copies(<138 copies/mL). A negative result must be combined with clinical observations, patient history, and epidemiological information. The expected result is Negative.  Fact Sheet for Patients:  bloggercourse.com  Fact Sheet for Healthcare Providers:  seriousbroker.it  This test is no t yet approved or cleared by the [place]  FDA and  has been authorized for detection and/or diagnosis of SARS-CoV-2 by FDA under an Emergency Use Authorization (EUA). This EUA will remain  in effect (meaning this test can be used) for the duration of the COVID-19 declaration under Section 564(b)(1) of the Act, 21 U.S.C.section 360bbb-3(b)(1), unless the authorization is terminated  or revoked sooner.       Influenza A by PCR NEGATIVE NEGATIVE Final   Influenza B by PCR NEGATIVE NEGATIVE Final    Comment: (NOTE) The Xpert Xpress SARS-CoV-2/FLU/RSV plus assay is intended as an aid in the diagnosis of influenza from Nasopharyngeal swab specimens and should not be used as a sole basis  for treatment. Nasal washings and aspirates are unacceptable for Xpert Xpress SARS-CoV-2/FLU/RSV testing.  Fact Sheet for Patients: bloggercourse.com  Fact Sheet for Healthcare Providers: seriousbroker.it  This test is not yet approved or cleared by the [place]  FDA and has been authorized for detection and/or diagnosis of SARS-CoV-2 by FDA under an Emergency Use Authorization (EUA). This EUA will remain in effect (meaning this test can be used) for the duration of the COVID-19 declaration under Section 564(b)(1) of the Act, 21 U.S.C. section 360bbb-3(b)(1), unless the authorization is terminated or revoked.     Resp Syncytial Virus by PCR NEGATIVE NEGATIVE Final    Comment: (NOTE) Fact Sheet for Patients: bloggercourse.com  Fact Sheet for Healthcare Providers: seriousbroker.it  This test is not yet approved or cleared by the [place]  FDA and has been authorized for detection and/or diagnosis of SARS-CoV-2 by FDA under an Emergency Use Authorization (EUA). This EUA will remain in effect (meaning this test can be used) for the duration of the COVID-19 declaration under Section 564(b)(1) of the Act, 21 U.S.C. section 360bbb-3(b)(1), unless the authorization is terminated or revoked.  Performed at [facility], [address] [place], [place] [address]   Culture, blood (routine x 2)     Status: None   Collection Time: 08/28/24  4:40 PM   Specimen: BLOOD RIGHT HAND  Result Value Ref Range Status   Specimen Description   Final    BLOOD RIGHT HAND Performed at [facility], [address] [place], [place] [address]    Special Requests   Final    BOTTLES DRAWN AEROBIC AND ANAEROBIC Blood Culture results may not be optimal due to an inadequate volume of blood received in culture bottles Performed at [facility], [address]  [place], [place] [address]    Culture   Final    NO GROWTH 5 DAYS Performed at [facility], [address], [place], [place] [address]    Report Status 09/02/2024 FINAL  Final  MRSA Next Gen by PCR, Nasal     Status: None   Collection Time: 08/29/24 12:26 AM   Specimen: Nasal Mucosa; Nasal Swab  Result Value Ref Range Status   MRSA by PCR Next Gen NOT DETECTED NOT DETECTED Final    Comment: (NOTE) The GeneXpert MRSA Assay (FDA approved for NASAL specimens only), is one component of a comprehensive MRSA colonization surveillance program. It is not intended to diagnose MRSA infection nor to guide or monitor treatment for MRSA infections. Test performance is not FDA approved in patients less than 68 years old. Performed at [facility], [address], [place], [place] [address]      Medications:    arformoterol   15 mcg Nebulization BID   benzonatate   200 mg Oral TID  budesonide  (PULMICORT ) nebulizer solution  0.25 mg Nebulization BID   vitamin B-12  1,000 mcg Oral Daily   dextromethorphan   30 mg Oral BID   enoxaparin  (LOVENOX ) injection  40 mg Subcutaneous Q24H   furosemide   40 mg Oral Daily   guaiFENesin   600 mg Oral BID   magic mouthwash  5 mL Oral QID   mouth rinse  15 mL Mouth Rinse 4 times per day   revefenacin   175 mcg Nebulization Daily   sodium chloride  HYPERTONIC  4 mL Nebulization TID   Continuous Infusions:    LOS: 8 days   Stacey [name]  [facility]  09/06/2024, 8:38 AM

## 2024-09-06 NOTE — Progress Notes (Addendum)
 NAME:  Stacey [name], MRN:  [id number], DOB:  [birth year], LOS: 8 ADMISSION DATE:  08/28/2024, CONSULTATION DATE:  09/06/2024 REFERRING MD:  Dr. Tobie, CHIEF COMPLAINT:  pneumothorax   History of Present Illness:  Stacey [name] is a 68 y/o F with a PMH significant for chronic diastolic heart failure, moderate OSA, emphysema (no prior PFTs), nicotine  dependence with current use who presents for acute on chronic hypercapnic & hypoxic respiratory failure in the setting of acute on chronic diastolic heart failure and COPD exacerbation now found to have small left pneumothorax. Pulmonology consulted for evaluation and management of the latter.  The patient is a 68 year old with COPD who presents with worsening respiratory symptoms andpneumothorax. She is accompanied by her husband, who has been with her throughout the hospital stay.  She initially experienced symptoms resembling a severe chest cold, including a sore throat, chest congestion, and difficulty breathing. Despite using cough drops, her symptoms did not improve, and her condition worsened, leading to an ER visit due to severe symptoms such as difficulty walking and cyanosis of the hands. She was subsequently transferred to the ICU step-down unit.  She has a history of COPD and has recently experienced symptoms including wheezing, congestion, and coughing. Prior to this episode, she was not on any breathing treatments at home. In the hospital, she has been receiving nebulizer treatments and steroids and, according to her spouse, was on a BiPAP machine for the first day and a half of her ICU stay.  She has a history of moderate sleep apnea. She reports that her sleep study was unsuccessful because she only slept one hour, which she attributes to nervousness and construction noise outside the window. She has not been using a CPAP machine recently.  She has a history of smoking for approximately 50 years, having started as a teenager. She has not  smoked for six days since her hospital admission and is currently using a nicotine  patch.  In April, she experienced significant swelling in her feet and ankles, which led to a series of tests. She is currently taking Lasix  and spironolactone  for this condition.  She was informed that she has a small pneumothorax on the left side. She has been using an incentive spirometer to help expand her lungs.  She has been around elderly family members recently but reports no one else being sick. She has no children or grandchildren and enjoys social activities such as watching football and playing pool.  Pertinent  Medical History   Past Medical History:  Diagnosis Date   Aortic atherosclerosis    Breast cancer (HCC) 09/22/2006   L side, s/p lumpectomy, chemotherapy and radiation   Centrilobular emphysema (HCC)    Chronic combined systolic and diastolic heart failure (HCC)    Edema    Leg swelling    OSA (obstructive sleep apnea)    Panic disorder    SOB (shortness of breath)    Tobacco abuse    Significant Hospital Events: Including procedures, antibiotic start and stop dates in addition to other pertinent events   12/13 --> worsening hypoxia, CTA Chest obtained, no PE, L small pneumothorax 12/14 --> AM CXR, no clear evidence of pneumothorax, weaned down to 5 L O2 by [place]  Interim History / Subjective:  Doing well.  Had bout of coughing in the morning.  However her breathing feels at her baseline.  Objective    Blood pressure 108/64, pulse 64, temperature 98 F (36.7 C), temperature source Oral, resp. rate 20, height 5'  8 (1.727 m), weight 94.5 kg, SpO2 97%.       No intake or output data in the 24 hours ending 09/06/24 0730  Filed Weights   09/04/24 0513 09/05/24 0509 09/06/24 0409  Weight: 94.1 kg 94.4 kg 94.5 kg    Examination: General: Elderly female not in any distress on nasal cannula. Lungs: No wheeze appreciated.  Heart: regular rate rhythm, no murmur appreciated.   Abdomen: non tender, non distended. Normal BS.  Neuro: axox 3.  Moving all extremities.   Resolved problem list   Assessment and Plan   Acute Hypoxic Respiratory Failure due to COPD exacerbation Atelectasis Pneumothorax Acute exacerbation likely due to viral infection. Smoking cessation critical to prevent future exacerbations. - Continue ICS/LAMA/LABA > transition to Trelegy/Breztri  [depending on what insurance covers] on discharge - Continue bronchodilators, mucolytics, and chest physiotherapy - stop abx and steroids.  - Encouraged smoking cessation and nicotine  patch use.  1 [phone] quit NOW number provided. - Will schedule outpatient follow-up in pulmonary office close to discharge. - Walking oximetry to evaluate oxygen need on discharge  Left-sided secondary spontaneous pneumothorax Chest x-ray and CT scan both reviewed by me.  Scout images of CT scan shows blunting of left costophrenic angle.  CT in addition shows underlying emphysematous changes along with left basilar atelectasis/scarring.  This blunting continues on current chest x-ray done on 12/15 which shows persistent left small apical pneumothorax which is not amenable to any interventions.  Repeat CT 12/16 with resolution of pneumothorax and no pleural effusion on the left.  However there is a portion of atelectasis on the left lingula which will need follow-up CT scan as outpatient.    Pneumothorax likely secondary to coughing from COPD exacerbation. - Continue oxygen supplementation for now due to pneumothorax.  Once confirmed stability can transition to goal of SpO2 88-92% - Instructed on incentive spirometer use ten times hourly while awake. - Repeat CT outpatient in 6 to 8 weeks.  Moderate OSA: - Not on CPAP.  Had appnt scheduled with Dr Neda. Will request for follow up.   Acute on chronic diastolic heart failure > Appears relatively euvolemic on examination. - Agree with maintenance diuretics.  Pulmonary will  sign off.  Please call with questions.  Patient Lines/Drains/Airways Status     Active Line/Drains/Airways     Name Placement date Placement time Site Days   Peripheral IV 08/28/24 20 G 1 Right Antecubital 08/28/24  1632  Antecubital  9   Peripheral IV 08/28/24 20 G Posterior;Right Hand 08/28/24  1653  Hand  9            Labs   CBC: Recent Labs  Lab 08/31/24 0942 09/02/24 0342 09/04/24 0414  WBC 11.1* 8.4 13.3*  HGB 16.3* 16.5* 16.1*  HCT 51.7* 51.5* 50.3*  MCV 104.0* 102.6* 101.6*  PLT 183 185 172    Basic Metabolic Panel: Recent Labs  Lab 08/31/24 0942 09/01/24 0823 09/02/24 0342 09/03/24 1253 09/04/24 0414  NA 142 142 140 140 137  K 4.2 4.2 4.1 3.9 4.4  CL 98 95* 94* 94* 95*  CO2 40* 36* 37* 39* 31  GLUCOSE 96 135* 143* 130* 137*  BUN 34* 33* 29* 27* 25*  CREATININE 0.62 0.71 0.69 0.99 0.73  CALCIUM 9.0 9.1 9.1 9.4 9.2  MG 2.6* 2.2 2.4 2.4 2.7*   GFR: Estimated Creatinine Clearance: 79.7 mL/min (by C-G formula based on SCr of 0.73 mg/dL). Recent Labs  Lab 08/31/24 [phone] 09/02/24 0342 09/04/24 0414  WBC 11.1* 8.4 13.3*    Liver Function Tests: No results for input(s): AST, ALT, ALKPHOS, BILITOT, PROT, ALBUMIN in the last 168 hours.  No results for input(s): LIPASE, AMYLASE in the last 168 hours. No results for input(s): AMMONIA in the last 168 hours.  ABG    Component Value Date/Time   HCO3 38.6 (H) 08/29/2024 0659   TCO2 39 (H) 08/28/2024 2026   O2SAT 79 08/29/2024 0659     Coagulation Profile: No results for input(s): INR, PROTIME in the last 168 hours.  Cardiac Enzymes: No results for input(s): CKTOTAL, CKMB, CKMBINDEX, TROPONINI in the last 168 hours.  HbA1C: Hgb A1c MFr Bld  Date/Time Value Ref Range Status  12/28/2023 03:35 PM 6.5 4.6 - 6.5 % Final    Comment:    Glycemic Control Guidelines for People with Diabetes:Non Diabetic:  <6%Goal of Therapy: <7%Additional Action Suggested:  >8%    03/10/2023 10:28 AM 6.4 4.6 - 6.5 % Final    Comment:    Glycemic Control Guidelines for People with Diabetes:Non Diabetic:  <6%Goal of Therapy: <7%Additional Action Suggested:  >8%     CBG: No results for input(s): GLUCAP in the last 168 hours.  Review of Systems:   Not obtained  Past Medical History:  She,  has a past medical history of Aortic atherosclerosis, Breast cancer (HCC) (09/22/2006), Centrilobular emphysema (HCC), Chronic combined systolic and diastolic heart failure (HCC), Edema, Leg swelling, OSA (obstructive sleep apnea), Panic disorder, SOB (shortness of breath), and Tobacco abuse.   Surgical History:   Past Surgical History:  Procedure Laterality Date   BREAST LUMPECTOMY  2008     Social History:   reports that she has been smoking cigarettes. She has a 67.5 pack-year smoking history. She has never used smokeless tobacco. She reports current alcohol use. She reports that she does not use drugs.   Family History:  Her family history includes Breast cancer in her paternal aunt and sister; Celiac disease in her paternal grandmother; Colon cancer in her paternal aunt; Diabetes in her mother and another family member; Heart disease in her brother, father, maternal grandfather, and paternal grandfather.   Allergies Allergies[1]   Home Medications  Prior to Admission medications  Medication Sig Start Date End Date Taking? Authorizing Provider  aspirin EC 81 MG tablet Take 81 mg by mouth daily. Swallow whole.   Yes [provider]  Calcium-Magnesium -Vitamin D  600-40-500 MG-MG-UNIT TB24 Take 1 tablet by mouth daily.   Yes [provider]  Cholecalciferol (VITAMIN D -3) 1000 UNITS CAPS Take 1 capsule by mouth daily.   Yes [provider]  diazepam  (VALIUM ) 5 MG tablet Take 1 tablet (5 mg total) by mouth daily as needed for anxiety. 12/31/20  Yes Rollene Almarie LABOR, MD  furosemide  (LASIX ) 40 MG tablet Take 1 tablet (40 mg total) by mouth  daily. 01/20/24  Yes Court Dorn PARAS, MD  Multiple Vitamins-Minerals (CENTRUM SILVER) tablet Take 1 tablet by mouth daily.   Yes [provider]  spironolactone  (ALDACTONE ) 25 MG tablet Take 1 tablet (25 mg total) by mouth daily. 03/03/24  Yes Goodrich, Callie E, PA-C  vitamin B-12 (CYANOCOBALAMIN ) 1000 MCG tablet Take 1,000 mcg by mouth daily.   Yes [provider]  vitamin C (ASCORBIC ACID) 500 MG tablet Take 500 mg by mouth daily.   Yes [provider]    Total care time 40 minutes   Care time was exclusive of separately billable procedures and treating other patients.  Care was  necessary to treat or prevent imminent or life-threatening deterioration.   Care was time spent personally by me on the following activities: development of treatment plan with patient and/or surrogate as well as nursing, discussions with consultants, evaluation of patient's response to treatment, examination of patient, obtaining history from patient or surrogate, ordering and performing treatments and interventions, ordering and review of laboratory studies, ordering and review of radiographic studies and pulse oximetry.   Sammi JONETTA Fredericks, MD Pulmonary, Critical Care and Sleep Attending.  Pager: [phone]  09/06/2024, 7:30 AM             [1]  Allergies Allergen Reactions   Amoxicillin     REACTION: rash   Penicillins     REACTION: rash

## 2024-09-06 NOTE — Progress Notes (Signed)
 Mobility Specialist - Progress Note Nurse requested Mobility Specialist to perform oxygen saturation test with pt which includes removing pt from oxygen both at rest and while ambulating.  Below are the results from that testing.     Patient Saturations on Room Air at Rest = spO2 82% Performed pursed lip breathing for 1 minute with sp02 at 88%.  Patient Saturations on 2 Liters of oxygen while Ambulating = sp02 90%  At end of testing pt left in room on 2  Liters of oxygen.  Reported results to nurse.     09/06/24 1518  Mobility  Activity Ambulated independently  Level of Assistance Modified independent, requires aide device or extra time  Assistive Device None  Distance Ambulated (ft) 200 ft  Range of Motion/Exercises Active  Activity Response Tolerated fair  Mobility visit 1 Mobility  Mobility Specialist Start Time (ACUTE ONLY) 1505  Mobility Specialist Stop Time (ACUTE ONLY) 1518  Mobility Specialist Time Calculation (min) (ACUTE ONLY) 13 min   Pt was found in bed and agreeable to mobilize. No complaints with session. At EOS returned to recliner chair with all needs met. Call bell in reach and husband in room.   Stacey [name],  Mobility Specialist Can be reached via Secure Chat

## 2024-09-07 ENCOUNTER — Other Ambulatory Visit (HOSPITAL_COMMUNITY): Payer: Self-pay

## 2024-09-07 MED ORDER — BREZTRI AEROSPHERE 160-9-4.8 MCG/ACT IN AERO
2.0000 | INHALATION_SPRAY | Freq: Two times a day (BID) | RESPIRATORY_TRACT | 2 refills | Status: DC
  Filled 2024-09-07 (×2): qty 10.7, 30d supply, fill #0

## 2024-09-07 MED ORDER — ALBUTEROL SULFATE HFA 108 (90 BASE) MCG/ACT IN AERS
2.0000 | INHALATION_SPRAY | Freq: Four times a day (QID) | RESPIRATORY_TRACT | 2 refills | Status: AC | PRN
  Filled 2024-09-07: qty 6.7, 25d supply, fill #0

## 2024-09-07 NOTE — Progress Notes (Signed)
SATURATION QUALIFICATIONS: (This note is used to comply with regulatory documentation for home oxygen)  Patient Saturations on Room Air at Rest = 88%  Patient Saturations on Room Air while Ambulating = 82%  Patient Saturations on 2 Liters of oxygen while Ambulating = 90%  Please briefly explain why patient needs home oxygen:

## 2024-09-07 NOTE — TOC Transition Note (Signed)
 Transition of Care [facility]) - Discharge Note   Patient Details  Name: Stacey [name] MRN: [id number] Date of Birth: [birth year]  [facility] CM/SW Contact:  Tawni CHRISTELLA Eva, LCSW Phone Number: 09/07/2024, 10:15 AM   Clinical Narrative:     CSW received a consult for a benefits check on Breztri  and Trelegy and requested assistance from CMA. The pts copay for a 30-day supply of each medication is $47. CSW later confirmed with [facility] that this copay applies only after the pt meets her deductible.  CSW spoke with the pt to discuss recommendations for home oxygen. The pt is agreeable and has no preference for a DME company. A referral was sent to [facility] for oxygen to be delivered to the pts room prior to discharge. The pt reports that her spouse will provide transportation home upon discharge. No further ICM needs , ICM sign off.    Final next level of care: Home/Self Care Barriers to Discharge: Barriers Resolved   Patient Goals and CMS Choice Patient states their goals for this hospitalization and ongoing recovery are:: retrun home with self CMS Medicare.gov Compare Post Acute Care list provided to:: Patient Choice offered to / list presented to : Patient [place] ownership interest in [facility].provided to:: Patient    Discharge Placement                    Patient and family notified of of transfer: 09/07/24  Discharge Plan and Services Additional resources added to the After Visit Summary for   In-house Referral: NA Discharge Planning Services: CM Consult Post Acute Care Choice: Durable Medical Equipment          DME Arranged: Oxygen DME Agency: [employer] Date DME Agency Contacted: 09/07/24 Time DME Agency Contacted: [phone] Representative spoke with at DME Agency: jermaine            Social Drivers of Health (SDOH) Interventions SDOH Screenings   Food Insecurity: No Food Insecurity (08/29/2024)  Housing: Unknown  (08/29/2024)  Transportation Needs: No Transportation Needs (08/29/2024)  Utilities: Not At Risk (08/29/2024)  Alcohol Screen: Low Risk (03/17/2024)  Depression (PHQ2-9): Low Risk (03/29/2024)  Financial Resource Strain: Low Risk (03/17/2024)  Physical Activity: Inactive (03/17/2024)  Social Connections: Moderately Isolated (08/29/2024)  Stress: Stress Concern Present (03/17/2024)  Tobacco Use: High Risk (08/29/2024)  Health Literacy: Adequate Health Literacy (03/17/2024)     Readmission Risk Interventions    08/31/2024    4:01 PM  Readmission Risk Prevention Plan  Post Dischage Appt Complete  Medication Screening Complete  Transportation Screening Complete

## 2024-09-07 NOTE — Progress Notes (Signed)
 Discharge medications delivered to patient at the bedside.

## 2024-09-07 NOTE — Discharge Summary (Signed)
 Physician Discharge Summary  Stacey [name] FMW:[id number] DOB: 02-18-1956 DOA: 08/28/2024  PCP: Rollene Almarie LABOR, MD  Admit date: 08/28/2024 Discharge date: 09/07/2024  Admitted From: Home Disposition:  Home  Recommendations for Outpatient Follow-up:  Follow up with pulmonary in 1-2 weeks Please obtain BMP/CBC in one week   Home Health:No Equipment/Devices:None  Discharge Condition:Stable CODE STATUS:Full Diet recommendation: Heart Healthy  Brief/Interim Summary: 68 y.o. female past medical history HFpEF, active smoker obesity breast cancer comes in complaining of cough and shortness of breath found to be in acute respiratory failure with hypoxia due to COPD exacerbation. CT angio on 09/03/2024 showed no evidence of PE, small pneumothorax 5 to 10% and small bronchial wall thickening.   Discharge Diagnoses:  Principal Problem:   Acute hypoxic on chronic hypercapnic respiratory failure (HCC) Active Problems:   Tobacco abuse   SOB (shortness of breath)   Acute on chronic combined systolic and diastolic CHF (congestive heart failure) (HCC)   COPD with acute exacerbation (HCC)   Lactic acidosis  Acute respiratory failure with hypoxia and hypercarbia secondary to acute bronchitis and small pneumothorax more She was treated with IV steroids inhalers and antibiotics. She completed her course of antibiotics and steroids in house. She has a small pneumothorax CT angio was done and showed no PE but did confirm pneumothorax. She was treated conservatively. Repeated CT chest showed resolved pneumothorax no pleural effusion and no pleural effusion. She will go home on inhalers and follow-up with pulmonary as an outpatient. She also go home on oxygen.  Moderate obstructive sleep apnea/possibly obesity hypoventilation syndrome: Follow-up with pulmonary as an outpatient will need a sleep study as an outpatient.  Acute on chronic HFpEF: 2D echo showed preserved EF she was started on  IV Lasix  diuresed well she will go home on oral Lasix .  Active tobacco: Continue nicotine  patch.  Macrocytosis/polycythemia: Likely related to hypoxia show go home on oxygen.  Discharge Instructions  Discharge Instructions     For home use only DME oxygen   Complete by: As directed    Length of Need: 6 Months   Mode or (Route): Nasal cannula   Liters per Minute: 2   Frequency: Continuous (stationary and portable oxygen unit needed)   Oxygen delivery system: Gas   Increase activity slowly   Complete by: As directed       Allergies as of 09/07/2024       Reactions   Amoxicillin    REACTION: rash   Penicillins    REACTION: rash        Medication List     TAKE these medications    albuterol  108 (90 Base) MCG/ACT inhaler Commonly known as: VENTOLIN  HFA Inhale 2 puffs into the lungs every 6 (six) hours as needed for wheezing or shortness of breath.   ascorbic acid 500 MG tablet Commonly known as: VITAMIN C Take 500 mg by mouth daily.   aspirin EC 81 MG tablet Take 81 mg by mouth daily. Swallow whole.   Breztri  Aerosphere 160-9-4.8 MCG/ACT Aero inhaler Generic drug: budesonide -glycopyrrolate -formoterol  Inhale 2 puffs into the lungs in the morning and at bedtime.   Calcium-Magnesium -Vitamin D  600-40-500 MG-MG-UNIT Tb24 Take 1 tablet by mouth daily.   Centrum Silver tablet Take 1 tablet by mouth daily.   cyanocobalamin  1000 MCG tablet Commonly known as: VITAMIN B12 Take 1,000 mcg by mouth daily.   diazepam  5 MG tablet Commonly known as: VALIUM  Take 1 tablet (5 mg total) by mouth daily as needed for anxiety.  furosemide  40 MG tablet Commonly known as: Lasix  Take 1 tablet (40 mg total) by mouth daily.   spironolactone  25 MG tablet Commonly known as: ALDACTONE  Take 1 tablet (25 mg total) by mouth daily.   Vitamin D -3 25 MCG (1000 UT) Caps Take 1 capsule by mouth daily.               Durable Medical Equipment  (From admission, onward)            Start     Ordered   09/07/24 0000  For home use only DME oxygen       Question Answer Comment  Length of Need 6 Months   Mode or (Route) Nasal cannula   Liters per Minute 2   Frequency Continuous (stationary and portable oxygen unit needed)   Oxygen delivery system: Gas      09/07/24 0928   09/06/24 0858  For home use only DME oxygen  Once       Question Answer Comment  Length of Need 6 Months   Mode or (Route) Nasal cannula   Liters per Minute 3   Oxygen delivery system: Gas      09/06/24 0857   09/02/24 1300  For home use only DME oxygen  Once       Question Answer Comment  Length of Need Lifetime   Mode or (Route) Nasal cannula   Liters per Minute 4   Frequency Continuous (stationary and portable oxygen unit needed)   Oxygen delivery system: Gas      09/02/24 1259            Allergies[1]  Consultations: Pulmonary   Procedures/Studies: CT CHEST WO CONTRAST Result Date: 09/06/2024 CLINICAL DATA:  Left-sided pneumothorax. EXAM: CT CHEST WITHOUT CONTRAST TECHNIQUE: Multidetector CT imaging of the chest was performed following the standard protocol without IV contrast. RADIATION DOSE REDUCTION: This exam was performed according to the departmental dose-optimization program which includes automated exposure control, adjustment of the mA and/or kV according to patient size and/or use of iterative reconstruction technique. COMPARISON:  11/05/2023 and 12/29/2023 FINDINGS: Cardiovascular: Heart is normal size. Calcified plaque over the left anterior descending coronary artery. Thoracic aorta is normal in caliber. There is calcified plaque over the descending thoracic aorta. Pulmonary arterial system is unremarkable on this noncontrast exam. Remaining vascular structures are unremarkable. Mediastinum/Nodes: No mediastinal or hilar adenopathy. Remaining mediastinal structures are normal. Lungs/Pleura: Lungs are adequately inflated. Atelectatic change over the  anteromedial right middle lobe as well as linear atelectasis right lower lobe. Previously seen left-sided pneumothorax is not visualized. Atelectatic change over the lower lobe and lingula. Mild posterior dependent atelectasis over the posterior left upper lobe. No effusion. Airways are unremarkable. Upper Abdomen: Minimal calcified plaque over the visualized upper abdominal aorta. Few small liver hypodensities unchanged and likely cysts, but too small to characterize stable partially visualized cyst over the upper pole left kidney. Musculoskeletal: No focal abnormality. IMPRESSION: 1. No acute cardiopulmonary disease. Previously seen left-sided pneumothorax is not visualized. 2. Atelectatic change over the right middle lobe, right lower lobe, left lower lobe and lingula. 3. Aortic atherosclerosis. Atherosclerotic coronary artery disease. 4. Few small liver hypodensities unchanged and likely cysts, but too small to characterize. Stable partially visualized cyst over the upper pole left kidney. Aortic Atherosclerosis (ICD10-I70.0). Electronically Signed   By: Toribio Agreste M.D.   On: 09/06/2024 08:10   DG CHEST PORT 1 VIEW Result Date: 09/05/2024 EXAM: 1 AP VIEW XRAY OF THE CHEST 09/05/2024 05:28:00  AM COMPARISON: 09/04/2024 CLINICAL HISTORY: Pneumothorax FINDINGS: LUNGS AND PLEURA: Increased moderate left pleural effusion. Trace left apical pneumothorax, similar. Mildly increased interstitial markings. Increased left basilar homogeneous opacities. HEART AND MEDIASTINUM: Surgical clips over left hilum. Atherosclerotic calcifications. No acute abnormality of the cardiac and mediastinal silhouettes. BONES AND SOFT TISSUES: No acute osseous abnormality. IMPRESSION: 1. Trace left apical pneumothorax, similar to prior study. 2. Increased moderate left pleural effusion. 3. Increased left basilar homogeneous opacities. Electronically signed by: Waddell Calk MD 09/05/2024 06:53 AM EST RP Workstation: [id number]   DG  CHEST PORT 1 VIEW Result Date: 09/04/2024 CLINICAL DATA:  [id number] Pneumothorax [id number] EXAM: PORTABLE CHEST 1 VIEW COMPARISON:  September 04, 2024, September 03, 2024 FINDINGS: The cardiomediastinal silhouette is unchanged in contour.Sclerotic calcifications. Small LEFT pleural effusion. Favored trace LEFT apical pneumothorax. Bibasilar linear opacities, likely atelectasis. IMPRESSION: 1. Favored trace LEFT apical pneumothorax. 2. Small LEFT pleural effusion. Electronically Signed   By: Corean Salter M.D.   On: 09/04/2024 15:59   DG CHEST PORT 1 VIEW Result Date: 09/04/2024 CLINICAL DATA:  Pneumothorax ex. EXAM: PORTABLE CHEST 1 VIEW COMPARISON:  08/31/2024 FINDINGS: Findings suspicious for persistent left apical pneumothorax since CT yesterday despite lung marking seen cranial to the apparent pleural line. Left base collapse/consolidation again noted. The cardio pericardial silhouette is enlarged. No acute bony abnormality. Telemetry leads overlie the chest. IMPRESSION: 1. Findings suspicious for persistent left apical pneumothorax despite lung marking seen cranial to the apparent pleural line. 2. Persistent left base collapse/consolidation. Electronically Signed   By: Camellia Candle M.D.   On: 09/04/2024 07:57   CT Angio Chest Pulmonary Embolism (PE) W or WO Contrast Addendum Date: 09/03/2024 ADDENDUM REPORT: 09/03/2024 15:57 ADDENDUM: Critical Value/emergent results were called by telephone at the time of interpretation on 09/03/2024 at 3:51 pm to provider [facility] PATEL , who verbally acknowledged these results. Electronically Signed   By: Leita Waddell M.D.   On: 09/03/2024 15:57   Result Date: 09/03/2024 CLINICAL DATA:  Pulmonary embolism suspected, high probability. EXAM: CT ANGIOGRAPHY CHEST WITH CONTRAST TECHNIQUE: Multidetector CT imaging of the chest was performed using the standard protocol during bolus administration of intravenous contrast. Multiplanar CT image reconstructions and MIPs were  obtained to evaluate the vascular anatomy. RADIATION DOSE REDUCTION: This exam was performed according to the departmental dose-optimization program which includes automated exposure control, adjustment of the mA and/or kV according to patient size and/or use of iterative reconstruction technique. CONTRAST:  75mL OMNIPAQUE  IOHEXOL  350 MG/ML SOLN COMPARISON:  02/04/2024. FINDINGS: Cardiovascular: The heart is enlarged and there is no pericardial effusion. Scattered coronary artery calcifications are noted. There is atherosclerotic calcification of the aorta without evidence of aneurysm. The pulmonary trunk is normal in caliber. No evidence of pulmonary embolism is seen. Mediastinum/Nodes: No enlarged mediastinal, hilar, or axillary lymph nodes. Thyroid  gland, trachea, and esophagus demonstrate no significant findings. A small amount air is noted in the epicardial fat. Lungs/Pleura: Small pneumothorax is present on the left of approximately 5-10%. Atelectasis is noted bilaterally. Centrilobular emphysema is noted. Peribronchial cuffing is present bilaterally, which may be infectious or inflammatory. Upper Abdomen: A few scattered hypodensities are present in the liver which are too small to further characterize. There is a cyst in the left kidney. No acute abnormality is seen. Musculoskeletal: Degenerative changes are present in the thoracic spine. No acute osseous abnormality is seen. Review of the MIP images confirms the above findings. IMPRESSION: 1. No evidence of pulmonary embolism. 2. Small left pneumothorax of approximally 5-10%.  3. Bronchial wall thickening bilaterally, which may be infectious or inflammatory. 4. Atelectasis at the lung bases bilaterally. 5. Coronary artery calcifications and aortic atherosclerosis. Electronically Signed: By: Leita Birmingham M.D. On: 09/03/2024 15:45   DG CHEST PORT 1 VIEW Result Date: 08/31/2024 CLINICAL DATA:  Shortness of breath EXAM: PORTABLE CHEST 1 VIEW COMPARISON:   08/28/2024 FINDINGS: Normal-sized heart. Tortuous and partially calcified thoracic aorta. Minimal bibasilar atelectasis. Mild peribronchial thickening. Unremarkable bones. IMPRESSION: 1. Minimal bibasilar atelectasis. 2. Mild bronchitic changes. 3. Resolved changes of CHF. Electronically Signed   By: Elspeth Bathe M.D.   On: 08/31/2024 13:30   ECHOCARDIOGRAM COMPLETE Result Date: 08/29/2024    ECHOCARDIOGRAM REPORT   Patient Name:   Stacey [name] Date of Exam: 08/29/2024 Medical Rec #:  [id number]      Height:       68.0 in Accession #:    [id number]     Weight:       209.4 lb Date of Birth:  01/23/1956      BSA:          2.084 m Patient Age:    68 years       BP:           120/70 mmHg Patient Gender: F              HR:           84 bpm. Exam Location:  Inpatient Procedure: 2D Echo (Both Spectral and Color Flow Doppler were utilized during            procedure). Indications:    CHF  History:        Patient has prior history of Echocardiogram examinations. CHF.  Sonographer:    Norleen Amour Referring Phys: [id number] JUSTIN B HOWERTER IMPRESSIONS  1. Left ventricular ejection fraction, by estimation, is 60 to 65%. Left ventricular ejection fraction by 2D MOD biplane is 65.5 %. The left ventricle has normal function. The left ventricle has no regional wall motion abnormalities. Left ventricular diastolic parameters are consistent with Grade I diastolic dysfunction (impaired relaxation).  2. Right ventricular systolic function is normal. The right ventricular size is normal. Tricuspid regurgitation signal is inadequate for assessing PA pressure.  3. The mitral valve is grossly normal. Trivial mitral valve regurgitation.  4. The aortic valve was not well visualized. Aortic valve regurgitation is not visualized. Aortic valve sclerosis is present, with no evidence of aortic valve stenosis.  5. The inferior vena cava is normal in size with <50% respiratory variability, suggesting right atrial pressure of 8 mmHg.  Comparison(s): Changes from prior study are noted. 02/22/2024: LVEF 45-50%. FINDINGS  Left Ventricle: Left ventricular ejection fraction, by estimation, is 60 to 65%. Left ventricular ejection fraction by 2D MOD biplane is 65.5 %. The left ventricle has normal function. The left ventricle has no regional wall motion abnormalities. The left ventricular internal cavity size was normal in size. There is no left ventricular hypertrophy. Left ventricular diastolic parameters are consistent with Grade I diastolic dysfunction (impaired relaxation). Indeterminate filling pressures. Right Ventricle: The right ventricular size is normal. No increase in right ventricular wall thickness. Right ventricular systolic function is normal. Tricuspid regurgitation signal is inadequate for assessing PA pressure. Left Atrium: Left atrial size was normal in size. Right Atrium: Right atrial size was normal in size. Pericardium: There is no evidence of pericardial effusion. Mitral Valve: The mitral valve is grossly normal. Trivial mitral valve regurgitation. MV peak gradient, 2.7 mmHg. The mean  mitral valve gradient is 1.0 mmHg. Tricuspid Valve: The tricuspid valve is not well visualized. Tricuspid valve regurgitation is not demonstrated. Aortic Valve: The aortic valve was not well visualized. Aortic valve regurgitation is not visualized. Aortic valve sclerosis is present, with no evidence of aortic valve stenosis. Aortic valve mean gradient measures 5.0 mmHg. Aortic valve peak gradient measures 10.4 mmHg. Aortic valve area, by VTI measures 1.87 cm. Pulmonic Valve: The pulmonic valve was not well visualized. Pulmonic valve regurgitation is not visualized. Aorta: The aortic root and ascending aorta are structurally normal, with no evidence of dilitation. Venous: The inferior vena cava is normal in size with less than 50% respiratory variability, suggesting right atrial pressure of 8 mmHg. IAS/Shunts: No atrial level shunt detected by color  flow Doppler.  LEFT VENTRICLE PLAX 2D                        Biplane EF (MOD) LVIDd:         5.20 cm         LV Biplane EF:   Left LVIDs:         2.90 cm                          ventricular LV PW:         0.80 cm                          ejection LV IVS:        0.90 cm                          fraction by LVOT diam:     1.90 cm                          2D MOD LV SV:         51                               biplane is LV SV Index:   24                               65.5 %. LVOT Area:     2.84 cm                                Diastology                                LV e' medial:    6.31 cm/s LV Volumes (MOD)               LV E/e' medial:  13.6 LV vol d, MOD    95.9 ml       LV e' lateral:   8.16 cm/s A2C:                           LV E/e' lateral: 10.5 LV vol d, MOD    78.8 ml A4C: LV vol s, MOD    31.3 ml A2C: LV vol s, MOD    30.2  ml A4C: LV SV MOD A2C:   64.6 ml LV SV MOD A4C:   78.8 ml LV SV MOD BP:    59.8 ml RIGHT VENTRICLE             IVC RV Basal diam:  3.40 cm     IVC diam: 1.80 cm RV S prime:     11.90 cm/s TAPSE (M-mode): 2.4 cm      PULMONARY VEINS                             Diastolic Velocity: 39.80 cm/s                             S/D Velocity:       1.10                             Systolic Velocity:  42.80 cm/s LEFT ATRIUM             Index        RIGHT ATRIUM           Index LA diam:        3.10 cm 1.49 cm/m   RA Area:     15.50 cm LA Vol (A2C):   62.2 ml 29.84 ml/m  RA Volume:   41.70 ml  20.01 ml/m LA Vol (A4C):   32.0 ml 15.35 ml/m LA Biplane Vol: 47.1 ml 22.60 ml/m  AORTIC VALVE                     PULMONIC VALVE AV Area (Vmax):    1.55 cm      PV Vmax:       0.74 m/s AV Area (Vmean):   1.73 cm      PV Peak grad:  2.2 mmHg AV Area (VTI):     1.87 cm AV Vmax:           161.00 cm/s AV Vmean:          104.000 cm/s AV VTI:            0.271 m AV Peak Grad:      10.4 mmHg AV Mean Grad:      5.0 mmHg LVOT Vmax:         88.10 cm/s LVOT Vmean:        63.400 cm/s LVOT VTI:          0.179 m  LVOT/AV VTI ratio: 0.66  AORTA Ao Root diam: 2.50 cm Ao Asc diam:  2.60 cm MITRAL VALVE MV Area (PHT): 4.01 cm    SHUNTS MV Area VTI:   2.38 cm    Systemic VTI:  0.18 m MV Peak grad:  2.7 mmHg    Systemic Diam: 1.90 cm MV Mean grad:  1.0 mmHg MV Vmax:       0.82 m/s MV Vmean:      56.8 cm/s MV Decel Time: 189 msec MV E velocity: 85.70 cm/s MV A velocity: 95.50 cm/s MV E/A ratio:  0.90 Vinie Maxcy MD Electronically signed by Vinie Maxcy MD Signature Date/Time: 08/29/2024/11:10:55 AM    Final    DG Chest Portable 1 View Result Date: 08/28/2024 CLINICAL DATA:  Shortness of breath, cough, and congestion. EXAM: PORTABLE CHEST 1 VIEW COMPARISON:  12/05/2023. FINDINGS: Heart is normal in size and the mediastinal  contour is within normal limits. Atherosclerotic calcification of the aorta is noted. The pulmonary vasculature is distended. Interstitial prominence is noted bilaterally. No consolidation or pneumothorax is seen. There are suspected small bilateral pleural effusions. No acute osseous abnormality. IMPRESSION: 1. Distended pulmonary vasculature with interstitial prominence bilaterally, possible edema versus infection. 2. Suspected small bilateral pleural effusions. Electronically Signed   By: Leita Birmingham M.D.   On: 08/28/2024 16:55   (Echo, Carotid, EGD, Colonoscopy, ERCP)    Subjective: No complaints  Discharge Exam: Vitals:   09/06/24 2003 09/07/24 0441  BP:  (!) 105/59  Pulse:  72  Resp:    Temp:  98.4 F (36.9 C)  SpO2: 93% 93%   Vitals:   09/06/24 1959 09/06/24 2003 09/07/24 0441 09/07/24 0500  BP:   (!) 105/59   Pulse:   72   Resp:      Temp:   98.4 F (36.9 C)   TempSrc:   Oral   SpO2: 93% 93% 93%   Weight:    98.5 kg  Height:        General: Pt is alert, awake, not in acute distress Cardiovascular: RRR, S1/S2 +, no rubs, no gallops Respiratory: CTA bilaterally, no wheezing, no rhonchi Abdominal: Soft, NT, ND, bowel sounds + Extremities: no edema, no  cyanosis    The results of significant diagnostics from this hospitalization (including imaging, microbiology, ancillary and laboratory) are listed below for reference.     Microbiology: Recent Results (from the past 240 hours)  Culture, blood (routine x 2)     Status: None   Collection Time: 08/28/24  4:35 PM   Specimen: BLOOD  Result Value Ref Range Status   Specimen Description   Final    BLOOD RIGHT ANTECUBITAL Performed at [facility], [address] [place], [place] [address]    Special Requests   Final    BOTTLES DRAWN AEROBIC AND ANAEROBIC Blood Culture results may not be optimal due to an inadequate volume of blood received in culture bottles Performed at [facility], [address] [place], [place] [address]    Culture   Final    NO GROWTH 5 DAYS Performed at [facility], [address] [place], [place] [address]    Report Status 09/02/2024 FINAL  Final  Resp panel by RT-PCR (RSV, Flu A&B, Covid) Anterior Nasal Swab     Status: None   Collection Time: 08/28/24  4:35 PM   Specimen: Anterior Nasal Swab  Result Value Ref Range Status   SARS Coronavirus 2 by RT PCR NEGATIVE NEGATIVE Final    Comment: (NOTE) SARS-CoV-2 target nucleic acids are NOT DETECTED.  The SARS-CoV-2 RNA is generally detectable in upper respiratory specimens during the acute phase of infection. The lowest concentration of SARS-CoV-2 viral copies this assay can detect is 138 copies/mL. A negative result does not preclude SARS-Cov-2 infection and should not be used as the sole basis for treatment or other patient management decisions. A negative result may occur with  improper specimen collection/handling, submission of specimen other than nasopharyngeal swab, presence of viral mutation(s) within the areas targeted by this assay, and inadequate number of viral copies(<138 copies/mL). A negative result must be combined with clinical observations, patient history,  and epidemiological information. The expected result is Negative.  Fact Sheet for Patients:  bloggercourse.com  Fact Sheet for Healthcare Providers:  seriousbroker.it  This test is no t yet approved or cleared by the [place]   FDA and  has been authorized for detection and/or diagnosis of SARS-CoV-2 by FDA under an Emergency Use Authorization (EUA). This EUA will remain  in effect (meaning this test can be used) for the duration of the COVID-19 declaration under Section 564(b)(1) of the Act, 21 U.S.C.section 360bbb-3(b)(1), unless the authorization is terminated  or revoked sooner.       Influenza A by PCR NEGATIVE NEGATIVE Final   Influenza B by PCR NEGATIVE NEGATIVE Final    Comment: (NOTE) The Xpert Xpress SARS-CoV-2/FLU/RSV plus assay is intended as an aid in the diagnosis of influenza from Nasopharyngeal swab specimens and should not be used as a sole basis for treatment. Nasal washings and aspirates are unacceptable for Xpert Xpress SARS-CoV-2/FLU/RSV testing.  Fact Sheet for Patients: bloggercourse.com  Fact Sheet for Healthcare Providers: seriousbroker.it  This test is not yet approved or cleared by the [place]  FDA and has been authorized for detection and/or diagnosis of SARS-CoV-2 by FDA under an Emergency Use Authorization (EUA). This EUA will remain in effect (meaning this test can be used) for the duration of the COVID-19 declaration under Section 564(b)(1) of the Act, 21 U.S.C. section 360bbb-3(b)(1), unless the authorization is terminated or revoked.     Resp Syncytial Virus by PCR NEGATIVE NEGATIVE Final    Comment: (NOTE) Fact Sheet for Patients: bloggercourse.com  Fact Sheet for Healthcare Providers: seriousbroker.it  This test is not yet approved or cleared by the [place]  FDA and has  been authorized for detection and/or diagnosis of SARS-CoV-2 by FDA under an Emergency Use Authorization (EUA). This EUA will remain in effect (meaning this test can be used) for the duration of the COVID-19 declaration under Section 564(b)(1) of the Act, 21 U.S.C. section 360bbb-3(b)(1), unless the authorization is terminated or revoked.  Performed at [facility], [address] [place], [place] [address]   Culture, blood (routine x 2)     Status: None   Collection Time: 08/28/24  4:40 PM   Specimen: BLOOD RIGHT HAND  Result Value Ref Range Status   Specimen Description   Final    BLOOD RIGHT HAND Performed at [facility], [address] [place], [place] [address]    Special Requests   Final    BOTTLES DRAWN AEROBIC AND ANAEROBIC Blood Culture results may not be optimal due to an inadequate volume of blood received in culture bottles Performed at [facility], [address] [place], [place] [address]    Culture   Final    NO GROWTH 5 DAYS Performed at [facility], [address] [place], [place] [address]    Report Status 09/02/2024 FINAL  Final  MRSA Next Gen by PCR, Nasal     Status: None   Collection Time: 08/29/24 12:26 AM   Specimen: Nasal Mucosa; Nasal Swab  Result Value Ref Range Status   MRSA by PCR Next Gen NOT DETECTED NOT DETECTED Final    Comment: (NOTE) The GeneXpert MRSA Assay (FDA approved for NASAL specimens only), is one component of a comprehensive MRSA colonization surveillance program. It is not intended to diagnose MRSA infection nor to guide or monitor treatment for MRSA infections. Test performance is not FDA approved in patients less than 63 years old. Performed at [facility], [address], [place], [place] [address]      Labs: BNP (last 3 results) No results for input(s): BNP in the last 8760 hours. Basic Metabolic  Panel: Recent Labs  Lab 08/31/24 0942 09/01/24 0823  09/02/24 0342 09/03/24 1253 09/04/24 0414  NA 142 142 140 140 137  K 4.2 4.2 4.1 3.9 4.4  CL 98 95* 94* 94* 95*  CO2 40* 36* 37* 39* 31  GLUCOSE 96 135* 143* 130* 137*  BUN 34* 33* 29* 27* 25*  CREATININE 0.62 0.71 0.69 0.99 0.73  CALCIUM 9.0 9.1 9.1 9.4 9.2  MG 2.6* 2.2 2.4 2.4 2.7*   Liver Function Tests: No results for input(s): AST, ALT, ALKPHOS, BILITOT, PROT, ALBUMIN in the last 168 hours. No results for input(s): LIPASE, AMYLASE in the last 168 hours. No results for input(s): AMMONIA in the last 168 hours. CBC: Recent Labs  Lab 08/31/24 0942 09/02/24 0342 09/04/24 0414  WBC 11.1* 8.4 13.3*  HGB 16.3* 16.5* 16.1*  HCT 51.7* 51.5* 50.3*  MCV 104.0* 102.6* 101.6*  PLT 183 185 172   Cardiac Enzymes: No results for input(s): CKTOTAL, CKMB, CKMBINDEX, TROPONINI in the last 168 hours. BNP: Invalid input(s): POCBNP CBG: No results for input(s): GLUCAP in the last 168 hours. D-Dimer No results for input(s): DDIMER in the last 72 hours. Hgb A1c No results for input(s): HGBA1C in the last 72 hours. Lipid Profile No results for input(s): CHOL, HDL, LDLCALC, TRIG, CHOLHDL, LDLDIRECT in the last 72 hours. Thyroid  function studies No results for input(s): TSH, T4TOTAL, T3FREE, THYROIDAB in the last 72 hours.  Invalid input(s): FREET3 Anemia work up No results for input(s): VITAMINB12, FOLATE, FERRITIN, TIBC, IRON, RETICCTPCT in the last 72 hours. Urinalysis    Component Value Date/Time   COLORURINE STRAW (A) 08/28/2024 2032   APPEARANCEUR CLEAR 08/28/2024 2032   LABSPEC 1.015 08/28/2024 2032   PHURINE 5.5 08/28/2024 2032   GLUCOSEU NEGATIVE 08/28/2024 2032   GLUCOSEU NEGATIVE 03/14/2013 0907   HGBUR SMALL (A) 08/28/2024 2032   BILIRUBINUR NEGATIVE 08/28/2024 2032   KETONESUR NEGATIVE 08/28/2024 2032   PROTEINUR >=300 (A) 08/28/2024 2032   UROBILINOGEN 0.2 03/14/2013 0907   NITRITE NEGATIVE  08/28/2024 2032   LEUKOCYTESUR NEGATIVE 08/28/2024 2032   Sepsis Labs Recent Labs  Lab 08/31/24 0942 09/02/24 0342 09/04/24 0414  WBC 11.1* 8.4 13.3*   Microbiology Recent Results (from the past 240 hours)  Culture, blood (routine x 2)     Status: None   Collection Time: 08/28/24  4:35 PM   Specimen: BLOOD  Result Value Ref Range Status   Specimen Description   Final    BLOOD RIGHT ANTECUBITAL Performed at [facility], [address] [place], [place] [address]    Special Requests   Final    BOTTLES DRAWN AEROBIC AND ANAEROBIC Blood Culture results may not be optimal due to an inadequate volume of blood received in culture bottles Performed at [facility], [address] [place], [place] [address]    Culture   Final    NO GROWTH 5 DAYS Performed at [facility], [address] [place], [place] [address]    Report Status 09/02/2024 FINAL  Final  Resp panel by RT-PCR (RSV, Flu A&B, Covid) Anterior Nasal Swab     Status: None   Collection Time: 08/28/24  4:35 PM   Specimen: Anterior Nasal Swab  Result Value Ref Range Status   SARS Coronavirus 2 by RT PCR NEGATIVE NEGATIVE Final    Comment: (NOTE) SARS-CoV-2 target nucleic acids are NOT DETECTED.  The SARS-CoV-2 RNA is generally detectable in upper respiratory specimens during the acute  phase of infection. The lowest concentration of SARS-CoV-2 viral copies this assay can detect is 138 copies/mL. A negative result does not preclude SARS-Cov-2 infection and should not be used as the sole basis for treatment or other patient management decisions. A negative result may occur with  improper specimen collection/handling, submission of specimen other than nasopharyngeal swab, presence of viral mutation(s) within the areas targeted by this assay, and inadequate number of viral copies(<138 copies/mL). A negative result must be combined with clinical observations, patient history, and  epidemiological information. The expected result is Negative.  Fact Sheet for Patients:  bloggercourse.com  Fact Sheet for Healthcare Providers:  seriousbroker.it  This test is no t yet approved or cleared by the [place]  FDA and  has been authorized for detection and/or diagnosis of SARS-CoV-2 by FDA under an Emergency Use Authorization (EUA). This EUA will remain  in effect (meaning this test can be used) for the duration of the COVID-19 declaration under Section 564(b)(1) of the Act, 21 U.S.C.section 360bbb-3(b)(1), unless the authorization is terminated  or revoked sooner.       Influenza A by PCR NEGATIVE NEGATIVE Final   Influenza B by PCR NEGATIVE NEGATIVE Final    Comment: (NOTE) The Xpert Xpress SARS-CoV-2/FLU/RSV plus assay is intended as an aid in the diagnosis of influenza from Nasopharyngeal swab specimens and should not be used as a sole basis for treatment. Nasal washings and aspirates are unacceptable for Xpert Xpress SARS-CoV-2/FLU/RSV testing.  Fact Sheet for Patients: bloggercourse.com  Fact Sheet for Healthcare Providers: seriousbroker.it  This test is not yet approved or cleared by the [place]  FDA and has been authorized for detection and/or diagnosis of SARS-CoV-2 by FDA under an Emergency Use Authorization (EUA). This EUA will remain in effect (meaning this test can be used) for the duration of the COVID-19 declaration under Section 564(b)(1) of the Act, 21 U.S.C. section 360bbb-3(b)(1), unless the authorization is terminated or revoked.     Resp Syncytial Virus by PCR NEGATIVE NEGATIVE Final    Comment: (NOTE) Fact Sheet for Patients: bloggercourse.com  Fact Sheet for Healthcare Providers: seriousbroker.it  This test is not yet approved or cleared by the [place]  FDA and has been  authorized for detection and/or diagnosis of SARS-CoV-2 by FDA under an Emergency Use Authorization (EUA). This EUA will remain in effect (meaning this test can be used) for the duration of the COVID-19 declaration under Section 564(b)(1) of the Act, 21 U.S.C. section 360bbb-3(b)(1), unless the authorization is terminated or revoked.  Performed at [facility], [address] [place], [place] [address]   Culture, blood (routine x 2)     Status: None   Collection Time: 08/28/24  4:40 PM   Specimen: BLOOD RIGHT HAND  Result Value Ref Range Status   Specimen Description   Final    BLOOD RIGHT HAND Performed at [facility], [address] [place], [place] [address]    Special Requests   Final    BOTTLES DRAWN AEROBIC AND ANAEROBIC Blood Culture results may not be optimal due to an inadequate volume of blood received in culture bottles Performed at [facility], [address] [place], [place] [address]    Culture   Final    NO GROWTH 5 DAYS Performed at [facility], [address] [place], [place] [address]    Report Status 09/02/2024 FINAL  Final  MRSA Next Gen by PCR, Nasal  Status: None   Collection Time: 08/29/24 12:26 AM   Specimen: Nasal Mucosa; Nasal Swab  Result Value Ref Range Status   MRSA by PCR Next Gen NOT DETECTED NOT DETECTED Final    Comment: (NOTE) The GeneXpert MRSA Assay (FDA approved for NASAL specimens only), is one component of a comprehensive MRSA colonization surveillance program. It is not intended to diagnose MRSA infection nor to guide or monitor treatment for MRSA infections. Test performance is not FDA approved in patients less than 75 years old. Performed at [facility], [address] [place], [place] [address]      Time coordinating discharge: Over 35 minutes  SIGNED:   Erle Odell Castor, MD  [facility] 09/07/2024, 9:28 AM Pager   If 7PM-7AM, please contact  night-coverage [email or web address] Password [email or web address]     [1]  Allergies Allergen Reactions   Amoxicillin     REACTION: rash   Penicillins     REACTION: rash

## 2024-09-08 ENCOUNTER — Telehealth: Payer: Self-pay

## 2024-09-08 ENCOUNTER — Telehealth: Payer: Self-pay | Admitting: *Deleted

## 2024-09-08 NOTE — Telephone Encounter (Signed)
 Copied from CRM #[id number]. Topic: Clinical - Order For Equipment >> Sep 08, 2024  9:54 AM Benton KIDD wrote: Reason for CRM: patient husband spoke to ro tech for a portable oxygen machine . Ro tech told  patient husband that he would have to get the provider to send a prescription in . Patient husband is requesting a prescription to ro tech for a portable oxygen machine  [id number]  I called and spoke to pt. I informed pt that she would have to be qualified for a POC before we can rx one to her. Pt has an appt with Almarie Ferrari on Monday 09-12-24 and I informed pt that if she will keep her appt, we may can walk her to see if she qualifies. Pt verbalized understanding. NFN

## 2024-09-08 NOTE — Patient Instructions (Addendum)
 Visit Information  Thank you for taking time to visit with me today. Please don't hesitate to contact me if I can be of assistance to you before our next scheduled telephone appointment.  Our next appointment is by telephone on Tuesday, September 13, 2024 at 1:00 pm  Please call the care guide team at [phone] if you need to cancel or reschedule your appointment.   Patient Self Care Activities:  Attend all scheduled provider appointments Call provider office for new concerns or questions  Participate in Transition of Care Program/Attend TOC scheduled calls Take medications as prescribed   eliminate smoking in my home eliminate symptom triggers at home follow rescue plan if symptoms flare-up Continue pacing activity to avoid episodes of shortness of breath Continue to follow your established action plan for episodes of shortness of breath Continue using home oxygen as prescribed/ instructed Please consider weighing yourself every day to stay on top of early fluid retention: write down your weights every day so you remember what it is from day to day: follow the weight-gain guidelines and action plan to call your doctor if you gain more than 3 lbs overnight, or 5 lbs in one week If you believe your condition is getting worse- contact your care providers (doctors) promptly- reaching out to your doctor early when you have concerns can prevent you from having to go to the hospital  Following is a copy of your care plan:   Goals Addressed             This Visit's Progress    VBCI Transitions of Care (TOC) Care Plan   On track    Problems:  Recent Hospitalization for treatment of COPD No Hospital Follow Up Provider appointment -care coordination outreach in real-time with scheduling care guide to successfully schedule hospital follow up PCP appointment 09/19/24- per patient preference Recent hospitalization December 8-17, 2025 for cough/ SOB/ acute on chronic respiratory failure in  setting of COPD: new home O2 requirement- order Independent in self care: drives self at baseline; supportive spouse who is able to assist with needs; no assistive devices (1) unplanned hospital admission x last (6)/ (12) months  Goal:  Over the next 30 days, the patient will not experience hospital readmission  Interventions:  Transitions of Care: week # 1/ day # 1 Durable Medical Equipment (DME) needs assessed with patient/caregiver Doctor Visits  - discussed the importance of doctor visits Communication with PCP re: successful enrollment into TOC 30-day program Arranged PCP follow-up within 7 days (Care Guide Scheduled) Post discharge activity limitations prescribed by provider reviewed Reviewed Signs and symptoms of infection Confirmed not currently requiring/ using assistive devices/ independent in self-care activities Provided my direct contact information should questions/ concerns/ needs arise post-TOC initial call, prior to next TOC 30-day program RN CM telephone visit    COPD Interventions: Advised patient to track and manage COPD triggers Assessed social determinant of health barriers Discussed the importance of adequate rest and management of fatigue with COPD Provided education about and advised patient to utilize infection prevention strategies to reduce risk of respiratory infection Provided instruction about proper use of medications used for management of COPD including inhalers Screening for signs and symptoms of depression related to chronic disease state  Use of home oxygen Confirmed patient is NOT currently obtaining daily weights at home: currently weighing at home every 2 or 3 days- provided education / rationale for daily weight monitoring at home as earliest indicator of fluid retention, along with weight gain guidelines/  action plan for weight gain; importance of taking diuretic as prescribed Provided education re: signs and symptoms other than weight gain that  indicate fluid retention Confirmed/ reinforced action plan for shortness of breath: verbalizes excellent ongoing understanding/ adherence to same- getting used to using newly prescribed inhalers (rescue/ maintenance) Reinforced/ provided education around safe use/ maintenance of home O2 Re- provided and confirmed has contact information for pulmonary provider office and encouraged patient to schedule prompt hospital follow up visit  Patient Self Care Activities:  Attend all scheduled provider appointments Call provider office for new concerns or questions  Participate in Transition of Care Program/Attend TOC scheduled calls Take medications as prescribed   eliminate smoking in my home eliminate symptom triggers at home follow rescue plan if symptoms flare-up Continue pacing activity to avoid episodes of shortness of breath Continue to follow your established action plan for episodes of shortness of breath Continue using home oxygen as prescribed/ instructed Please consider weighing yourself every day to stay on top of early fluid retention: write down your weights every day so you remember what it is from day to day: follow the weight-gain guidelines and action plan to call your doctor if you gain more than 3 lbs overnight, or 5 lbs in one week If you believe your condition is getting worse- contact your care providers (doctors) promptly- reaching out to your doctor early when you have concerns can prevent you from having to go to the hospital  Plan:  Telephone follow up appointment with care management team member scheduled for:  Tuesday 09/13/24 at 1:00 pm       Care plan and visit instructions communicated with the patient verbally today. Patient agrees to receive a copy in MyChart. Active MyChart status and patient understanding of how to access instructions and care plan via MyChart confirmed with patient.     If you are experiencing a Mental Health or Behavioral Health Crisis or  need someone to talk to, please  call the Suicide and Crisis Lifeline: 988 call the [place]  National Suicide Prevention Lifeline: (234)884-9046 or TTY: 424-547-9322 TTY 803-317-8391) to talk to a trained counselor call 1-800-273-TALK (toll free, 24 hour hotline) go to [facility] [address], [place] [phone]) call the [facility] Crisis Line: [phone] call 911   Beatris Blinda Lawrence, RN, BSN, CCRN Alumnus RN Care Manager  Transitions of Care  VBCI - Population Health  [place] [phone]: direct office

## 2024-09-08 NOTE — Transitions of Care (Post Inpatient/ED Visit) (Signed)
 09/08/2024  Name: Stacey [name] MRN: [id number] DOB: 04/12/1956  Today's TOC FU Call Status: Today's TOC FU Call Status:: Successful TOC FU Call Completed TOC FU Call Complete Date: 09/08/24  Patient's Name and Date of Birth confirmed. Name, DOB  Transition Care Management Follow-up Telephone Call Date of Discharge: 09/07/24 Discharge Facility: [facility]) Type of Discharge: Inpatient Admission Primary Inpatient Discharge Diagnosis:: Acute on chronic respiratory failure with hypoxia; COPD exacerbation How have you been since you were released from the hospital?: Better (I am fine now, using the new home oxygen at 2 L/min and no shortness of breath.  Thanks for getting this appointment scheduled) Any questions or concerns?: No  Items Reviewed: Did you receive and understand the discharge instructions provided?: Yes (thoroughly reviewed with patient who verbalizes good understanding of same) Medications obtained,verified, and reconciled?: Yes (Medications Reviewed) (Full medication reconciliation/ review completed; no concerns or discrepancies identified; confirmed patient obtained/ is taking all newly Rx'd medications as instructed; self-manages medications and denies questions/ concerns around medications today) Any new allergies since your discharge?: No Dietary orders reviewed?: Yes Type of Diet Ordered:: Pretty much regular, healthy diet Do you have support at home?: Yes People in Home [RPT]: spouse Name of Support/Comfort Primary Source: Reports independent in self-care activities; resides with supportive spouse- assists as/ if needed/ indicated  Medications Reviewed Today: Medications Reviewed Today     Reviewed by Tab Rylee M, RN (Registered Nurse) on 09/08/24 at [phone]  Med List Status: <None>   Medication Order Taking? Sig Documenting Provider Last Dose Status Informant  albuterol  (VENTOLIN  HFA) 108 (90 Base) MCG/ACT inhaler [id number] Yes Inhale 2 puffs into  the lungs every 6 (six) hours as needed for wheezing or shortness of breath. Odell Celinda Balo, MD  Active   aspirin EC 81 MG tablet [id number] Yes Take 81 mg by mouth daily. Swallow whole. [provider]  Active Self, Pharmacy Records  budesonide -glycopyrrolate -formoterol  (BREZTRI  AEROSPHERE) 160-9-4.8 MCG/ACT AERO inhaler [id number] Yes Inhale 2 puffs into the lungs in the morning and at bedtime. Odell Celinda Balo, MD  Active   Calcium-Magnesium -Vitamin D  600-40-500 MG-MG-UNIT TB24 [id number] Yes Take 1 tablet by mouth daily. [provider]  Active Self, Pharmacy Records  Cholecalciferol (VITAMIN D -3) 1000 UNITS CAPS [id number] Yes Take 1 capsule by mouth daily. [provider]  Active Self, Pharmacy Records  diazepam  (VALIUM ) 5 MG tablet [id number] Yes Take 1 tablet (5 mg total) by mouth daily as needed for anxiety. Rollene Almarie LABOR, MD  Active Self, Pharmacy Records           Med Note CARLEEN, Inola D   Mon Aug 29, 2024  1:18 PM) Has at home - hasn't had to use in a while.  furosemide  (LASIX ) 40 MG tablet [id number] Yes Take 1 tablet (40 mg total) by mouth daily. Court Dorn PARAS, MD  Active Self, Pharmacy Records  Multiple Vitamins-Minerals (CENTRUM SILVER) tablet [id number] Yes Take 1 tablet by mouth daily. [provider]  Active Self, Pharmacy Records  spironolactone  (ALDACTONE ) 25 MG tablet [id number] Yes Take 1 tablet (25 mg total) by mouth daily. Goodrich, Callie E, PA-C  Active Self, Pharmacy Records  vitamin B-12 (CYANOCOBALAMIN ) 1000 MCG tablet [id number] Yes Take 1,000 mcg by mouth daily. [provider]  Active Self, Pharmacy Records  vitamin C (ASCORBIC ACID) 500 MG tablet [id number] Yes Take 500 mg by mouth daily. [provider]  Active Self, Pharmacy Records           Home  Care and Equipment/Supplies: Were Home Health Services Ordered?: No Any new equipment or medical supplies ordered?: Yes (home oxygen) Name of  Medical supply agency?: [employer] Were you able to get the equipment/medical supplies?: Yes Do you have any questions related to the use of the equipment/supplies?: No  Functional Questionnaire: Do you need assistance with bathing/showering or dressing?: No (husband supervises/ assists as indicated) Do you need assistance with meal preparation?: No (husband supervises/ assists as indicated) Do you need assistance with eating?: No Do you have difficulty maintaining continence: No Do you need assistance with getting out of bed/getting out of a chair/moving?: No Do you have difficulty managing or taking your medications?: No  Follow up appointments reviewed: PCP Follow-up appointment confirmed?: Yes (care coordination outreach in real-time with scheduling care guide to successfully schedule hospital follow up PCP appointment 09/12/24-- but called back later and re-scheduled for 09/19/24: due to conflict with another appointment) Date of PCP follow-up appointment?: 09/19/24 Follow-up Provider: PCP- covering provider: Corean Ku NP Specialist Hospital Follow-up appointment confirmed?: No Reason Specialist Follow-Up Not Confirmed: Patient has Specialist Provider Number and will Call for Appointment Do you need transportation to your follow-up appointment?: No Do you understand care options if your condition(s) worsen?: Yes-patient verbalized understanding  SDOH Interventions Today    Flowsheet Row Most Recent Value  SDOH Interventions   Food Insecurity Interventions Intervention Not Indicated  Housing Interventions Intervention Not Indicated  Transportation Interventions Intervention Not Indicated  [drives self at baseline,  husband assisting with transportation needs post- recent hospital discharge 09/07/24]  Utilities Interventions Intervention Not Indicated   See TOC assessment/ care plan tabs for additional assessment/ TOC intervention information  I appreciate the opportunity to  participate in [name]'s care,  Pls call/ message for questions,  Buffi Ewton Mckinney Mujtaba Bollig, RN, BSN, Media Planner  Transitions of Care  VBCI - [facility] [phone]: direct office

## 2024-09-11 NOTE — Progress Notes (Unsigned)
 "  @Patient  ID: Stacey [name], female    DOB: Feb 06, 1956, 68 y.o.   MRN: [id number]  No chief complaint on file.   Referring provider: Rollene Almarie LABOR, MD  HPI:  Chief complaint:   Patient being seen for sleep disordered breathing  HPI:  Accompanied by spouse Recently had a sleep study performed, suboptimal study and that she did not sleep very well only about 33% efficiency Study did show moderate obstructive sleep apnea, she was placed on oxygen supplementation during the study  Had her come to the office today to discuss findings and then further evaluation and management I believe she will need a titration study to ascertain optimal CPAP therapy and to ascertain whether she needs oxygen supplementation  She is an active smoker  Sleep is erratic  She stated that there was construction going on around the time she had a sleep study and she may elect to wait until the sleep lab relocates before having a titration study performed  Sleep habits are not significantly changed  Recently treated for heart failure, had developed leg swelling was treated diuretics, currently on Lasix  and spironolactone  - Appears to be doing relatively well from this perspective  Denies significant shortness of breath at rest Continues to follow-up with cardiology  Denies morning headaches does have night sweats, dryness of her mouth in the morning   09/12/2024- Interim hx Discussed the use of AI scribe software for clinical note transcription with the patient, who gave verbal consent to proceed.  History of Present Illness Stacey [name] is a 68 year old female with heart failure and sleep apnea who presents for a hospital follow-up after acute respiratory failure. She was originally seen by Dr. Rosita Martinis for sleep apnea.  She was hospitalized from December 7th to December 17th for acute respiratory failure with hypoxia and hypercarbia, secondary to acute bronchitis and a small  pneumothorax. During her hospitalization, she received treatment with IV steroids, bronchodilators, and antibiotics. A repeat CT chest showed resolution of the pneumothorax. She was discharged on a bronchodilator regimen with Breztri  and oxygen at two liters via nasal cannula.  She has a history of moderate obstructive sleep apnea with severe oxygen desaturations, as evidenced by a sleep study in August 2025 showing an AHI of 24.3 per hour and oxygen saturation dropping to 77%. A CPAP titration study has been ordered but not yet completed. Her sleep was fragmented during the initial sleep study due to construction noise near her room.  She has a history of heart failure and is currently on furosemide  40 mg once daily and spironolactone  25 mg daily. She monitors her weight daily due to potential fluid retention.  She is a former smoker, having quit on the day of her recent hospital admission. There is a concern for underlying COPD due to her smoking history, and a pulmonary function test has been ordered but not yet completed.  She uses Breztri  regularly but is unsure if she is using it correctly. She also uses Delsym  and Mucinex  cough drops for a persistent dry cough. She experiences a runny nose and uses saline nasal spray.  She is currently on oxygen therapy, with oxygen saturation on room air ranging from 89% to 95%. She has been using nicotine  patches since her hospitalization to aid in smoking cessation.    Allergies[1]  Immunization History  Administered Date(s) Administered   Fluad Quad(high Dose 65+) 08/27/2021   Influenza Split 07/21/2018   Influenza,inj,Quad PF,6+ Mos 07/21/2018   PFIZER(Purple Top)SARS-COV-2  Vaccination 12/15/2019, 01/05/2020   Td 09/22/2008   Tdap 09/22/2008    Past Medical History:  Diagnosis Date   Aortic atherosclerosis    Breast cancer (HCC) 09/22/2006   L side, s/p lumpectomy, chemotherapy and radiation   Centrilobular emphysema (HCC)    Chronic  combined systolic and diastolic heart failure (HCC)    Edema    Leg swelling    OSA (obstructive sleep apnea)    Panic disorder    SOB (shortness of breath)    Tobacco abuse     Tobacco History: Tobacco Use History[2] Ready to quit: Not Answered Counseling given: Not Answered   Outpatient Medications Prior to Visit  Medication Sig Dispense Refill   albuterol  (VENTOLIN  HFA) 108 (90 Base) MCG/ACT inhaler Inhale 2 puffs into the lungs every 6 (six) hours as needed for wheezing or shortness of breath. 6.7 g 2   aspirin EC 81 MG tablet Take 81 mg by mouth daily. Swallow whole.     budesonide -glycopyrrolate -formoterol  (BREZTRI  AEROSPHERE) 160-9-4.8 MCG/ACT AERO inhaler Inhale 2 puffs into the lungs in the morning and at bedtime. 10.7 g 2   Calcium-Magnesium -Vitamin D  600-40-500 MG-MG-UNIT TB24 Take 1 tablet by mouth daily.     Cholecalciferol (VITAMIN D -3) 1000 UNITS CAPS Take 1 capsule by mouth daily.     diazepam  (VALIUM ) 5 MG tablet Take 1 tablet (5 mg total) by mouth daily as needed for anxiety. 30 tablet 0   furosemide  (LASIX ) 40 MG tablet Take 1 tablet (40 mg total) by mouth daily. 90 tablet 3   Multiple Vitamins-Minerals (CENTRUM SILVER) tablet Take 1 tablet by mouth daily.     spironolactone  (ALDACTONE ) 25 MG tablet Take 1 tablet (25 mg total) by mouth daily. 90 tablet 2   vitamin B-12 (CYANOCOBALAMIN ) 1000 MCG tablet Take 1,000 mcg by mouth daily.     vitamin C (ASCORBIC ACID) 500 MG tablet Take 500 mg by mouth daily.     No facility-administered medications prior to visit.    Review of Systems  Review of Systems  Constitutional: Negative.   Respiratory: Negative.     Physical Exam  There were no vitals taken for this visit. Physical Exam Constitutional:      General: She is not in acute distress.    Appearance: Normal appearance. She is not ill-appearing.  HENT:     Head: Normocephalic and atraumatic.     Mouth/Throat:     Mouth: Mucous membranes are moist.   Cardiovascular:     Rate and Rhythm: Normal rate and regular rhythm.  Pulmonary:     Effort: Pulmonary effort is normal.     Breath sounds: Normal breath sounds. No wheezing, rhonchi or rales.  Skin:    General: Skin is warm and dry.  Neurological:     General: No focal deficit present.     Mental Status: She is alert and oriented to person, place, and time. Mental status is at baseline.  Psychiatric:        Mood and Affect: Mood normal.        Behavior: Behavior normal.        Thought Content: Thought content normal.        Judgment: Judgment normal.    Lab Results:  CBC    Component Value Date/Time   WBC 13.3 (H) 09/04/2024 0414   RBC 4.95 09/04/2024 0414   HGB 16.1 (H) 09/04/2024 0414   HCT 50.3 (H) 09/04/2024 0414   PLT 172 09/04/2024 0414   MCV 101.6 (  H) 09/04/2024 0414   MCH 32.5 09/04/2024 0414   MCHC 32.0 09/04/2024 0414   RDW 13.2 09/04/2024 0414   LYMPHSABS 0.4 (L) 08/29/2024 0305   MONOABS 0.1 08/29/2024 0305   EOSABS 0.0 08/29/2024 0305   BASOSABS 0.0 08/29/2024 0305    BMET    Component Value Date/Time   NA 137 09/04/2024 0414   NA 142 03/15/2024 0945   K 4.4 09/04/2024 0414   CL 95 (L) 09/04/2024 0414   CO2 31 09/04/2024 0414   GLUCOSE 137 (H) 09/04/2024 0414   BUN 25 (H) 09/04/2024 0414   BUN 18 03/15/2024 0945   CREATININE 0.73 09/04/2024 0414   CALCIUM 9.2 09/04/2024 0414   GFRNONAA >60 09/04/2024 0414    BNP No results found for: BNP  ProBNP    Component Value Date/Time   PROBNP <50.0 09/03/2024 1253   PROBNP 202.0 (H) 01/04/2024 1524    Imaging: CT CHEST WO CONTRAST Result Date: 09/06/2024 CLINICAL DATA:  Left-sided pneumothorax. EXAM: CT CHEST WITHOUT CONTRAST TECHNIQUE: Multidetector CT imaging of the chest was performed following the standard protocol without IV contrast. RADIATION DOSE REDUCTION: This exam was performed according to the departmental dose-optimization program which includes automated exposure control,  adjustment of the mA and/or kV according to patient size and/or use of iterative reconstruction technique. COMPARISON:  11/05/2023 and 12/29/2023 FINDINGS: Cardiovascular: Heart is normal size. Calcified plaque over the left anterior descending coronary artery. Thoracic aorta is normal in caliber. There is calcified plaque over the descending thoracic aorta. Pulmonary arterial system is unremarkable on this noncontrast exam. Remaining vascular structures are unremarkable. Mediastinum/Nodes: No mediastinal or hilar adenopathy. Remaining mediastinal structures are normal. Lungs/Pleura: Lungs are adequately inflated. Atelectatic change over the anteromedial right middle lobe as well as linear atelectasis right lower lobe. Previously seen left-sided pneumothorax is not visualized. Atelectatic change over the lower lobe and lingula. Mild posterior dependent atelectasis over the posterior left upper lobe. No effusion. Airways are unremarkable. Upper Abdomen: Minimal calcified plaque over the visualized upper abdominal aorta. Few small liver hypodensities unchanged and likely cysts, but too small to characterize stable partially visualized cyst over the upper pole left kidney. Musculoskeletal: No focal abnormality. IMPRESSION: 1. No acute cardiopulmonary disease. Previously seen left-sided pneumothorax is not visualized. 2. Atelectatic change over the right middle lobe, right lower lobe, left lower lobe and lingula. 3. Aortic atherosclerosis. Atherosclerotic coronary artery disease. 4. Few small liver hypodensities unchanged and likely cysts, but too small to characterize. Stable partially visualized cyst over the upper pole left kidney. Aortic Atherosclerosis (ICD10-I70.0). Electronically Signed   By: Toribio Agreste M.D.   On: 09/06/2024 08:10   DG CHEST PORT 1 VIEW Result Date: 09/05/2024 EXAM: 1 AP VIEW XRAY OF THE CHEST 09/05/2024 05:28:00 AM COMPARISON: 09/04/2024 CLINICAL HISTORY: Pneumothorax FINDINGS: LUNGS AND  PLEURA: Increased moderate left pleural effusion. Trace left apical pneumothorax, similar. Mildly increased interstitial markings. Increased left basilar homogeneous opacities. HEART AND MEDIASTINUM: Surgical clips over left hilum. Atherosclerotic calcifications. No acute abnormality of the cardiac and mediastinal silhouettes. BONES AND SOFT TISSUES: No acute osseous abnormality. IMPRESSION: 1. Trace left apical pneumothorax, similar to prior study. 2. Increased moderate left pleural effusion. 3. Increased left basilar homogeneous opacities. Electronically signed by: Waddell Calk MD 09/05/2024 06:53 AM EST RP Workstation: [id number]   DG CHEST PORT 1 VIEW Result Date: 09/04/2024 CLINICAL DATA:  [id number] Pneumothorax [id number] EXAM: PORTABLE CHEST 1 VIEW COMPARISON:  September 04, 2024, September 03, 2024 FINDINGS: The cardiomediastinal silhouette is unchanged  in contour.Sclerotic calcifications. Small LEFT pleural effusion. Favored trace LEFT apical pneumothorax. Bibasilar linear opacities, likely atelectasis. IMPRESSION: 1. Favored trace LEFT apical pneumothorax. 2. Small LEFT pleural effusion. Electronically Signed   By: Corean Salter M.D.   On: 09/04/2024 15:59   DG CHEST PORT 1 VIEW Result Date: 09/04/2024 CLINICAL DATA:  Pneumothorax ex. EXAM: PORTABLE CHEST 1 VIEW COMPARISON:  08/31/2024 FINDINGS: Findings suspicious for persistent left apical pneumothorax since CT yesterday despite lung marking seen cranial to the apparent pleural line. Left base collapse/consolidation again noted. The cardio pericardial silhouette is enlarged. No acute bony abnormality. Telemetry leads overlie the chest. IMPRESSION: 1. Findings suspicious for persistent left apical pneumothorax despite lung marking seen cranial to the apparent pleural line. 2. Persistent left base collapse/consolidation. Electronically Signed   By: Camellia Candle M.D.   On: 09/04/2024 07:57   CT Angio Chest Pulmonary Embolism (PE) W or WO  Contrast Addendum Date: 09/03/2024 ADDENDUM REPORT: 09/03/2024 15:57 ADDENDUM: Critical Value/emergent results were called by telephone at the time of interpretation on 09/03/2024 at 3:51 pm to provider [facility] PATEL , who verbally acknowledged these results. Electronically Signed   By: Leita Birmingham M.D.   On: 09/03/2024 15:57   Result Date: 09/03/2024 CLINICAL DATA:  Pulmonary embolism suspected, high probability. EXAM: CT ANGIOGRAPHY CHEST WITH CONTRAST TECHNIQUE: Multidetector CT imaging of the chest was performed using the standard protocol during bolus administration of intravenous contrast. Multiplanar CT image reconstructions and MIPs were obtained to evaluate the vascular anatomy. RADIATION DOSE REDUCTION: This exam was performed according to the departmental dose-optimization program which includes automated exposure control, adjustment of the mA and/or kV according to patient size and/or use of iterative reconstruction technique. CONTRAST:  75mL OMNIPAQUE  IOHEXOL  350 MG/ML SOLN COMPARISON:  02/04/2024. FINDINGS: Cardiovascular: The heart is enlarged and there is no pericardial effusion. Scattered coronary artery calcifications are noted. There is atherosclerotic calcification of the aorta without evidence of aneurysm. The pulmonary trunk is normal in caliber. No evidence of pulmonary embolism is seen. Mediastinum/Nodes: No enlarged mediastinal, hilar, or axillary lymph nodes. Thyroid  gland, trachea, and esophagus demonstrate no significant findings. A small amount air is noted in the epicardial fat. Lungs/Pleura: Small pneumothorax is present on the left of approximately 5-10%. Atelectasis is noted bilaterally. Centrilobular emphysema is noted. Peribronchial cuffing is present bilaterally, which may be infectious or inflammatory. Upper Abdomen: A few scattered hypodensities are present in the liver which are too small to further characterize. There is a cyst in the left kidney. No acute abnormality is  seen. Musculoskeletal: Degenerative changes are present in the thoracic spine. No acute osseous abnormality is seen. Review of the MIP images confirms the above findings. IMPRESSION: 1. No evidence of pulmonary embolism. 2. Small left pneumothorax of approximally 5-10%. 3. Bronchial wall thickening bilaterally, which may be infectious or inflammatory. 4. Atelectasis at the lung bases bilaterally. 5. Coronary artery calcifications and aortic atherosclerosis. Electronically Signed: By: Leita Birmingham M.D. On: 09/03/2024 15:45   DG CHEST PORT 1 VIEW Result Date: 08/31/2024 CLINICAL DATA:  Shortness of breath EXAM: PORTABLE CHEST 1 VIEW COMPARISON:  08/28/2024 FINDINGS: Normal-sized heart. Tortuous and partially calcified thoracic aorta. Minimal bibasilar atelectasis. Mild peribronchial thickening. Unremarkable bones. IMPRESSION: 1. Minimal bibasilar atelectasis. 2. Mild bronchitic changes. 3. Resolved changes of CHF. Electronically Signed   By: Elspeth Bathe M.D.   On: 08/31/2024 13:30   ECHOCARDIOGRAM COMPLETE Result Date: 08/29/2024    ECHOCARDIOGRAM REPORT   Patient Name:   Stacey [name] Date of  Exam: 08/29/2024 Medical Rec #:  [id number]      Height:       68.0 in Accession #:    [id number]     Weight:       209.4 lb Date of Birth:  1956/06/25      BSA:          2.084 m Patient Age:    68 years       BP:           120/70 mmHg Patient Gender: F              HR:           84 bpm. Exam Location:  Inpatient Procedure: 2D Echo (Both Spectral and Color Flow Doppler were utilized during            procedure). Indications:    CHF  History:        Patient has prior history of Echocardiogram examinations. CHF.  Sonographer:    Norleen Amour Referring Phys: [id number] JUSTIN B HOWERTER IMPRESSIONS  1. Left ventricular ejection fraction, by estimation, is 60 to 65%. Left ventricular ejection fraction by 2D MOD biplane is 65.5 %. The left ventricle has normal function. The left ventricle has no regional wall motion  abnormalities. Left ventricular diastolic parameters are consistent with Grade I diastolic dysfunction (impaired relaxation).  2. Right ventricular systolic function is normal. The right ventricular size is normal. Tricuspid regurgitation signal is inadequate for assessing PA pressure.  3. The mitral valve is grossly normal. Trivial mitral valve regurgitation.  4. The aortic valve was not well visualized. Aortic valve regurgitation is not visualized. Aortic valve sclerosis is present, with no evidence of aortic valve stenosis.  5. The inferior vena cava is normal in size with <50% respiratory variability, suggesting right atrial pressure of 8 mmHg. Comparison(s): Changes from prior study are noted. 02/22/2024: LVEF 45-50%. FINDINGS  Left Ventricle: Left ventricular ejection fraction, by estimation, is 60 to 65%. Left ventricular ejection fraction by 2D MOD biplane is 65.5 %. The left ventricle has normal function. The left ventricle has no regional wall motion abnormalities. The left ventricular internal cavity size was normal in size. There is no left ventricular hypertrophy. Left ventricular diastolic parameters are consistent with Grade I diastolic dysfunction (impaired relaxation). Indeterminate filling pressures. Right Ventricle: The right ventricular size is normal. No increase in right ventricular wall thickness. Right ventricular systolic function is normal. Tricuspid regurgitation signal is inadequate for assessing PA pressure. Left Atrium: Left atrial size was normal in size. Right Atrium: Right atrial size was normal in size. Pericardium: There is no evidence of pericardial effusion. Mitral Valve: The mitral valve is grossly normal. Trivial mitral valve regurgitation. MV peak gradient, 2.7 mmHg. The mean mitral valve gradient is 1.0 mmHg. Tricuspid Valve: The tricuspid valve is not well visualized. Tricuspid valve regurgitation is not demonstrated. Aortic Valve: The aortic valve was not well visualized.  Aortic valve regurgitation is not visualized. Aortic valve sclerosis is present, with no evidence of aortic valve stenosis. Aortic valve mean gradient measures 5.0 mmHg. Aortic valve peak gradient measures 10.4 mmHg. Aortic valve area, by VTI measures 1.87 cm. Pulmonic Valve: The pulmonic valve was not well visualized. Pulmonic valve regurgitation is not visualized. Aorta: The aortic root and ascending aorta are structurally normal, with no evidence of dilitation. Venous: The inferior vena cava is normal in size with less than 50% respiratory variability, suggesting right atrial pressure of 8 mmHg. IAS/Shunts: No atrial level  shunt detected by color flow Doppler.  LEFT VENTRICLE PLAX 2D                        Biplane EF (MOD) LVIDd:         5.20 cm         LV Biplane EF:   Left LVIDs:         2.90 cm                          ventricular LV PW:         0.80 cm                          ejection LV IVS:        0.90 cm                          fraction by LVOT diam:     1.90 cm                          2D MOD LV SV:         51                               biplane is LV SV Index:   24                               65.5 %. LVOT Area:     2.84 cm                                Diastology                                LV e' medial:    6.31 cm/s LV Volumes (MOD)               LV E/e' medial:  13.6 LV vol d, MOD    95.9 ml       LV e' lateral:   8.16 cm/s A2C:                           LV E/e' lateral: 10.5 LV vol d, MOD    78.8 ml A4C: LV vol s, MOD    31.3 ml A2C: LV vol s, MOD    30.2 ml A4C: LV SV MOD A2C:   64.6 ml LV SV MOD A4C:   78.8 ml LV SV MOD BP:    59.8 ml RIGHT VENTRICLE             IVC RV Basal diam:  3.40 cm     IVC diam: 1.80 cm RV S prime:     11.90 cm/s TAPSE (M-mode): 2.4 cm      PULMONARY VEINS                             Diastolic Velocity: 39.80 cm/s  S/D Velocity:       1.10                             Systolic Velocity:  42.80 cm/s LEFT ATRIUM             Index         RIGHT ATRIUM           Index LA diam:        3.10 cm 1.49 cm/m   RA Area:     15.50 cm LA Vol (A2C):   62.2 ml 29.84 ml/m  RA Volume:   41.70 ml  20.01 ml/m LA Vol (A4C):   32.0 ml 15.35 ml/m LA Biplane Vol: 47.1 ml 22.60 ml/m  AORTIC VALVE                     PULMONIC VALVE AV Area (Vmax):    1.55 cm      PV Vmax:       0.74 m/s AV Area (Vmean):   1.73 cm      PV Peak grad:  2.2 mmHg AV Area (VTI):     1.87 cm AV Vmax:           161.00 cm/s AV Vmean:          104.000 cm/s AV VTI:            0.271 m AV Peak Grad:      10.4 mmHg AV Mean Grad:      5.0 mmHg LVOT Vmax:         88.10 cm/s LVOT Vmean:        63.400 cm/s LVOT VTI:          0.179 m LVOT/AV VTI ratio: 0.66  AORTA Ao Root diam: 2.50 cm Ao Asc diam:  2.60 cm MITRAL VALVE MV Area (PHT): 4.01 cm    SHUNTS MV Area VTI:   2.38 cm    Systemic VTI:  0.18 m MV Peak grad:  2.7 mmHg    Systemic Diam: 1.90 cm MV Mean grad:  1.0 mmHg MV Vmax:       0.82 m/s MV Vmean:      56.8 cm/s MV Decel Time: 189 msec MV E velocity: 85.70 cm/s MV A velocity: 95.50 cm/s MV E/A ratio:  0.90 Vinie Maxcy MD Electronically signed by Vinie Maxcy MD Signature Date/Time: 08/29/2024/11:10:55 AM    Final    DG Chest Portable 1 View Result Date: 08/28/2024 CLINICAL DATA:  Shortness of breath, cough, and congestion. EXAM: PORTABLE CHEST 1 VIEW COMPARISON:  12/05/2023. FINDINGS: Heart is normal in size and the mediastinal contour is within normal limits. Atherosclerotic calcification of the aorta is noted. The pulmonary vasculature is distended. Interstitial prominence is noted bilaterally. No consolidation or pneumothorax is seen. There are suspected small bilateral pleural effusions. No acute osseous abnormality. IMPRESSION: 1. Distended pulmonary vasculature with interstitial prominence bilaterally, possible edema versus infection. 2. Suspected small bilateral pleural effusions. Electronically Signed   By: Leita Birmingham M.D.   On: 08/28/2024 16:55     Assessment & Plan:    Assessment and Plan Assessment & Plan Acute respiratory failure secondary to acute bronchitis and small pneumothorax (hospital follow-up) Recent hospitalization from December 7th to December 17th for acute respiratory failure due to acute bronchitis and small pneumothorax. Treated with IV steroids, bronchodilators, and antibiotics. Repeat CT chest showed resolved pneumothorax and no pleural effusion.  Discharged on Breztri  and oxygen therapy.  Chronic obstructive pulmonary disease COPD likely exacerbated by smoking history. CT scan showed small bronchial wall thickening. Smoking cessation achieved during hospitalization. Pulmonary function test needed to assess severity and lung function. - Continue Breztri  two puffs q 12 hours  - Schedule pulmonary function test to assess severity of COPD. - Prescribed promethazine  DM for cough. - Discussed potential for pulmonary rehabilitation in the future.  Allergic rhinitis - Prescribed Astelin  nasal spray twice daily for   Obstructive sleep apnea Severe moderate obstructive sleep apnea with oxygen desaturations. AHI of 24.3 with oxygen saturation dropping to 77%. CPAP titration study not yet completed.  - Will schedule CPAP titration study.  Heart failure Managed with diuretics. Oxygen therapy in place.  - Continue furosemide  40 mg once daily. - Continue spironolactone  25 mg daily. - Monitor for signs of fluid retention, such as swelling or weight gain.  Nicotine  dependence, cigarettes Recently quit smoking during hospitalization. Nicotine  patches prescribed to aid in cessation. - Prescribed nicotine  patches at 21 mg for smoking cessation.  Dependence on supplemental oxygen Dependent on supplemental oxygen due to COPD and heart failure. Current oxygen saturation varies between 89-95% at rest on room air. Portable oxygen concentrator evaluation pending due to equipment issues. - Ordered best fit evaluation for portable oxygen concentrator at  medical supply store. - Continue current oxygen therapy at 2 liters via nasal cannula.  Recording duration: 32 minutes  I personally spent a total of 45 minutes in the care of the patient today including performing a medically appropriate exam/evaluation, counseling and educating, placing orders, documenting clinical information in the EHR, and coordinating care.   Almarie LELON Ferrari, NP 09/11/2024     [1]  Allergies Allergen Reactions   Amoxicillin     REACTION: rash   Penicillins     REACTION: rash  [2]  Social History Tobacco Use  Smoking Status Every Day   Current packs/day: 1.50   Average packs/day: 1.5 packs/day for 45.0 years (67.5 ttl pk-yrs)   Types: Cigarettes  Smokeless Tobacco Never   "

## 2024-09-11 NOTE — Plan of Care (Signed)
  Problem: Education: Goal: Knowledge of General Education information will improve Description: Including pain rating scale, medication(s)/side effects and non-pharmacologic comfort measures Outcome: Progressing   Problem: Health Behavior/Discharge Planning: Goal: Ability to manage health-related needs will improve Outcome: Progressing   Problem: Clinical Measurements: Goal: Ability to maintain clinical measurements within normal limits will improve Outcome: Progressing Goal: Diagnostic test results will improve Outcome: Progressing   Problem: Activity: Goal: Risk for activity intolerance will decrease Outcome: Progressing   Problem: Nutrition: Goal: Adequate nutrition will be maintained Outcome: Progressing   Problem: Pain Managment: Goal: General experience of comfort will improve and/or be controlled Outcome: Progressing   Problem: Safety: Goal: Ability to remain free from injury will improve Outcome: Progressing

## 2024-09-12 ENCOUNTER — Telehealth: Payer: Self-pay | Admitting: Primary Care

## 2024-09-12 ENCOUNTER — Inpatient Hospital Stay: Admitting: Primary Care

## 2024-09-12 ENCOUNTER — Inpatient Hospital Stay: Admitting: Family Medicine

## 2024-09-12 ENCOUNTER — Encounter: Payer: Self-pay | Admitting: Primary Care

## 2024-09-12 VITALS — BP 122/64 | HR 105 | Temp 98.1°F | Ht 68.0 in | Wt 208.0 lb

## 2024-09-12 DIAGNOSIS — F17211 Nicotine dependence, cigarettes, in remission: Secondary | ICD-10-CM | POA: Diagnosis not present

## 2024-09-12 DIAGNOSIS — J9600 Acute respiratory failure, unspecified whether with hypoxia or hypercapnia: Secondary | ICD-10-CM

## 2024-09-12 DIAGNOSIS — I509 Heart failure, unspecified: Secondary | ICD-10-CM | POA: Diagnosis not present

## 2024-09-12 DIAGNOSIS — G4733 Obstructive sleep apnea (adult) (pediatric): Secondary | ICD-10-CM | POA: Diagnosis not present

## 2024-09-12 DIAGNOSIS — J209 Acute bronchitis, unspecified: Secondary | ICD-10-CM | POA: Diagnosis not present

## 2024-09-12 DIAGNOSIS — J9611 Chronic respiratory failure with hypoxia: Secondary | ICD-10-CM

## 2024-09-12 DIAGNOSIS — Z8709 Personal history of other diseases of the respiratory system: Secondary | ICD-10-CM | POA: Diagnosis not present

## 2024-09-12 DIAGNOSIS — J309 Allergic rhinitis, unspecified: Secondary | ICD-10-CM

## 2024-09-12 DIAGNOSIS — Z9981 Dependence on supplemental oxygen: Secondary | ICD-10-CM

## 2024-09-12 DIAGNOSIS — J449 Chronic obstructive pulmonary disease, unspecified: Secondary | ICD-10-CM | POA: Diagnosis not present

## 2024-09-12 MED ORDER — PROMETHAZINE-DM 6.25-15 MG/5ML PO SYRP: 5.0000 mL | ORAL_SOLUTION | Freq: Four times a day (QID) | ORAL | 0 refills | Status: DC | PRN

## 2024-09-12 MED ORDER — NICOTINE 21 MG/24HR TD PT24: 21.0000 mg | MEDICATED_PATCH | Freq: Every day | TRANSDERMAL | 0 refills | Status: AC

## 2024-09-12 MED ORDER — AZELASTINE HCL 0.1 % NA SOLN: 1.0000 | Freq: Two times a day (BID) | NASAL | 1 refills | Status: AC

## 2024-09-12 NOTE — Patient Instructions (Signed)
" °  VISIT SUMMARY: Today, we reviewed your recent hospitalization for acute respiratory failure due to acute bronchitis and a small pneumothorax. We discussed your ongoing treatment for COPD, obstructive sleep apnea, heart failure, and nicotine  dependence. We also addressed your need for supplemental oxygen and evaluated your current medications and therapies.  YOUR PLAN: -ACUTE RESPIRATORY FAILURE SECONDARY TO ACUTE BRONCHITIS AND SMALL PNEUMOTHORAX: You were hospitalized for acute respiratory failure caused by acute bronchitis and a small pneumothorax. Your treatment included IV steroids, bronchodilators, and antibiotics. The pneumothorax has resolved. Continue using Breztri  two puffs twice daily and oxygen therapy at 2 liters via nasal cannula continuously.  -CHRONIC OBSTRUCTIVE PULMONARY DISEASE (COPD): COPD is a chronic lung disease often caused by smoking. We need to assess the severity of your COPD with a pulmonary function test. You have been prescribed promethazine  DM for your cough and a nasal spray for your runny nose. We also discussed the potential for pulmonary rehabilitation in the future.  -OBSTRUCTIVE SLEEP APNEA: Obstructive sleep apnea is a condition where your breathing stops and starts during sleep. We have ordered an auto CPAP machine with self-adjusting settings to help manage this. A CPAP titration study will be scheduled, and we will monitor the CPAP data for effectiveness.  -HEART FAILURE: Heart failure means your heart is not pumping blood as well as it should. Continue taking furosemide  40 mg once daily and spironolactone  25 mg daily. Monitor for signs of fluid retention, such as swelling or weight gain.  -NICOTINE  DEPENDENCE, CIGARETTES: You have recently quit smoking and are using nicotine  patches to help with cessation. Continue using the nicotine  patches at 21 mg.  -DEPENDENCE ON SUPPLEMENTAL OXYGEN: You need supplemental oxygen due to COPD and heart failure. Your current  oxygen saturation on room air is 91-95%. We have ordered an evaluation for a portable oxygen concentrator. Continue your current oxygen therapy at 2 liters via nasal cannula.  INSTRUCTIONS: Please follow up with the pulmonary function test and CPAP titration study as scheduled. Continue monitoring your weight daily and watch for signs of fluid retention. Use your medications as prescribed and continue with the nicotine  patches. We will evaluate the best fit for a portable oxygen concentrator at a medical supply store.   Happy holidays!! Check out [facility] [facility] [employer] [facility] for reservations at the [facility]. I also like [facility]  "

## 2024-09-12 NOTE — Telephone Encounter (Signed)
 We need CPAP titration scheduled and PFTs

## 2024-09-13 ENCOUNTER — Other Ambulatory Visit: Payer: Self-pay | Admitting: *Deleted

## 2024-09-13 NOTE — Patient Instructions (Signed)
 Visit Information  Thank you for taking time to visit with me today. Please don't hesitate to contact me if I can be of assistance to you before our next scheduled telephone appointment.  Our next appointment is by telephone on Wednesday, September 21, 2024 at 1:00 pm  Please call the care guide team at [phone] if you need to cancel or reschedule your appointment.   Following are the goals we discussed today:  Patient Self Care Activities:  Attend all scheduled provider appointments Call provider office for new concerns or questions  Participate in Transition of Care Program/Attend TOC scheduled calls Take medications as prescribed   eliminate smoking in my home eliminate symptom triggers at home follow rescue plan if symptoms flare-up Continue pacing activity to avoid episodes of shortness of breath Continue to follow your established action plan for episodes of shortness of breath Continue using home oxygen as prescribed/ instructed Please consider weighing yourself every day to stay on top of early fluid retention: write down your weights every day so you remember what it is from day to day: follow the weight-gain guidelines and action plan to call your doctor if you gain more than 3 lbs overnight, or 5 lbs in one week If you believe your condition is getting worse- contact your care providers (doctors) promptly- reaching out to your doctor early when you have concerns can prevent you from having to go to the hospital  If you are experiencing a Mental Health or Behavioral Health Crisis or need someone to talk to, please  call the Suicide and Crisis Lifeline: 988 call the [place]  National Suicide Prevention Lifeline: 508-503-3819 or TTY: 226-078-2535 TTY 445-515-5582) to talk to a trained counselor call 1-800-273-TALK (toll free, 24 hour hotline) go to [facility] [address] [place] [phone]) call the [facility] Crisis Line:  [phone] call 911   Care plan and visit instructions communicated with the patient verbally today. Patient agrees to receive a copy in MyChart. Active MyChart status and patient understanding of how to access instructions and care plan via MyChart confirmed with patient.     Stacey Antrobus Mckinney Ziara Thelander, RN, BSN, Media Planner  Transitions of Care  [facility] - [facility] [phone]: direct office

## 2024-09-13 NOTE — Transitions of Care (Post Inpatient/ED Visit) (Signed)
 " Transition of Care week 2/ day # 6  Visit Note  09/13/2024  Name: Stacey [name] MRN: [id number]          DOB: [birth year]  Situation: Patient enrolled in [facility] 30-day program. Visit completed with patient by telephone.   HIPAA identifiers x 2 verified  Background:  Recent hospitalization December 8-17, 2025 for cough/ SOB/ acute on chronic respiratory failure in setting of COPD: new home O2 requirement- order Independent in self care: drives self at baseline; supportive spouse who is able to assist with needs; no assistive devices (1) unplanned hospital admission x last (6)/ (12) months  Initial Transition Care Management Follow-up Telephone Call Discharge Date and Diagnosis: 09/07/24, Acute on chronic respiratory failure with hypoxia; COPD exacerbation   Past Medical History:  Diagnosis Date   Aortic atherosclerosis    Breast cancer (HCC) 09/22/2006   L side, s/p lumpectomy, chemotherapy and radiation   Centrilobular emphysema (HCC)    Chronic combined systolic and diastolic heart failure (HCC)    Edema    Leg swelling    OSA (obstructive sleep apnea)    Panic disorder    SOB (shortness of breath)    Tobacco abuse    Assessment: I am doing okay; still using the oxygen at 2 L/min--- but I have tried to 'test myself' to see how long I can go without it- one time I was off it for an hour and a half and my oxygen level dropped to the low 80's and I felt a little dizzy, so I put it back on.  I will do as you say and no longer do these 'testing' sessions; still not smoking; when I have the oxygen on, I feel fine and my oxygen levels are in the mid-high 90's.  I did start weighing myself at home every day and have not gained any overnight weight.  The pulmonary appointment went good yesterday; I am getting used to using the maintenance inhaler- have not needed to use the rescue inhaler    Denies clinical concerns and sounds to be in no distress throughout TOC 30-day program outreach  call today   Patient Reported Symptoms: Cognitive Cognitive Status: Normal speech and language skills, Alert and oriented to person, place, and time, Insightful and able to interpret abstract concepts Cognitive/Intellectual Conditions Management [RPT]: None reported or documented in medical history or problem list      Neurological Neurological Review of Symptoms: No symptoms reported    HEENT HEENT Symptoms Reported: No symptoms reported      Cardiovascular Cardiovascular Symptoms Reported: No symptoms reported, Other: Other Cardiovascular Symptoms: Patient confirms she has now started monitoring and recording daily weights at home after initial TOC call on 09/08/24 Does patient have uncontrolled Hypertension?: No Cardiovascular Management Strategies: Routine screening, Medication therapy, Coping strategies, Adequate rest, Weight management, Diet modification Do You Have a Working Readable Scale?: Yes Weight: 206 lb (93.4 kg) (home reported weight from 09/13/24)  Respiratory Respiratory Symptoms Reported: No symptoms reported Other Respiratory Symptoms: Denies shortness of breath; confirms continues using home O2 as instructed:  2 L/min most of the time; reports incident where she was testing herself to see how long she could tolerate being off home O2: reports oxygen leveles dropped down to low 80's without other symptoms, one time thought, I got a little dizzy feeling; reports when on home O2 at 2 L/min: oxygen levels stay up to mid-high 90's; patient was counseled to not test herself in this manner again- verbalizes understanding  and agreement with these directives; reports easily using inhalers now; confirms has not needed rescue inhaler recently; reviewed pulmonary office visit 09/12/24; confirms patient has contoniued not smoking post- hospital discharge 09/07/24 Respiratory Management Strategies: Asthma action plan, Adequate rest, Activity, Coping strategies, Oxygen therapy,  Medication therapy, Routine screening, Weight management  Endocrine Endocrine Symptoms Reported: No symptoms reported Is patient diabetic?: No    Gastrointestinal Gastrointestinal Symptoms Reported: No symptoms reported Additional Gastrointestinal Details: Eating good, too good now that I quit smoking; confirms last BM earlier today- normal      Genitourinary Genitourinary Symptoms Reported: No symptoms reported    Integumentary Integumentary Symptoms Reported: No symptoms reported    Musculoskeletal Musculoskelatal Symptoms Reviewed: No symptoms reported Additional Musculoskeletal Details: confirmed not currently requiring/ using assistive devices for ambulation; confirmed no new/ recent falls post- hospital discharge Musculoskeletal Management Strategies: Coping strategies      Psychosocial Psychosocial Symptoms Reported: No symptoms reported Behavioral Management Strategies: Support system, Coping strategies Major Change/Loss/Stressor/Fears (CP): Denies Techniques to Cope with Loss/Stress/Change: Diversional activities Quality of Family Relationships: supportive, involved, helpful   Today's Vitals   09/13/24 1300  SpO2: 94%  Weight: 206 lb (93.4 kg)      Medications Reviewed Today     Reviewed by Dianca Owensby M, RN (Registered Nurse) on 09/13/24 at 1309  Med List Status: <None>   Medication Order Taking? Sig Documenting Provider Last Dose Status Informant  albuterol  (VENTOLIN  HFA) 108 (90 Base) MCG/ACT inhaler [id number] Yes Inhale 2 puffs into the lungs every 6 (six) hours as needed for wheezing or shortness of breath. Odell Celinda Balo, MD  Active   aspirin EC 81 MG tablet [id number]  Take 81 mg by mouth daily. Swallow whole. [provider]  Active Self, Pharmacy Records  azelastine  (ASTELIN ) 0.1 % nasal spray [id number]  Place 1 spray into both nostrils 2 (two) times daily. Use in each nostril as directed Hope Almarie ORN, NP  Active    budesonide -glycopyrrolate -formoterol  (BREZTRI  AEROSPHERE) 160-9-4.8 MCG/ACT AERO inhaler [id number] Yes Inhale 2 puffs into the lungs in the morning and at bedtime. Odell Celinda Balo, MD  Active   Calcium-Magnesium -Vitamin D  600-40-500 MG-MG-UNIT TB24 [id number]  Take 1 tablet by mouth daily. [provider]  Active Self, Pharmacy Records  Cholecalciferol (VITAMIN D -3) 1000 UNITS CAPS [id number]  Take 1 capsule by mouth daily. [provider]  Active Self, Pharmacy Records  diazepam  (VALIUM ) 5 MG tablet [id number]  Take 1 tablet (5 mg total) by mouth daily as needed for anxiety. Rollene Almarie LABOR, MD  Active Self, Pharmacy Records           Med Note CARLEEN, [place] D   Mon Aug 29, 2024  1:18 PM) Has at home - hasn't had to use in a while.  furosemide  (LASIX ) 40 MG tablet [id number] Yes Take 1 tablet (40 mg total) by mouth daily. Court Dorn PARAS, MD  Active Self, Pharmacy Records  Multiple Vitamins-Minerals (CENTRUM SILVER) tablet [id number]  Take 1 tablet by mouth daily. [provider]  Active Self, Pharmacy Records  nicotine  (NICODERM CQ  - DOSED IN MG/24 HOURS) 21 mg/24hr patch [id number]  Place 1 patch (21 mg total) onto the skin daily. Hope Almarie ORN, NP  Active   promethazine -dextromethorphan  (PROMETHAZINE -DM) 6.25-15 MG/5ML syrup [id number] Yes Take 5 mLs by mouth 4 (four) times daily as needed. Hope Almarie ORN, NP  Active   spironolactone  (ALDACTONE ) 25 MG tablet [id number]  Take 1 tablet (25 mg total) by mouth daily. Goodrich, Callie  E, PA-C  Active Self, Pharmacy Records  vitamin B-12 (CYANOCOBALAMIN ) 1000 MCG tablet [id number]  Take 1,000 mcg by mouth daily. [provider]  Active Self, Pharmacy Records  vitamin C (ASCORBIC ACID) 500 MG tablet [id number]  Take 500 mg by mouth daily. [provider]  Active Self, Pharmacy Records           Recommendation:   PCP Follow-up- as scheduled 09/19/24 Continue Current Plan of Care  Follow  Up Plan:   Telephone follow-up in 1 week- as scheduled 09/21/24  I appreciate the opportunity to participate in Rupal's care,  Pls call/ message for questions,  Meshell Abdulaziz Mckinney Broden Holt, RN, BSN, CCRN Alumnus RN Care Manager  Transitions of Care  [facility] - [facility] [phone]: direct office     "

## 2024-09-13 NOTE — Telephone Encounter (Signed)
 Thanks

## 2024-09-19 ENCOUNTER — Ambulatory Visit: Admitting: Family Medicine

## 2024-09-19 ENCOUNTER — Encounter: Payer: Self-pay | Admitting: Family Medicine

## 2024-09-19 ENCOUNTER — Ambulatory Visit (INDEPENDENT_AMBULATORY_CARE_PROVIDER_SITE_OTHER)

## 2024-09-19 VITALS — BP 90/58 | HR 69 | Temp 97.9°F | Ht 68.0 in | Wt 209.4 lb

## 2024-09-19 DIAGNOSIS — R600 Localized edema: Secondary | ICD-10-CM | POA: Diagnosis not present

## 2024-09-19 DIAGNOSIS — J9601 Acute respiratory failure with hypoxia: Secondary | ICD-10-CM

## 2024-09-19 DIAGNOSIS — Z79899 Other long term (current) drug therapy: Secondary | ICD-10-CM | POA: Diagnosis not present

## 2024-09-19 DIAGNOSIS — J9612 Chronic respiratory failure with hypercapnia: Secondary | ICD-10-CM

## 2024-09-19 DIAGNOSIS — I5042 Chronic combined systolic (congestive) and diastolic (congestive) heart failure: Secondary | ICD-10-CM

## 2024-09-19 DIAGNOSIS — J449 Chronic obstructive pulmonary disease, unspecified: Secondary | ICD-10-CM

## 2024-09-19 DIAGNOSIS — F17211 Nicotine dependence, cigarettes, in remission: Secondary | ICD-10-CM

## 2024-09-19 LAB — COMPREHENSIVE METABOLIC PANEL WITH GFR
ALT: 39 U/L — ABNORMAL HIGH (ref 3–35)
AST: 25 U/L (ref 5–37)
Albumin: 3.7 g/dL (ref 3.5–5.2)
Alkaline Phosphatase: 54 U/L (ref 39–117)
BUN: 14 mg/dL (ref 6–23)
CO2: 37 meq/L — ABNORMAL HIGH (ref 19–32)
Calcium: 9.2 mg/dL (ref 8.4–10.5)
Chloride: 98 meq/L (ref 96–112)
Creatinine, Ser: 0.74 mg/dL (ref 0.40–1.20)
GFR: 82.4 mL/min
Glucose, Bld: 105 mg/dL — ABNORMAL HIGH (ref 70–99)
Potassium: 3.9 meq/L (ref 3.5–5.1)
Sodium: 141 meq/L (ref 135–145)
Total Bilirubin: 0.7 mg/dL (ref 0.2–1.2)
Total Protein: 6.5 g/dL (ref 6.0–8.3)

## 2024-09-19 LAB — CBC WITH DIFFERENTIAL/PLATELET
Basophils Absolute: 0 K/uL (ref 0.0–0.1)
Basophils Relative: 0.5 % (ref 0.0–3.0)
Eosinophils Absolute: 0.1 K/uL (ref 0.0–0.7)
Eosinophils Relative: 1.5 % (ref 0.0–5.0)
HCT: 44.9 % (ref 36.0–46.0)
Hemoglobin: 15 g/dL (ref 12.0–15.0)
Lymphocytes Relative: 24.9 % (ref 12.0–46.0)
Lymphs Abs: 1.9 K/uL (ref 0.7–4.0)
MCHC: 33.5 g/dL (ref 30.0–36.0)
MCV: 97 fl (ref 78.0–100.0)
Monocytes Absolute: 0.9 K/uL (ref 0.1–1.0)
Monocytes Relative: 11.1 % (ref 3.0–12.0)
Neutro Abs: 4.8 K/uL (ref 1.4–7.7)
Neutrophils Relative %: 62 % (ref 43.0–77.0)
Platelets: 176 K/uL (ref 150.0–400.0)
RBC: 4.63 Mil/uL (ref 3.87–5.11)
RDW: 13.3 % (ref 11.5–15.5)
WBC: 7.7 K/uL (ref 4.0–10.5)

## 2024-09-19 LAB — BRAIN NATRIURETIC PEPTIDE: Pro B Natriuretic peptide (BNP): 39 pg/mL (ref 1.0–100.0)

## 2024-09-19 NOTE — Patient Instructions (Addendum)
 We are checking labs today, will be in contact with any results that require further attention.  Continue current medication regimen.   Will send order over for portable oxygen concentrator.  Continue oxygen at 1.5-2 LPM at home continuously to keep oxygen in range and heart rate stable.  Increase fluids intake for right now, may increase salt temporarily to increase blood pressure too.   Follow up with pulmonology as scheduled.   Follow-up with me for new or worsening symptoms.

## 2024-09-19 NOTE — Progress Notes (Unsigned)
 "  Acute Office Visit  Subjective:     Patient ID: Stacey [name], female    DOB: October 31, 1955, 68 y.o.   MRN: [id number]  Chief Complaint  Patient presents with   Hospitalization Follow-up    C/o congested cough that has worsened since d/c to the point of gagging using delsym  wants to discuss a new pulmonologist     HPI  Discussed the use of AI scribe software for clinical note transcription with the patient, who gave verbal consent to proceed.  History of Present Illness Stacey [name] is a 68 year old female with a history of pneumothorax who presents with a persistent cough.  Cough and sputum production - Persistent cough since recent hospitalization for pneumothorax triggered by severe coughing. - Cough is associated with significant mucus production, which she often swallows due to gagging. - Cough worsens after showering and when oxygen saturation rises above 93-94%. - Prescribed cough medications from pulmonary are less effective than over-the-counter options.  Oxygen therapy and hypoxemia - Uses continuous oxygen prescribed 24 hours a day. - Adjusts oxygen flow to maintain saturation around 92-93%, as higher levels provoke coughing. - Feels dizzy without oxygen. - Caregiver observes she can maintain low 90s saturation without oxygen for short periods. - Difficulty qualifying for a portable oxygen machine due to lack of testing equipment.  History of pneumothorax - Recent hospitalization for pneumothorax, which resolved during admission. - Pneumothorax was triggered by severe coughing.  Tobacco use and smoking cessation - Recently reduced smoking from 1.5 packs per day to two cigarettes over two weeks. - Using a 21 mg nicotine  patch for smoking cessation.  Fluid management and diuretic therapy - Takes Lasix  and spironolactone  for fluid management. - Diuretics are less effective at producing diuresis than during hospitalization. - Weight has remained stable around 204  to 206 pounds.     ROS Per HPI      Objective:    BP (!) 90/58 (BP Location: Right Arm, Patient Position: Sitting)   Pulse 69   Temp 97.9 F (36.6 C) (Temporal)   Ht 5' 8 (1.727 m)   Wt 209 lb 6.4 oz (95 kg)   SpO2 96%   BMI 31.84 kg/m    Physical Exam Vitals and nursing note reviewed.  Constitutional:      General: She is not in acute distress.    Appearance: Normal appearance. She is normal weight.  HENT:     Head: Normocephalic and atraumatic.     Right Ear: External ear normal.     Left Ear: External ear normal.     Nose: Nose normal.     Mouth/Throat:     Mouth: Mucous membranes are moist.     Pharynx: Oropharynx is clear.  Eyes:     Extraocular Movements: Extraocular movements intact.     Pupils: Pupils are equal, round, and reactive to light.  Cardiovascular:     Rate and Rhythm: Normal rate and regular rhythm.     Pulses: Normal pulses.     Heart sounds: Normal heart sounds.  Pulmonary:     Effort: Pulmonary effort is normal. No respiratory distress.     Breath sounds: Normal breath sounds. No wheezing, rhonchi or rales.  Musculoskeletal:        General: Normal range of motion.     Cervical back: Normal range of motion.     Right lower leg: No edema.     Left lower leg: No edema.  Lymphadenopathy:  Cervical: No cervical adenopathy.  Neurological:     General: No focal deficit present.     Mental Status: She is alert and oriented to person, place, and time.  Psychiatric:        Mood and Affect: Mood normal.        Thought Content: Thought content normal.   SATURATION QUALIFICATIONS: (This note is used to comply with regulatory documentation for home oxygen)   Patient Saturations on Room Air at Rest = 84%   Patient Saturations on Room Air while Ambulating = 80%   Patient Saturations on 2 Liters of oxygen POC while Ambulating = 96%   Please briefly explain why patient needs home oxygen:  Pt qualifies for home oxygen, needs 2 L oxygen to  maintain oxygen saturations  DME: [employer]  No results found for any visits on 09/19/24.      Assessment & Plan:   Assessment and Plan Assessment & Plan Acute hypoxic on chronic hypercapnic respiratory failure Persistent hypoxia and cough with recent respiratory failure and pneumothorax. Concerns about pneumonia due to crackles and mucus production. Difficulty qualifying for portable oxygen due to testing issues. Cough exacerbated by high oxygen levels due to trapped carbon dioxide. - Ordered chest x-ray to evaluate for pneumonia or fluid accumulation. - Continue supplemental oxygen therapy, adjusting flow to maintain oxygen saturation around 90%. - Encouraged pursed lip breathing exercises to manage carbon dioxide retention. - Attempted to qualify for portable oxygen concentrator through [employer]. - Consider referral to Dr. Gussie HERO. for further pulmonology management.  Nicotine  dependence History of smoking a pack and a half a day, with two relapses since discharge, most recently smoking two cigarettes in two weeks. Using 21 mg nicotine  patches as per discharge plan. - Continue 21 mg nicotine  patches as prescribed. - Encouraged continued abstinence from smoking.  Bilateral lower extremity edema Edema likely due to recent hospitalization and fluid retention. Currently managed with Lasix  and spironolactone . - Checked BNP levels to assess fluid status. - Continue Lasix  and spironolactone  as prescribed. - Monitor weight and fluid intake.     Orders Placed This Encounter  Procedures   DG Chest 2 View    Standing Status:   Future    Expiration Date:   03/20/2025    Reason for Exam (SYMPTOM  OR DIAGNOSIS REQUIRED):   cough, sob    Preferred imaging location?:   [facility]   CBC with Differential/Platelet    Release to patient:   Immediate [1]   Comprehensive metabolic panel with GFR    Release to patient:   Immediate [1]   B Nat Peptide     No orders of the defined types  were placed in this encounter.   No follow-ups on file.  Corean LITTIE Ku, FNP  "

## 2024-09-20 ENCOUNTER — Ambulatory Visit: Payer: Self-pay | Admitting: Family Medicine

## 2024-09-20 ENCOUNTER — Encounter: Payer: Self-pay | Admitting: Family Medicine

## 2024-09-20 NOTE — Telephone Encounter (Signed)
 Copied from CRM #[id number]. Topic: Clinical - Lab/Test Results >> Sep 20, 2024 10:49 AM Revonda D wrote: Reason for CRM: Pt's husband is requesting to speak with Corean Alvia BRISTLE, or her nurse in regards to the imaging results they received yesterday. He stated that they are unable to read the results and would like for someone to give the pt a callback today.

## 2024-09-21 ENCOUNTER — Other Ambulatory Visit: Payer: Self-pay | Admitting: *Deleted

## 2024-09-21 NOTE — Patient Instructions (Signed)
 Visit Information  Thank you for taking time to visit with me today. Please don't hesitate to contact me if I can be of assistance to you before our next scheduled telephone appointment.  Our next appointment is by telephone on Wednesday, September 27, 2024 at 1:00 pm  Please call the care guide team at [phone] if you need to cancel or reschedule your appointment.   Following are the goals we discussed today:  Patient Self Care Activities:  Attend all scheduled provider appointments Call provider office for new concerns or questions  Participate in Transition of Care Program/Attend TOC scheduled calls Take medications as prescribed   eliminate smoking in my home eliminate symptom triggers at home follow rescue plan if symptoms flare-up Continue pacing activity to avoid episodes of shortness of breath Continue to follow your established action plan for episodes of shortness of breath Continue using home oxygen as prescribed/ instructed Please consider weighing yourself every day to stay on top of early fluid retention: write down your weights every day so you remember what it is from day to day: follow the weight-gain guidelines and action plan to call your doctor if you gain more than 3 lbs overnight, or 5 lbs in one week If you believe your condition is getting worse- contact your care providers (doctors) promptly- reaching out to your doctor early when you have concerns can prevent you from having to go to the hospital  If you are experiencing a Mental Health or Behavioral Health Crisis or need someone to talk to, please  call the Suicide and Crisis Lifeline: 988 call the [place]  National Suicide Prevention Lifeline: 3191578449 or TTY: 289-255-9112 TTY 775-724-4150) to talk to a trained counselor call 1-800-273-TALK (toll free, 24 hour hotline) go to [facility] [address], [place] [phone]) call the [facility] Crisis Line:  [phone] call 911   Care plan and visit instructions communicated with the patient verbally today. Patient agrees to receive a copy in MyChart. Active MyChart status and patient understanding of how to access instructions and care plan via MyChart confirmed with patient.     Ajayla Iglesias Mckinney Vincy Feliz, RN, BSN, Media Planner  Transitions of Care  [facility] - [facility] [phone]: direct office

## 2024-09-21 NOTE — Transitions of Care (Post Inpatient/ED Visit) (Signed)
 " Transition of Care week 3/ day # 14  Visit Note  09/21/2024  Name: Stacey [name] MRN: [id number]          DOB: May 23, 1956  Situation: Patient enrolled in [facility] 30-day program. Visit completed with patient and her spouse Velinda verified on [facility] DPR by telephone while phone on speaker mode  HIPAA identifiers x 2 verified  Background:  Recent hospitalization December 8-17, 2025 for cough/ SOB/ acute on chronic respiratory failure in setting of COPD: new home O2 requirement- order Independent in self care: drives self at baseline; supportive spouse who is able to assist with needs; no assistive devices (1) unplanned hospital admission x last (6)/ (12) months  Initial Transition Care Management Follow-up Telephone Call Discharge Date and Diagnosis: 09/07/24, Acute on chronic respiratory failure with hypoxia; COPD exacerbation   Past Medical History:  Diagnosis Date   Aortic atherosclerosis    Breast cancer (HCC) 09/22/2006   L side, s/p lumpectomy, chemotherapy and radiation   Centrilobular emphysema (HCC)    Chronic combined systolic and diastolic heart failure (HCC)    Edema    Leg swelling    OSA (obstructive sleep apnea)    Panic disorder    SOB (shortness of breath)    Tobacco abuse    Assessment:  I am doing better-- felt so much better after seeing Corean this week- she was so reassuring; said I may not need the home Oxygen 'forever'; when I saw the pulmonary doctor, I was very discouraged because she was not at all reassuring.  I am now able to take short breaks from the home O2 and am tolerating it okay, haven't needed the rescue inhaler but using the maintenance inhaler twice a day like I am supposed to.  I want to be able to see the same doctor for pulmonary that I saw when I was in the hospital-- I have his name, I am not sure if I can just call him to schedule an appointment, instead of going back to the other doctor we saw.  I am waiting to find out if I will qualify for  the lightweight portable oxygen- Corean said she would also order this, because I have not heard one word back after the first pulmonary appointment where they said they would order it; it has just been overwhelming    Denies clinical concerns and sounds to be in no distress throughout [facility] 30-day program outreach call today  Patient Reported Symptoms: Cognitive Cognitive Status: Normal speech and language skills, Alert and oriented to person, place, and time, Insightful and able to interpret abstract concepts Cognitive/Intellectual Conditions Management [RPT]: None reported or documented in medical history or problem list      Neurological Neurological Review of Symptoms: No symptoms reported    HEENT HEENT Symptoms Reported: No symptoms reported      Cardiovascular Cardiovascular Symptoms Reported: No symptoms reported Does patient have uncontrolled Hypertension?: No Cardiovascular Management Strategies: Routine screening, Medication therapy, Coping strategies, Adequate rest, Weight management, Diet modification Weight: 205 lb (93 kg) (home reported weight from 09/21/24)  Respiratory Respiratory Symptoms Reported: Productive cough Other Respiratory Symptoms: Denies shortness of breath; confirms continues using home O2 as prescribed, between 1-half-- 2 L/min; reports occasionally taking short breaks from home O2, occasionally experiences lightheadedness when off home O2; presistent intermittent productive cough- clear sputum;  confirmed has not needed rescue inhaler hardly ever, not recently; confirmed using maintenance inhaler as prescribed; denies concerns around cough today- sounds to be in no  respiratory distress throughout [facility] call Respiratory Management Strategies: Adequate rest, Asthma action plan, Coping strategies, Medication therapy, Oxygen therapy, Weight management, Routine screening  Endocrine Endocrine Symptoms Reported: No symptoms reported Is patient diabetic?: No     Gastrointestinal Gastrointestinal Symptoms Reported: No symptoms reported Additional Gastrointestinal Details: Good appetite; pooping normally reports last bowel movement earlier today      Genitourinary Genitourinary Symptoms Reported: No symptoms reported Additional Genitourinary Details: Confirmed taking prescribed diuretic as instructed    Integumentary Integumentary Symptoms Reported: No symptoms reported    Musculoskeletal Additional Musculoskeletal Details: confirmed not currently requiring/ using assistive devices for ambulation; confirmed no new or recent falls post- recent hospital discharge 09/07/24     Fall risk Follow up: Falls prevention discussed  Psychosocial Psychosocial Symptoms Reported: No symptoms reported         Today's Vitals   09/21/24 1300  Weight: 205 lb (93 kg)      Medications Reviewed Today     Reviewed by Bettyanne Dittman M, RN (Registered Nurse) on 09/21/24 at 1303  Med List Status: <None>   Medication Order Taking? Sig Documenting Provider Last Dose Status Informant  albuterol  (VENTOLIN  HFA) 108 (90 Base) MCG/ACT inhaler [id number]  Inhale 2 puffs into the lungs every 6 (six) hours as needed for wheezing or shortness of breath. Odell Celinda Balo, MD  Active   aspirin EC 81 MG tablet [id number]  Take 81 mg by mouth daily. Swallow whole. [provider]  Active Self, Pharmacy Records  azelastine  (ASTELIN ) 0.1 % nasal spray [id number]  Place 1 spray into both nostrils 2 (two) times daily. Use in each nostril as directed  Patient not taking: Reported on 09/19/2024   Hope Almarie ORN, NP  Active   budesonide -glycopyrrolate -formoterol  (BREZTRI  AEROSPHERE) 160-9-4.8 MCG/ACT AERO inhaler [id number]  Inhale 2 puffs into the lungs in the morning and at bedtime. Odell Celinda Balo, MD  Active   Calcium-Magnesium -Vitamin D  600-40-500 MG-MG-UNIT TB24 [id number]  Take 1 tablet by mouth daily. [provider]  Active Self, Pharmacy Records   Cholecalciferol (VITAMIN D -3) 1000 UNITS CAPS [id number]  Take 1 capsule by mouth daily. [provider]  Active Self, Pharmacy Records  diazepam  (VALIUM ) 5 MG tablet [id number]  Take 1 tablet (5 mg total) by mouth daily as needed for anxiety. Rollene Almarie LABOR, MD  Active Self, Pharmacy Records           Med Note CARLEEN, [place] D   Mon Aug 29, 2024  1:18 PM) Has at home - hasn't had to use in a while.  furosemide  (LASIX ) 40 MG tablet [id number]  Take 1 tablet (40 mg total) by mouth daily. Court Dorn PARAS, MD  Active Self, Pharmacy Records  Multiple Vitamins-Minerals (CENTRUM SILVER) tablet [id number]  Take 1 tablet by mouth daily. [provider]  Active Self, Pharmacy Records  nicotine  (NICODERM CQ  - DOSED IN MG/24 HOURS) 21 mg/24hr patch [id number]  Place 1 patch (21 mg total) onto the skin daily. Hope Almarie ORN, NP  Active   promethazine -dextromethorphan  (PROMETHAZINE -DM) 6.25-15 MG/5ML syrup [id number]  Take 5 mLs by mouth 4 (four) times daily as needed.  Patient not taking: Reported on 09/19/2024   Hope Almarie ORN, NP  Active   spironolactone  (ALDACTONE ) 25 MG tablet [id number]  Take 1 tablet (25 mg total) by mouth daily. Goodrich, Callie E, PA-C  Active Self, Pharmacy Records  vitamin B-12 (CYANOCOBALAMIN ) 1000 MCG tablet [id number]  Take 1,000 mcg by mouth daily. [provider]  Active Self, Pharmacy  Records  vitamin C (ASCORBIC ACID) 500 MG tablet [id number]  Take 500 mg by mouth daily. [provider]  Active Self, Pharmacy Records           Recommendation:   Specialty provider follow-up: pulmonary provider 10/26/24 and/ or 11/02/24- as scheduled Continue Current Plan of Care  Follow Up Plan:   Telephone follow-up in 1 week- as scheduled 09/28/24  I appreciate the opportunity to participate in Bre[place]'s care,  Pls call/ message for questions,  Beatris Blinda Lawrence, RN, BSN, CCRN Alumnus RN Care Manager  Transitions of Care  [facility]BC[facility] -  [facility] [phone]: direct office     "

## 2024-09-23 ENCOUNTER — Telehealth: Payer: Self-pay | Admitting: Pulmonary Disease

## 2024-09-23 NOTE — Telephone Encounter (Signed)
 Copied from CRM #[id number]. Topic: Appointments - Transfer of Care >> Sep 21, 2024  1:47 PM Stacey [name] wrote: Pt is requesting to transfer FROM: Dr. Neda Pt is requesting to transfer TO: Dr. Catherine Reason for requested transfer: Patients feel that they prefer Dr. Alois bedside manner and are very impressed with hm overall.  It is the responsibility of the team the patient would like to transfer to (Dr. Catherine) to reach out to the patient if for any reason this transfer is not acceptable.  Patient is requesting a call to inform her of decision and cancel appointments not needed for for decision.

## 2024-09-28 ENCOUNTER — Telehealth: Payer: Self-pay

## 2024-09-28 ENCOUNTER — Other Ambulatory Visit: Payer: Self-pay | Admitting: *Deleted

## 2024-09-28 ENCOUNTER — Encounter: Payer: Self-pay | Admitting: Family Medicine

## 2024-09-28 MED ORDER — PROMETHAZINE-DM 6.25-15 MG/5ML PO SYRP: 5.0000 mL | ORAL_SOLUTION | Freq: Four times a day (QID) | ORAL | 0 refills | Status: DC | PRN

## 2024-09-28 NOTE — Telephone Encounter (Signed)
" °  Spoke with Marval from [place] they are calling patient and  putting her on the RRT schedule   -NFN    Copied from CRM [id number]. Topic: Clinical - Prescription Issue >> Sep 26, 2024  3:25 PM Corean SAUNDERS wrote: Reason for CRM: Monico with [employer] at [facility] states the patient contacted her seeking an update on her POC order but she states [employer] did not receive the order or referral for a POC. "

## 2024-09-28 NOTE — Patient Instructions (Signed)
 Visit Information  Thank you for taking time to visit with me today. Please don't hesitate to contact me if I can be of assistance to you before our next scheduled telephone appointment.  Our next appointment is by telephone on Wednesday, October 05, 2024 at 1:00 pm  Please call the care guide team at [phone] if you need to cancel or reschedule your appointment.   Following are the goals we discussed today:  Patient Self Care Activities:  Attend all scheduled provider appointments Call provider office for new concerns or questions  Participate in Transition of Care Program/Attend TOC scheduled calls Take medications as prescribed   eliminate smoking in my home eliminate symptom triggers at home follow rescue plan if symptoms flare-up Continue pacing activity to avoid episodes of shortness of breath Continue to follow your established action plan for episodes of shortness of breath Continue using home oxygen as prescribed/ instructed Please consider weighing yourself every day to stay on top of early fluid retention: write down your weights every day so you remember what it is from day to day: follow the weight-gain guidelines and action plan to call your doctor if you gain more than 3 lbs overnight, or 5 lbs in one week If you believe your condition is getting worse- contact your care providers (doctors) promptly- reaching out to your doctor early when you have concerns can prevent you from having to go to the hospital  If you are experiencing a Mental Health or Behavioral Health Crisis or need someone to talk to, please  call the Suicide and Crisis Lifeline: 988 call the [place]  National Suicide Prevention Lifeline: (848)214-7796 or TTY: (743) 732-6260 TTY 667-846-0642) to talk to a trained counselor call 1-800-273-TALK (toll free, 24 hour hotline) go to [facility] [address], [place] [phone]) call the [facility] Crisis Line:  [phone] call 911   Care plan and visit instructions communicated with the patient verbally today. Patient agrees to receive a copy in MyChart. Active MyChart status and patient understanding of how to access instructions and care plan via MyChart confirmed with patient.     Pls call/ message for questions,  Nyliah Nierenberg Mckinney Rasheeda Mulvehill, RN, BSN, CCRN Alumnus RN Care Manager  Transitions of Care  [facility] - [facility] [phone]: direct office

## 2024-09-28 NOTE — Transitions of Care (Post Inpatient/ED Visit) (Signed)
 " Transition of Care week 4/ day # 21  Visit Note  09/28/2024  Name: Stacey [name] MRN: [id number]          DOB: 1955-12-28  Situation: Patient enrolled in [facility] 30-day program. Visit completed with patient by telephone.   HIPAA identifiers x 2 verified  Background:  Recent hospitalization December 8-17, 2025 for cough/ SOB/ acute on chronic respiratory failure in setting of COPD: new home O2 requirement- order Independent in self care: drives self at baseline; supportive spouse who is able to assist with needs; no assistive devices (1) unplanned hospital admission x last (6)/ (12) months  Initial Transition Care Management Follow-up Telephone Call Discharge Date and Diagnosis: 09/07/24, Acute on chronic respiratory failure with hypoxia; COPD exacerbation   Past Medical History:  Diagnosis Date   Aortic atherosclerosis    Breast cancer (HCC) 09/22/2006   L side, s/p lumpectomy, chemotherapy and radiation   Centrilobular emphysema (HCC)    Chronic combined systolic and diastolic heart failure (HCC)    Edema    Leg swelling    OSA (obstructive sleep apnea)    Panic disorder    SOB (shortness of breath)    Tobacco abuse    Assessment:  I am still doing better-- using my home oxygen less and less during the day; and the oxygen levels are staying up- but when I lie down, they tend to decrease back into the high 80's- so I am using it all the time at night- and once it is on-- the levels go up to the mid- high 90's then.  I have not needed the rescue inhaler still using the maintenance inhaler as prescribed; one time this week, I had a weight gain of 3 lbs overnight and I took an extra half-dose of the fluid pill and that took care of it- I am back down to my normal weight now; look forward to going to the new pulmonary doctor in early February- still working on getting the portable lightweight oxygen tank  Reports continues to work on smoking cessation: reports has decreased from 30  cigarettes a day to 1-3; Provided additional encouragement/ support and resources, including smoking cessation program- she will consider and discuss with pulmonary provider a upcoming appointment  Denies clinical concerns and sounds to be in no distress throughout TOC 30-day program outreach call today  Patient Reported Symptoms: Cognitive Cognitive Status: Normal speech and language skills, Alert and oriented to person, place, and time, Insightful and able to interpret abstract concepts Cognitive/Intellectual Conditions Management [RPT]: None reported or documented in medical history or problem list      Neurological Neurological Review of Symptoms: No symptoms reported    HEENT HEENT Symptoms Reported: No symptoms reported      Cardiovascular Cardiovascular Symptoms Reported: No symptoms reported Other Cardiovascular Symptoms: Confirms continues to monitor daily weights at home Does patient have uncontrolled Hypertension?: No Cardiovascular Management Strategies: Routine screening, Medication therapy, Coping strategies, Adequate rest, Weight management, Diet modification Do You Have a Working Readable Scale?: Yes Weight: 206 lb (93.4 kg) (home reported weight from 09/28/24)  Respiratory Respiratory Symptoms Reported: No symptoms reported Other Respiratory Symptoms: Reports continues using home oxygen less and less during the day; Continues using at 1-half L/min; use it all the time at night; reports using only about 20% of the time during the day- don't need it has not needed rescue inhaler- continues using maintenance inhaler as prescribed; denies cough/ shortness of breath during TOC call and sounds to be  in no distress throughout call; reports one this week, I had a weight gain of 3 lbs overnight and I took an extra half-dose of the fluid pill and that took care of it- I am back down to my normal weight now; Reports continues to work on smoking cessation: reports has decreased from  30 cigarettes a day to 1-3; Provided additional encouragement/ support and resources around smoking cessation Respiratory Management Strategies: Weight management, Oxygen therapy, Coping strategies, Adequate rest, Asthma action plan, Medication therapy, Routine screening  Endocrine Endocrine Symptoms Reported: No symptoms reported Is patient diabetic?: No    Gastrointestinal Gastrointestinal Symptoms Reported: No symptoms reported Additional Gastrointestinal Details: Good appetite and having normal and regular BM's  Reports last BM earlier this morning      Genitourinary Genitourinary Symptoms Reported: No symptoms reported Additional Genitourinary Details: Pee is normal- clear; confirmed taking diuretic as prescribed    Integumentary Integumentary Symptoms Reported: No symptoms reported    Musculoskeletal Musculoskelatal Symptoms Reviewed: No symptoms reported        Psychosocial Psychosocial Symptoms Reported: No symptoms reported         Today's Vitals   09/28/24 1300  SpO2: 93%  Weight: 206 lb (93.4 kg)      Medications Reviewed Today     Reviewed by Santoria Chason M, RN (Registered Nurse) on 09/28/24 at 1301  Med List Status: <None>   Medication Order Taking? Sig Documenting Provider Last Dose Status Informant  albuterol  (VENTOLIN  HFA) 108 (90 Base) MCG/ACT inhaler [id number]  Inhale 2 puffs into the lungs every 6 (six) hours as needed for wheezing or shortness of breath. Odell Celinda Balo, MD  Active   aspirin EC 81 MG tablet [id number]  Take 81 mg by mouth daily. Swallow whole. [provider]  Active Self, Pharmacy Records  azelastine  (ASTELIN ) 0.1 % nasal spray [id number]  Place 1 spray into both nostrils 2 (two) times daily. Use in each nostril as directed  Patient not taking: Reported on 09/19/2024   Hope Almarie ORN, NP  Active   budesonide -glycopyrrolate -formoterol  (BREZTRI  AEROSPHERE) 160-9-4.8 MCG/ACT AERO inhaler [id number]  Inhale 2 puffs into  the lungs in the morning and at bedtime. Odell Celinda Balo, MD  Active   Calcium-Magnesium -Vitamin D  600-40-500 MG-MG-UNIT TB24 [id number]  Take 1 tablet by mouth daily. [provider]  Active Self, Pharmacy Records  Cholecalciferol (VITAMIN D -3) 1000 UNITS CAPS [id number]  Take 1 capsule by mouth daily. [provider]  Active Self, Pharmacy Records  diazepam  (VALIUM ) 5 MG tablet [id number]  Take 1 tablet (5 mg total) by mouth daily as needed for anxiety. Rollene Almarie LABOR, MD  Active Self, Pharmacy Records           Med Note CARLEEN, [place] D   Mon Aug 29, 2024  1:18 PM) Has at home - hasn't had to use in a while.  furosemide  (LASIX ) 40 MG tablet [id number]  Take 1 tablet (40 mg total) by mouth daily. Court Dorn PARAS, MD  Active Self, Pharmacy Records  Multiple Vitamins-Minerals (CENTRUM SILVER) tablet [id number]  Take 1 tablet by mouth daily. [provider]  Active Self, Pharmacy Records  nicotine  (NICODERM CQ  - DOSED IN MG/24 HOURS) 21 mg/24hr patch [id number]  Place 1 patch (21 mg total) onto the skin daily. Hope Almarie ORN, NP  Active   promethazine -dextromethorphan  (PROMETHAZINE -DM) 6.25-15 MG/5ML syrup [id number]  Take 5 mLs by mouth 4 (four) times daily as needed.  Patient not taking: Reported on 09/19/2024   Hope Almarie  W, NP  Active   spironolactone  (ALDACTONE ) 25 MG tablet [id number]  Take 1 tablet (25 mg total) by mouth daily. Goodrich, Callie E, PA-C  Active Self, Pharmacy Records  vitamin B-12 (CYANOCOBALAMIN ) 1000 MCG tablet [id number]  Take 1,000 mcg by mouth daily. [provider]  Active Self, Pharmacy Records  vitamin C (ASCORBIC ACID) 500 MG tablet [id number]  Take 500 mg by mouth daily. [provider]  Active Self, Pharmacy Records           Recommendation:   Specialty provider follow-up: pulmonary provider- as scheduled 10/26/24 Continue Current Plan of Care  Follow Up Plan:   Telephone follow-up in 1 week- as  scheduled 10/05/24  I appreciate the opportunity to participate in [place] care,  Pls call/ message for questions,  Beatris Blinda Lawrence, RN, BSN, CCRN Alumnus RN Care Manager  Transitions of Care  [facility] - [facility] [phone]: direct office     "

## 2024-09-29 NOTE — Telephone Encounter (Signed)
 Okay with me

## 2024-10-03 ENCOUNTER — Other Ambulatory Visit (HOSPITAL_COMMUNITY): Payer: Self-pay

## 2024-10-04 ENCOUNTER — Other Ambulatory Visit (HOSPITAL_COMMUNITY): Payer: Self-pay

## 2024-10-04 ENCOUNTER — Telehealth (HOSPITAL_BASED_OUTPATIENT_CLINIC_OR_DEPARTMENT_OTHER): Payer: Self-pay

## 2024-10-04 NOTE — Telephone Encounter (Signed)
 Copied from CRM [id number]. Topic: Clinical - Prescription Issue >> Oct 04, 2024  2:58 PM Corean SAUNDERS wrote: Reason for CRM: Patients husband states that he went to pick up patients budesonide -glycopyrrolate -formoterol  (BREZTRI  AEROSPHERE) 160-9-4.8 MCG/ACT AERO inhaler and the co-pay was $600 and he is wondering if there is anything Dr. Catherine  can to help. E2C2 agent advised him to contact insurance as well but wants to know if patient even still needs this inhaler.    [name] [phone] >> Oct 04, 2024  3:17 PM Russell PARAS wrote: Pt's husband is contacting clinic due to getting disconnected with previous agent. Advised husband message was sent to clinic regarding his issue. Would like to know if there are any alternate meds that would be cheaper. Pt is going to reach out to [employer] to get more information concerning the copay as well.  Husband requested call back  CB#  [phone]

## 2024-10-05 ENCOUNTER — Encounter: Payer: Self-pay | Admitting: *Deleted

## 2024-10-05 ENCOUNTER — Telehealth: Payer: Self-pay | Admitting: *Deleted

## 2024-10-06 ENCOUNTER — Other Ambulatory Visit (HOSPITAL_BASED_OUTPATIENT_CLINIC_OR_DEPARTMENT_OTHER): Payer: Self-pay

## 2024-10-06 ENCOUNTER — Encounter (HOSPITAL_BASED_OUTPATIENT_CLINIC_OR_DEPARTMENT_OTHER): Payer: Self-pay | Admitting: Pulmonary Disease

## 2024-10-06 ENCOUNTER — Other Ambulatory Visit: Payer: Self-pay | Admitting: *Deleted

## 2024-10-06 MED ORDER — BREZTRI AEROSPHERE 160-9-4.8 MCG/ACT IN AERO: 2.0000 | INHALATION_SPRAY | Freq: Two times a day (BID) | RESPIRATORY_TRACT | Status: DC

## 2024-10-06 NOTE — Patient Instructions (Signed)
 Visit Information  Thank you for taking time to visit with me today. Please don't hesitate to contact me if I can be of assistance to you before our next scheduled telephone appointment.  Our next appointment is by telephone on Thursday October 27, 2024 at 10:00 am  Please call the care guide team at [phone] if you need to cancel or reschedule your appointment.   Following are the goals we discussed today:   Patient Self Care Activities:  Attend all scheduled provider appointments Call provider office for new concerns or questions  Participate in Transition of Care Program/Attend TOC scheduled calls Take medications as prescribed   eliminate smoking in my home eliminate symptom triggers at home follow rescue plan if symptoms flare-up Continue pacing activity to avoid episodes of shortness of breath Continue to follow your established action plan for episodes of shortness of breath Continue using home oxygen as prescribed/ instructed Please consider weighing yourself every day to stay on top of early fluid retention: write down your weights every day so you remember what it is from day to day: follow the weight-gain guidelines and action plan to call your doctor if you gain more than 3 lbs overnight, or 5 lbs in one week If you believe your condition is getting worse- contact your care providers (doctors) promptly- reaching out to your doctor early when you have concerns can prevent you from having to go to the hospital  If you are experiencing a Mental Health or Behavioral Health Crisis or need someone to talk to, please  call the Suicide and Crisis Lifeline: 988 call the [place]  National Suicide Prevention Lifeline: (828) 132-3535 or TTY: 734-513-8953 TTY 334-272-9024) to talk to a trained counselor call 1-800-273-TALK (toll free, 24 hour hotline) go to [facility] [address] [place] [phone]) call the [facility] Crisis Line:  [phone] call 911   Care plan and visit instructions communicated with the patient verbally today. Patient agrees to receive a copy in MyChart. Active MyChart status and patient understanding of how to access instructions and care plan via MyChart confirmed with patient.     Pls call/ message for questions,  Lakhia Gengler Mckinney Alekzander Cardell, RN, BSN, CCRN Alumnus RN Care Manager  Transitions of Care  [facility] - [facility] [phone]: direct office

## 2024-10-06 NOTE — Addendum Note (Signed)
 Addended by: TRUDY WARREN CROME on: 10/06/2024 03:59 PM   Modules accepted: Orders

## 2024-10-06 NOTE — Transitions of Care (Post Inpatient/ED Visit) (Signed)
 " Transition of Care week # 5/ day # 28 TOC goals- successfully met  Visit Note  10/06/2024  Name: Stacey [name] MRN: [id number]          DOB: Feb 25, 1956  Situation: Patient enrolled in [facility] 30-day program. Visit completed with patient and her spouse Tim by telephone while phone on speaker mode  HIPAA identifiers x 2 verified  10/06/24:  Successful Outreach to patient for [facility] 30-day program case closure: goals met: Patient declined ongoing referral to [facility] longitudinal RN CM- however- she would like one more additional call from [facility] RN CM after her upcoming scheduled new pulmonary provider provider office visit on 10/26/24: TOC 30-day program kept open for now with one additional scheduled outreach from [facility] RN CM scheduled for 10/27/24- she will consider referral to [facility] longitudinal RN CM and update TOC nurse at that time  Background:  Recent hospitalization December 8-17, 2025 for cough/ SOB/ acute on chronic respiratory failure in setting of COPD: new home O2 requirement- order Independent in self care: drives self at baseline; supportive spouse who is able to assist with needs; no assistive devices (1) unplanned hospital admission x last (6)/ (12) months  Initial Transition Care Management Follow-up Telephone Call Discharge Date and Diagnosis: 09/07/24, Acute on chronic respiratory failure with hypoxia; COPD exacerbation   Past Medical History:  Diagnosis Date   Aortic atherosclerosis    Breast cancer (HCC) 09/22/2006   L side, s/p lumpectomy, chemotherapy and radiation   Centrilobular emphysema (HCC)    Chronic combined systolic and diastolic heart failure (HCC)    Edema    Leg swelling    OSA (obstructive sleep apnea)    Panic disorder    SOB (shortness of breath)    Tobacco abuse    Assessment:  I am still doing really good and using the home oxygen only at night now; no shortness of breath and no need for the rescue inhaler and my oxygen levels are staying in the mid-high 90's  with or without the oxygen on; have contacted the pulmonary doctor to try to get another maintenance inhaler, the one they prescribed is too expensive and am getting ready to call them now for an update- they are working on it, hoping they can change the inhaler to a more affordable one.  Don't really think we will qualify for assistance with the cost, so I am hoping it can just be switched to another medicine.  Still working on quitting smoking- and doing well- only having one or two cigarettes a day, so I am making good progress.  I don't really want to tell this story to another nurse right now-- but if you could call me back after pulmonary appointment on 10/26/24- I would really appreciate it, and I will know then if I need another nurse to call me after that.   Denies clinical concerns and sounds to be in no distress throughout [facility] 30-day program outreach call today   Patient Reported Symptoms: Cognitive Cognitive Status: Normal speech and language skills, Alert and oriented to person, place, and time, Insightful and able to interpret abstract concepts Cognitive/Intellectual Conditions Management [RPT]: None reported or documented in medical history or problem list      Neurological Neurological Review of Symptoms: No symptoms reported    HEENT HEENT Symptoms Reported: No symptoms reported      Cardiovascular Cardiovascular Symptoms Reported: No symptoms reported Other Cardiovascular Symptoms: Confirms continues to monitor daily weights at home: not currently near weights: reports  slow weight gain since hospital discharge and denies weight gain of 2-3 pounds overnight/ 5 pounds in one week Does patient have uncontrolled Hypertension?: No Cardiovascular Management Strategies: Routine screening, Medication therapy, Coping strategies, Adequate rest, Weight management, Diet modification  Respiratory Respiratory Symptoms Reported: No symptoms reported Other Respiratory Symptoms: I am still  doing really good and using the home oxygen only at night now; no shortness of breath and no need for the rescue inhaler and my oxygen levels are staying in the mid-high 90's with or without the oxygen on; have contacted the pulmonary doctor to try to get another maintenance inhaler, the one they prescribed is too expensive and am getting ready to call them now for an update- they are working on it, hoping they can change the inhaler to a more affordable one.  Don't really think we will qualify for assistance with the cost, so I am hoping it can just be switched to another medicine.  Still working on quitting smoking- and doing well- only having one or two cigarettes a day, so I am making good progress Confirmed plans to attend scheduled pulmonary provider appointment 10/26/24 Respiratory Management Strategies: Adequate rest, Asthma action plan, Breathing exercise, Coping strategies, Medication therapy, Weight management, Oxygen therapy, Routine screening  Endocrine Endocrine Symptoms Reported: No symptoms reported Is patient diabetic?: No    Gastrointestinal Gastrointestinal Symptoms Reported: No symptoms reported      Genitourinary Genitourinary Symptoms Reported: No symptoms reported    Integumentary Integumentary Symptoms Reported: No symptoms reported    Musculoskeletal Musculoskelatal Symptoms Reviewed: No symptoms reported Additional Musculoskeletal Details: confirmed still not currently requiring/ using assistive devices for ambulation as per baseline        Psychosocial Psychosocial Symptoms Reported: No symptoms reported         There were no vitals filed for this visit. Pain Scale: 0-10 Pain Score: 0-No pain  Medications Reviewed Today     Reviewed by Nalin Mazzocco M, RN (Registered Nurse) on 10/06/24 at 1023  Med List Status: <None>   Medication Order Taking? Sig Documenting Provider Last Dose Status Informant  albuterol  (VENTOLIN  HFA) 108 (90 Base) MCG/ACT inhaler [id number]   Inhale 2 puffs into the lungs every 6 (six) hours as needed for wheezing or shortness of breath. Odell Celinda Balo, MD  Active   aspirin EC 81 MG tablet [id number]  Take 81 mg by mouth daily. Swallow whole. [provider]  Active Self, Pharmacy Records  azelastine  (ASTELIN ) 0.1 % nasal spray [id number]  Place 1 spray into both nostrils 2 (two) times daily. Use in each nostril as directed  Patient not taking: Reported on 09/19/2024   Hope Almarie ORN, NP  Active   budesonide -glycopyrrolate -formoterol  (BREZTRI  AEROSPHERE) 160-9-4.8 MCG/ACT AERO inhaler [id number]  Inhale 2 puffs into the lungs in the morning and at bedtime. Odell Celinda Balo, MD  Active   Calcium-Magnesium -Vitamin D  600-40-500 MG-MG-UNIT TB24 [id number]  Take 1 tablet by mouth daily. [provider]  Active Self, Pharmacy Records  Cholecalciferol (VITAMIN D -3) 1000 UNITS CAPS [id number]  Take 1 capsule by mouth daily. [provider]  Active Self, Pharmacy Records  diazepam  (VALIUM ) 5 MG tablet [id number]  Take 1 tablet (5 mg total) by mouth daily as needed for anxiety. Rollene Almarie LABOR, MD  Active Self, Pharmacy Records           Med Note CARLEEN, [place] D   Mon Aug 29, 2024  1:18 PM) Has at home - hasn't had to use in a  while.  furosemide  (LASIX ) 40 MG tablet [id number]  Take 1 tablet (40 mg total) by mouth daily. Court Dorn PARAS, MD  Active Self, Pharmacy Records  Multiple Vitamins-Minerals (CENTRUM SILVER) tablet [id number]  Take 1 tablet by mouth daily. [provider]  Active Self, Pharmacy Records  nicotine  (NICODERM CQ  - DOSED IN MG/24 HOURS) 21 mg/24hr patch [id number]  Place 1 patch (21 mg total) onto the skin daily. Hope Almarie ORN, NP  Active   promethazine -dextromethorphan  (PROMETHAZINE -DM) 6.25-15 MG/5ML syrup [id number]  Take 5 mLs by mouth 4 (four) times daily as needed. Alvia Corean CROME, FNP  Active   spironolactone  (ALDACTONE ) 25 MG tablet [id number]  Take 1 tablet (25  mg total) by mouth daily. Goodrich, Callie E, PA-C  Active Self, Pharmacy Records  vitamin B-12 (CYANOCOBALAMIN ) 1000 MCG tablet [id number]  Take 1,000 mcg by mouth daily. [provider]  Active Self, Pharmacy Records  vitamin C (ASCORBIC ACID) 500 MG tablet [id number]  Take 500 mg by mouth daily. [provider]  Active Self, Pharmacy Records           Recommendation:   Specialty provider follow-up: as scheduled 10/26/24 Continue Current Plan of Care  Follow Up Plan:   Telephone follow-up as scheduled 10/27/24 for one additional TOC outreach post- scheduled pulmonary provider office visit on 10/26/24: patient will decide then if she would like referral to [facility] longitudinal RN CM  Pls call/ message for questions,  Beatris Blinda Lawrence, RN, BSN, CCRN Alumnus RN Care Manager  Transitions of Care  [facility] - [facility] [phone]: direct office     "

## 2024-10-07 ENCOUNTER — Other Ambulatory Visit (HOSPITAL_COMMUNITY): Payer: Self-pay

## 2024-10-07 ENCOUNTER — Telehealth (HOSPITAL_BASED_OUTPATIENT_CLINIC_OR_DEPARTMENT_OTHER): Payer: Self-pay

## 2024-10-07 NOTE — Telephone Encounter (Signed)
 Trelegy- $620.61 Breztri - $617.47  *patient has a deductible to meet causing these higher prices

## 2024-10-08 NOTE — Telephone Encounter (Signed)
 Inhaler pricing, I gave pt some samples to last until she sees you on the 4th so you can discuss with her.

## 2024-10-08 NOTE — Telephone Encounter (Signed)
 Sounds like a good plan

## 2024-10-19 ENCOUNTER — Telehealth: Payer: Self-pay

## 2024-10-19 ENCOUNTER — Ambulatory Visit: Payer: Self-pay | Admitting: Internal Medicine

## 2024-10-19 ENCOUNTER — Other Ambulatory Visit: Payer: Self-pay | Admitting: Family Medicine

## 2024-10-19 ENCOUNTER — Encounter: Payer: Self-pay | Admitting: Family Medicine

## 2024-10-19 DIAGNOSIS — R053 Chronic cough: Secondary | ICD-10-CM

## 2024-10-19 MED ORDER — PROMETHAZINE-DM 6.25-15 MG/5ML PO SYRP: 5.0000 mL | ORAL_SOLUTION | Freq: Four times a day (QID) | ORAL | 0 refills | Status: DC | PRN

## 2024-10-19 NOTE — Telephone Encounter (Signed)
 Copied from CRM [id number]. Topic: Clinical - Medication Refill >> Oct 19, 2024  1:15 PM Leah C wrote: Medication: promethazine -dextromethorphan  (PROMETHAZINE -DM) 6.25-15 MG/5ML syrup.  [phone] (M). Patient would like a call to know if it's okay to be refilled and when it has been sent in.   Has the patient contacted their pharmacy? Yes (Agent: If no, request that the patient contact the pharmacy for the refill. If patient does not wish to contact the pharmacy document the reason why and proceed with request.) (Agent: If yes, when and what did the pharmacy advise?)  This is the patient's preferred pharmacy:  [facility] [place], [place] - [address] [place] [address] Phone: [phone] Fax: [phone]  Is this the correct pharmacy for this prescription? Yes If no, delete pharmacy and type the correct one.   Has the prescription been filled recently? Yes  Is the patient out of the medication? Yes  Has the patient been seen for an appointment in the last year OR does the patient have an upcoming appointment? Yes  Can we respond through MyChart? Yes   Agent: Please be advised that Rx refills may take up to 3 business days. We ask that you follow-up with your pharmacy.

## 2024-10-19 NOTE — Telephone Encounter (Signed)
 Previously addressed.

## 2024-10-19 NOTE — Telephone Encounter (Signed)
 Refilled and addressed separately

## 2024-10-19 NOTE — Telephone Encounter (Signed)
 FYI Only or Action Required?: Action required by provider: medication refill request. promethazine -dextromethorphan  (PROMETHAZINE -DM) 6.25-15 MG/5ML syrup. Pt declines appt.   Patient is followed in Pulmonology for Obstructive lung disease, chronic hypoxic respiratory failure last seen on 09/12/2024 by Hope Almarie ORN, NP.  Called Nurse Triage reporting Cough.  Symptoms began about a month ago.  Interventions attempted: OTC medications: Delsym  and Prescription medications: Saline nasal spray, promethazine .  Symptoms are: gradually worsening.  Triage Disposition: See PCP When Office is Open (Within 3 Days)  Patient/caregiver understands and will follow disposition?: No, refuses disposition    Severe cough since coming home from hospital Christmas 2025. Difficulty clearing mucus. Has been clear when coughed up. Cough worse when wearing O2 at night (1L). Severe SOB only when coughing, lasts a few seconds. No CP. No fever.  Advised to be seen in next 3 days. Declines. Only wanting to see Dr. Catherine at this time, already has soonest appt scheduled for 2/4.  Asking if promethazine -dextromethorphan  (PROMETHAZINE -DM) 6.25-15 MG/5ML syrup can be refilled to get her through until appt on 2/4. Last prescribed by PCP office.   Offered to schedule with PCP office. Declines. Would like to know if this can be called in. Forwarding request to PCP office. Refill request attached. Advised UC or ED for worsening symptoms.     [facility] Pulmonary Triage - Initial Assessment Questions Chief Complaint (e.g., cough, sob, wheezing, fever, chills, sweat or additional symptoms) *Go to specific symptom protocol after initial questions. Severe cough with congestion  How long have symptoms been present? Christmas 2025  Have you tested for COVID or Flu? Note: If not, ask patient if a home test can be taken. If so, instruct patient to call back for positive results. No  MEDICINES:   Have you used any OTC  meds to help with symptoms? Yes If yes, ask What medications? Delsym   Have you used your inhalers/maintenance medication? Yes If yes, What medications? Betzri   If inhaler, ask How many puffs and how often? Note: Review instructions on medication in the chart. 2 puffs 2x/day  OXYGEN: Do you wear supplemental oxygen? Yes If yes, How many liters are you supposed to use? 1L at night only  Do you monitor your oxygen levels? Yes If yes, What is your reading (oxygen level) today? 93-97% on RA during the day  What is your usual oxygen saturation reading?  (Note: Pulmonary O2 sats should be 90% or greater) 93-97% on RA during the day     Message from [place] R sent at 10/19/2024 12:47 PM EST  Reason for Triage: Worsening cough, concerned it may be from the supplemental oxygen. Patiens souse Tim is speaking   Reason for Disposition  Cough has been present for > 3 weeks  Answer Assessment - Initial Assessment Questions 1. ONSET: When did the cough begin?      Since coming home from hospital around Christmas 2025  2. SEVERITY: How bad is the cough today?      Severe  3. SPUTUM: Describe the color of your sputum (e.g., none, dry cough; clear, white, yellow, green)     Clear, scant  4. HEMOPTYSIS: Are you coughing up any blood? If Yes, ask: How much? (e.g., flecks, streaks, tablespoons, etc.)     No  5. DIFFICULTY BREATHING: Are you having difficulty breathing? If Yes, ask: How bad is it? (e.g., mild, moderate, severe)      Severe when coughing, breif  6. FEVER: Do you have a fever? If Yes, ask:  What is your temperature, how was it measured, and when did it start?     No  7. CARDIAC HISTORY: Do you have any history of heart disease? (e.g., heart attack, congestive heart failure)      CHF and pulm htn  8. LUNG HISTORY: Do you have any history of lung disease?  (e.g., pulmonary embolus, asthma, emphysema)     Obstructive lung disease,  chronic hypoxic respiratory failure  9. PE RISK FACTORS: Do you have a history of blood clots? (or: recent major surgery, recent prolonged travel, bedridden)     No  10. OTHER SYMPTOMS: Do you have any other symptoms? (e.g., runny nose, wheezing, chest pain)       Sore throat  Protocols used: Cough - Acute Productive-A-AH

## 2024-10-26 ENCOUNTER — Encounter (HOSPITAL_BASED_OUTPATIENT_CLINIC_OR_DEPARTMENT_OTHER): Payer: Self-pay | Admitting: Pulmonary Disease

## 2024-10-26 ENCOUNTER — Ambulatory Visit (HOSPITAL_BASED_OUTPATIENT_CLINIC_OR_DEPARTMENT_OTHER): Admitting: Pulmonary Disease

## 2024-10-26 VITALS — BP 126/79 | HR 113 | Ht 68.0 in | Wt 216.2 lb

## 2024-10-26 DIAGNOSIS — J449 Chronic obstructive pulmonary disease, unspecified: Secondary | ICD-10-CM

## 2024-10-26 DIAGNOSIS — Z8709 Personal history of other diseases of the respiratory system: Secondary | ICD-10-CM

## 2024-10-26 DIAGNOSIS — J9612 Chronic respiratory failure with hypercapnia: Secondary | ICD-10-CM | POA: Diagnosis not present

## 2024-10-26 DIAGNOSIS — Z122 Encounter for screening for malignant neoplasm of respiratory organs: Secondary | ICD-10-CM

## 2024-10-26 DIAGNOSIS — R053 Chronic cough: Secondary | ICD-10-CM | POA: Diagnosis not present

## 2024-10-26 DIAGNOSIS — G4733 Obstructive sleep apnea (adult) (pediatric): Secondary | ICD-10-CM

## 2024-10-26 DIAGNOSIS — F17210 Nicotine dependence, cigarettes, uncomplicated: Secondary | ICD-10-CM | POA: Diagnosis not present

## 2024-10-26 DIAGNOSIS — J9611 Chronic respiratory failure with hypoxia: Secondary | ICD-10-CM | POA: Diagnosis not present

## 2024-10-26 DIAGNOSIS — J9383 Other pneumothorax: Secondary | ICD-10-CM

## 2024-10-26 MED ORDER — BREZTRI AEROSPHERE 160-9-4.8 MCG/ACT IN AERO: 2.0000 | INHALATION_SPRAY | Freq: Two times a day (BID) | RESPIRATORY_TRACT | 6 refills | Status: AC

## 2024-10-26 MED ORDER — PROMETHAZINE-DM 6.25-15 MG/5ML PO SYRP: 5.0000 mL | ORAL_SOLUTION | Freq: Four times a day (QID) | ORAL | 3 refills | Status: AC | PRN

## 2024-10-26 NOTE — Progress Notes (Unsigned)
 "  Established Patient Pulmonology Office Visit   Subjective:  Patient ID: Stacey [name], female    DOB: 02-11-1956  MRN: [id number]  CC:  Chief Complaint  Patient presents with   Follow-up    Chronic respiratory failure    HPI  Discussed the use of AI scribe software for clinical note transcription with the patient, who gave verbal consent to proceed.  History of Present Illness Stacey [name] is a 69 year old female with COPD who presents for follow-up regarding her oxygen use and respiratory symptoms.  She is no longer using oxygen during the daytime and monitors her oxygen saturation with a pulse oximeter, which mostly reads between 94 to 97%. Previously, she was on a significant amount of oxygen in the hospital but is now on one liter at home, primarily at night. Attempts to reduce the oxygen below one liter at night resulted in a low flow rate alarm on the concentrator.  She has a history of a resolved secondary spontaneous pneumothorax. She experiences significant coughing, described as 'gravelly' and 'cracking,' which sometimes leads to severe coughing spells that make her feel like she might vomit. The cough occasionally requires her to use oxygen temporarily. She has difficulty expectorating phlegm, which is clear when she can bring it up. She is currently using Breztri  inhaler, two puffs in the morning and evening, and rinses her mouth after use to prevent thrush. She also uses a prescription cough medicine at night to help her sleep.  She smokes up to five cigarettes a day, which is a reduction from her previous habit. She has undergone lung cancer screening with scans in the hospital, which were clear, and she is due for another scan in December 2026. She is concerned about throat cancer but denies hoarseness or difficulty swallowing.  She is on Lasix  40 mg daily and spironolactone  25 mg daily for concerns about congestive heart failure, with recent blood work in December  showing normal results. She has gained ten pounds since Christmas and is concerned about potential heart failure recurrence.   ROS   Current Medications[1]      Objective:  BP 126/79   Pulse (!) 113   Ht 5' 8 (1.727 m)   Wt 216 lb 3.2 oz (98.1 kg)   SpO2 94%   BMI 32.87 kg/m  Wt Readings from Last 3 Encounters:  10/26/24 216 lb 3.2 oz (98.1 kg)  09/28/24 206 lb (93.4 kg)  09/21/24 205 lb (93 kg)   BMI Readings from Last 3 Encounters:  10/26/24 32.87 kg/m  09/28/24 31.32 kg/m  09/21/24 31.17 kg/m   SpO2 Readings from Last 3 Encounters:  10/26/24 94%  09/28/24 93%  09/19/24 96%    Physical Exam General: NAD, alert, WD, WN Eyes: PERRL, no scleral icterus ENMT: oropharynx clear, good dentition, no oral lesions, mallampati score IV Skin: warm, intact, no rashes Neck: JVD flat, ROM and lymph node assessment normal CV: RRR, no MRG, nl S1 and S2, no peripheral edema Resp: clear to auscultation bilaterally, no wheezes, rales, or rhonchi, normal effort, no clubbing/cyanosis Neuro: Awake alert oriented to person place time and situation  Diagnostic Review:  Last CBC Lab Results  Component Value Date   WBC 7.7 09/19/2024   HGB 15.0 09/19/2024   HCT 44.9 09/19/2024   MCV 97.0 09/19/2024   MCH 32.5 09/04/2024   RDW 13.3 09/19/2024   PLT 176.0 09/19/2024   Last metabolic panel Lab Results  Component Value Date   GLUCOSE 105 (H)  09/19/2024   NA 141 09/19/2024   K 3.9 09/19/2024   CL 98 09/19/2024   CO2 37 (H) 09/19/2024   BUN 14 09/19/2024   CREATININE 0.74 09/19/2024   GFR 82.40 09/19/2024   CALCIUM 9.2 09/19/2024   PHOS 4.6 08/29/2024   PROT 6.5 09/19/2024   ALBUMIN 3.7 09/19/2024   BILITOT 0.7 09/19/2024   ALKPHOS 54 09/19/2024   AST 25 09/19/2024   ALT 39 (H) 09/19/2024   ANIONGAP 12 09/04/2024   CT chest 09/06/2024: IMPRESSION: 1. No acute cardiopulmonary disease. Previously seen left-sided pneumothorax is not visualized. 2. Atelectatic change  over the right middle lobe, right lower lobe, left lower lobe and lingula. 3. Aortic atherosclerosis. Atherosclerotic coronary artery disease. 4. Few small liver hypodensities unchanged and likely cysts, but too small to characterize. Stable partially visualized cyst over the upper pole left kidney.  CXR 09/19/2024: IMPRESSION: 1. Improved aeration of the left lung base, with likely chronic left basilar pleural thickening and atelectasis.  PSG 05/19/2024: moderate OSA with AHI 24.3 with significant O2 desaturation to 77%. Needed 1 L O2 by [place] at night.  PFT not done yet  Absolute Eos Count: range 0 - 100 cells/uL    Assessment & Plan:   Assessment & Plan Chronic obstructive pulmonary disease, unspecified COPD type (HCC) Chronic obstructive pulmonary disease COPD with persistent cough and mucus production. Continues to smoke, exacerbating symptoms. Prefers Breztri  inhaler despite cost due to efficacy. MMRC > 2, CAT > 10, 1 exacerbation requiring hospitalization. Class E, unknown grade but will update PFTs with next visit. Will also refer to pulmonary rehab after PFT is completed. - Continue Breztri  inhaler, 2 puffs in the morning and 2 puffs in the evening. - Rinse mouth after using inhaler to prevent thrush. - Encouraged smoking cessation to reduce COPD symptoms. - Ordered pulmonary function test - Smoking limits further COPD medications such as chronic macrolides, roflumilast and biologics. Spontaneous pneumothorax Resolved based on imaging performed in the hospital. CXR ordered to ensure no recurrence. Encounter for screening for lung cancer The patient is a current cigarette smoker and has a pack year history of 50 pack years. They are currently asymptomatic and between the ages of 31 and 23 years old. They meet the criteria for a low dose CT scan can for lung cancer screening. They were counseled regarding the necessity for lung cancer screening and agreeable to have this imaging  performed. Referred to lung cancer screening program. Cigarette nicotine  dependence without complication Cigarette nicotine  dependence Continues to smoke, though significantly reduced. Acknowledges need to quit to improve health. - Encouraged smoking cessation. - Discussed potential benefits of quitting smoking, including reduced risk of lung cancer and improved COPD symptoms.  Smoking/Tobacco Cessation Counseling Akylah Hascall is a current user of tobacco or nicotine  products. She is considering quitting at this time. Counseling provided today addressed the risks of continued use and the benefits of cessation. Discussed tobacco/nicotine  use history, readiness to quit, and evidence-based treatment options including behavioral strategies, support resources, and pharmacologic therapies. Provided encouragement and educational materials on steps and resources to quit smoking. Patient questions were addressed, and follow-up recommended for continued support. Total time spent on counseling: 5 minutes.  Chronic cough Refilled promethazine  DM. Chronic hypoxic respiratory failure (HCC) Chronic hypoxic respiratory failure Improved oxygenation. No longer requires daytime oxygen as walk test negative in the office. Nighttime oxygen use under evaluation. - Continue to monitor oxygen saturation during sleep. - Proceed with scheduled sleep study to evaluate need for  CPAP or BiPAP. - Continue 1 L O2 by  at bedtime Chronic hypercapnic respiratory failure (HCC) Will need repeat ABG with PFTs to confirm as an OP. Likely to require BiPAP long term given chronic hypercapnia and OSA. Moderate obstructive sleep apnea Obstructive sleep apnea Moderate obstructive sleep apnea. Previous sleep study limited due to external noise and inability to sleep in lab. - Proceed with CPAP titration study in 11/2024 - Continue 1 L O2 at bedtime for now - Evaluate results to determine need for CPAP or BiPAP. Likely a better  candidate for BiPAP due to chronic hypercapnia.  Orders Placed This Encounter  Procedures   DG Chest 2 View   Ambulatory Referral for Lung Cancer Scre   Pulmonary function test   I spent 40 minutes reviewing patient's chart including prior consultant notes, imaging, and PFTs as well as face-to-face with the patient, over half in discussion of the diagnosis and the importance of compliance with the treatment plan.  Return in about 3 months (around 01/23/2025).   Kailah Pennel, MD     [1]  Current Outpatient Medications:    albuterol  (VENTOLIN  HFA) 108 (90 Base) MCG/ACT inhaler, Inhale 2 puffs into the lungs every 6 (six) hours as needed for wheezing or shortness of breath., Disp: 6.7 g, Rfl: 2   aspirin EC 81 MG tablet, Take 81 mg by mouth daily. Swallow whole., Disp: , Rfl:    Calcium-Magnesium -Vitamin D  600-40-500 MG-MG-UNIT TB24, Take 1 tablet by mouth daily., Disp: , Rfl:    Cholecalciferol (VITAMIN D -3) 1000 UNITS CAPS, Take 1 capsule by mouth daily., Disp: , Rfl:    diazepam  (VALIUM ) 5 MG tablet, Take 1 tablet (5 mg total) by mouth daily as needed for anxiety., Disp: 30 tablet, Rfl: 0   furosemide  (LASIX ) 40 MG tablet, Take 1 tablet (40 mg total) by mouth daily., Disp: 90 tablet, Rfl: 3   Multiple Vitamins-Minerals (CENTRUM SILVER) tablet, Take 1 tablet by mouth daily., Disp: , Rfl:    sodium chloride  (OCEAN) 0.65 % SOLN nasal spray, Place 1 spray into both nostrils., Disp: , Rfl:    spironolactone  (ALDACTONE ) 25 MG tablet, Take 1 tablet (25 mg total) by mouth daily., Disp: 90 tablet, Rfl: 2   vitamin B-12 (CYANOCOBALAMIN ) 1000 MCG tablet, Take 1,000 mcg by mouth daily., Disp: , Rfl:    vitamin C (ASCORBIC ACID) 500 MG tablet, Take 500 mg by mouth daily., Disp: , Rfl:    azelastine  (ASTELIN ) 0.1 % nasal spray, Place 1 spray into both nostrils 2 (two) times daily. Use in each nostril as directed (Patient not taking: Reported on 10/26/2024), Disp: 30 mL, Rfl: 1    budesonide -glycopyrrolate -formoterol  (BREZTRI  AEROSPHERE) 160-9-4.8 MCG/ACT AERO inhaler, Inhale 2 puffs into the lungs in the morning and at bedtime., Disp: 1 each, Rfl: 6   nicotine  (NICODERM CQ  - DOSED IN MG/24 HOURS) 21 mg/24hr patch, Place 1 patch (21 mg total) onto the skin daily. (Patient not taking: Reported on 10/26/2024), Disp: 28 patch, Rfl: 0   promethazine -dextromethorphan  (PROMETHAZINE -DM) 6.25-15 MG/5ML syrup, Take 5 mLs by mouth 4 (four) times daily as needed., Disp: 240 mL, Rfl: 3  "

## 2024-10-27 ENCOUNTER — Telehealth: Admitting: *Deleted

## 2024-10-27 NOTE — Patient Instructions (Signed)
 Visit Information  Thank you for taking time to visit with me today. Please don't hesitate to contact me if I can be of assistance to you before our next scheduled telephone appointment.  It has been a pleasure working with you over the last 30 days!  Great job managing your care after your hospital visit!  I am so glad that you are doing well!  Please do not hesitate to contact me in the future if I can be of assistance to you!  Following are the goals we discussed today:  Patient Self Care Activities:  Attend all scheduled provider appointments Call provider office for new concerns or questions  Take medications as prescribed   eliminate smoking in my home eliminate symptom triggers at home follow rescue plan if symptoms flare-up Continue pacing activity to avoid episodes of shortness of breath Continue to follow your established action plan for episodes of shortness of breath Continue using home oxygen as prescribed/ instructed Please continue weighing yourself every day to stay on top of early fluid retention: write down your weights every day so you remember what it is from day to day: follow the weight-gain guidelines and action plan to call your doctor if you gain more than 3 lbs overnight, or 5 lbs in one week If you believe your condition is getting worse- contact your care providers (doctors) promptly- reaching out to your doctor early when you have concerns can prevent you from having to go to the hospital  If you are experiencing a Mental Health or Behavioral Health Crisis or need someone to talk to, please  call the Suicide and Crisis Lifeline: 988 call the [place]  National Suicide Prevention Lifeline: 410-177-1426 or TTY: 505-707-5260 TTY 559-332-1192) to talk to a trained counselor call 1-800-273-TALK (toll free, 24 hour hotline) go to [facility] [address], [place] [phone]) call the [facility] Crisis Line:  [phone] call 911   Care plan and visit instructions communicated with the patient verbally today. Patient agrees to receive a copy in MyChart. Active MyChart status and patient understanding of how to access instructions and care plan via MyChart confirmed with patient.     Pls call for questions,  Marleah Beever Mckinney Zaide Mcclenahan, RN, BSN, CCRN Alumnus RN Care Manager  Transitions of Care  [facility] - [facility] [phone]: direct office

## 2024-10-27 NOTE — Transitions of Care (Post Inpatient/ED Visit) (Signed)
 " Transition of Care TOC case closure: Follow up call as requested after case closure on 10/06/24  Visit Note  10/27/2024  Name: Stacey [name] MRN: [id number]          DOB: Jan 08, 1956  Situation: Patient enrolled in [facility] 30-day program. Visit completed with patient and her spouse Tim by telephone.   Completed follow TOC 30-day program outreach as requested/ scheduled post- new pulmonary provider office visit 10/26/24:  Reviewed office visit with patient and her spouse- both verbalize excellent understanding of plan of care moving forward without additional prompting and deny questions/ medication concerns; has completed TOC 30-day program without hospital re-admission; denies need for/ declines desire for ongoing care management follow up; confirmed patient has my direct phone number- encouraged to call if needs arise in the future; TOC 30-day program case closure accordingly  Background:  Recent Hospitalization for treatment of COPD Recent hospitalization December 8-17, 2025 for cough/ SOB/ acute on chronic respiratory failure in setting of COPD: new home O2 requirement- order Independent in self care: drives self at baseline; supportive spouse who is able to assist with needs; no assistive devices (1) unplanned hospital admission x last (6)/ (12) months  Initial Transition Care Management Follow-up Telephone Call Discharge Date and Diagnosis: No data recorded   Past Medical History:  Diagnosis Date   Aortic atherosclerosis    Breast cancer (HCC) 09/22/2006   L side, s/p lumpectomy, chemotherapy and radiation   Centrilobular emphysema (HCC)    Chronic combined systolic and diastolic heart failure (HCC)    Edema    Leg swelling    OSA (obstructive sleep apnea)    Panic disorder    SOB (shortness of breath)    Tobacco abuse    Assessment:  The visit went great yesterday with the pulmonary doctor; I am very comfortable with him and he really didn't make any changes at all; using the home  O2 only at night now- no shortness of breath during the day; occasional cough, but not bad and not real frequent.  Using the inhalers.  I am very glad to have made it this far and am really doing great.  I really do not want or need any more phone calls, I know what to do and am doing it.  Still working on not smoking, but only having 2 or 3 cigarettes a day  Patient Reported Symptoms: Cognitive Cognitive Status: Normal speech and language skills, Alert and oriented to person, place, and time, Insightful and able to interpret abstract concepts Cognitive/Intellectual Conditions Management [RPT]: None reported or documented in medical history or problem list      Neurological Neurological Review of Symptoms: No symptoms reported    HEENT HEENT Symptoms Reported: No symptoms reported      Cardiovascular Cardiovascular Symptoms Reported: No symptoms reported Does patient have uncontrolled Hypertension?: No Cardiovascular Management Strategies: Routine screening, Coping strategies, Adequate rest, Weight management, Medication therapy  Respiratory Respiratory Symptoms Reported: No symptoms reported Other Respiratory Symptoms: Confirms continues to use home O2 at night HS only; continues to verbalize without prompting excellent understanding of action plan for episodes shortness of breath/ difference between maintenance ans rescue inhalers/ safe use of home O2; denies new/ recent episodes of shortness of breath; continues to experience occasional- period cough; confirmed attended new pulmonary provider office visit as scheduled 10/26/24 Additional Respiratory Details: Confirmed continues to work on smoking cessation; thoroughly discussed smoking cessation with pulmonary provider during 10/26/24 office visit Respiratory Management Strategies: Oxygen therapy, Asthma action plan,  Adequate rest, Coping strategies, Breathing techniques, Medication therapy, Routine screening  Endocrine Endocrine Symptoms  Reported: No symptoms reported Is patient diabetic?: No    Gastrointestinal Gastrointestinal Symptoms Reported: No symptoms reported      Genitourinary Genitourinary Symptoms Reported: No symptoms reported    Integumentary Integumentary Symptoms Reported: No symptoms reported    Musculoskeletal Musculoskelatal Symptoms Reviewed: No symptoms reported Additional Musculoskeletal Details: confirmed not currently requiring/ using assistive devices for ambulation as per baseline; confirmed no new- recent falls     Fall risk Follow up: Falls prevention discussed  Psychosocial Psychosocial Symptoms Reported: No symptoms reported Behavioral Management Strategies: Support system, Coping strategies Major Change/Loss/Stressor/Fears (CP): Medical condition, self Techniques to Cope with Loss/Stress/Change: Diversional activities Quality of Family Relationships: supportive, involved, helpful   Today's Vitals   10/27/24 1020  SpO2: 94%   Pain Scale: 0-10 Pain Score: 0-No pain  Medications Reviewed Today     Reviewed by Nelline Lio M, RN (Registered Nurse) on 10/27/24 at 1007  Med List Status: <None>   Medication Order Taking? Sig Documenting Provider Last Dose Status Informant  albuterol  (VENTOLIN  HFA) 108 (90 Base) MCG/ACT inhaler [id number]  Inhale 2 puffs into the lungs every 6 (six) hours as needed for wheezing or shortness of breath. Odell Celinda Balo, MD  Active   aspirin EC 81 MG tablet [id number]  Take 81 mg by mouth daily. Swallow whole. [provider]  Active Self, Pharmacy Records  azelastine  (ASTELIN ) 0.1 % nasal spray [id number]  Place 1 spray into both nostrils 2 (two) times daily. Use in each nostril as directed  Patient not taking: Reported on 10/26/2024   Hope Almarie ORN, NP  Active   budesonide -glycopyrrolate -formoterol  (BREZTRI  AEROSPHERE) 160-9-4.8 MCG/ACT AERO inhaler [id number]  Inhale 2 puffs into the lungs in the morning and at bedtime. Catherine Cools, MD   Active   Calcium-Magnesium -Vitamin D  600-40-500 MG-MG-UNIT TB24 [id number]  Take 1 tablet by mouth daily. [provider]  Active Self, Pharmacy Records  Cholecalciferol (VITAMIN D -3) 1000 UNITS CAPS [id number]  Take 1 capsule by mouth daily. [provider]  Active Self, Pharmacy Records  diazepam  (VALIUM ) 5 MG tablet [id number]  Take 1 tablet (5 mg total) by mouth daily as needed for anxiety. Rollene Almarie LABOR, MD  Active Self, Pharmacy Records           Med Note CARLEEN, [place] D   Mon Aug 29, 2024  1:18 PM) Has at home - hasn't had to use in a while.  furosemide  (LASIX ) 40 MG tablet [id number]  Take 1 tablet (40 mg total) by mouth daily. Court Dorn PARAS, MD  Active Self, Pharmacy Records  Multiple Vitamins-Minerals (CENTRUM SILVER) tablet [id number]  Take 1 tablet by mouth daily. [provider]  Active Self, Pharmacy Records  nicotine  (NICODERM CQ  - DOSED IN MG/24 HOURS) 21 mg/24hr patch [id number]  Place 1 patch (21 mg total) onto the skin daily.  Patient not taking: Reported on 10/26/2024   Hope Almarie ORN, NP  Active   promethazine -dextromethorphan  (PROMETHAZINE -DM) 6.25-15 MG/5ML syrup [id number]  Take 5 mLs by mouth 4 (four) times daily as needed. Alghanim, Cools, MD  Active   sodium chloride  (OCEAN) 0.65 % SOLN nasal spray [id number]  Place 1 spray into both nostrils. [provider]  Active   spironolactone  (ALDACTONE ) 25 MG tablet [id number]  Take 1 tablet (25 mg total) by mouth daily. Goodrich, Callie E, PA-C  Active Self, Pharmacy Records  vitamin B-12 (CYANOCOBALAMIN ) 1000 MCG tablet [id number]  Take  1,000 mcg by mouth daily. [provider]  Active Self, Pharmacy Records  vitamin C (ASCORBIC ACID) 500 MG tablet [id number]  Take 500 mg by mouth daily. [provider]  Active Self, Pharmacy Records           Recommendation:   Continue Current Plan of Care  Follow Up Plan:   Closing From:  Transitions of Care Program  I  appreciate the opportunity to participate in Chabeli's care,  Pls call/ message for questions,  Quinnten Calvin Mckinney Dalonda Simoni, RN, BSN, CCRN Alumnus RN Care Manager  Transitions of Care  [facility] - [facility] [phone]: direct office     "

## 2024-10-27 NOTE — Patient Instructions (Signed)
" °  VISIT SUMMARY: Today we discussed your COPD, oxygen use, and respiratory symptoms. You have reduced your daytime oxygen use and are monitoring your oxygen levels at home. We also addressed your persistent cough, smoking habits, and concerns about lung and throat cancer.  YOUR PLAN: CHRONIC OBSTRUCTIVE PULMONARY DISEASE (COPD): You have COPD with a persistent cough and mucus production. Your symptoms are worsened by smoking. -Continue using Breztri  inhaler, 2 puffs in the morning and 2 puffs in the evening. -Rinse your mouth after using the inhaler to prevent thrush. -Try to quit smoking to reduce your COPD symptoms. -A pulmonary function test has been ordered at your current location.  CHRONIC HYPOXIC RESPIRATORY FAILURE: Your oxygenation has improved, and you no longer need daytime oxygen. We are evaluating your nighttime oxygen use due to occasional snoring. -Continue to monitor your oxygen saturation during sleep. -Proceed with the scheduled sleep study to evaluate the need for CPAP or BiPAP. -Continue 1 L O2 by [place] at night  OBSTRUCTIVE SLEEP APNEA: You have moderate obstructive sleep apnea. A previous sleep study was inconclusive due to external noise. -Proceed with the CPAP titration study. -Evaluate the results to determine the need for CPAP or BiPAP.  CIGARETTE NICOTINE  DEPENDENCE: You continue to smoke, though you have significantly reduced your intake. Quitting smoking will improve your health. -Try to quit smoking. -Quitting smoking will reduce your risk of lung cancer and improve your COPD symptoms. -Call 1-800QUITNOW to assist you with smoking cessation  LUNG CANCER SCREENING: You are eligible for lung cancer screening due to your smoking history. Your previous scans are valid until December. -We will schedule your lung cancer screening in December.  Contains text generated by [employer].  "

## 2024-11-02 ENCOUNTER — Encounter

## 2024-11-02 ENCOUNTER — Ambulatory Visit: Admitting: Primary Care

## 2024-11-23 ENCOUNTER — Ambulatory Visit (HOSPITAL_BASED_OUTPATIENT_CLINIC_OR_DEPARTMENT_OTHER): Admitting: Pulmonary Disease

## 2025-02-07 ENCOUNTER — Encounter (HOSPITAL_BASED_OUTPATIENT_CLINIC_OR_DEPARTMENT_OTHER)

## 2025-02-07 ENCOUNTER — Ambulatory Visit (HOSPITAL_BASED_OUTPATIENT_CLINIC_OR_DEPARTMENT_OTHER): Admitting: Pulmonary Disease
